# Patient Record
Sex: Male | Born: 1935 | Race: Black or African American | Hispanic: No | Smoking: Former smoker
Health system: Southern US, Community
[De-identification: ages and names within clinical notes are randomized; demographics above are authoritative.]

## PROBLEM LIST (undated history)

## (undated) DIAGNOSIS — I723 Aneurysm of iliac artery: Secondary | ICD-10-CM

## (undated) DIAGNOSIS — R55 Syncope and collapse: Secondary | ICD-10-CM

## (undated) DIAGNOSIS — J449 Chronic obstructive pulmonary disease, unspecified: Secondary | ICD-10-CM

## (undated) DIAGNOSIS — K219 Gastro-esophageal reflux disease without esophagitis: Secondary | ICD-10-CM

## (undated) DIAGNOSIS — N186 End stage renal disease: Secondary | ICD-10-CM

## (undated) DIAGNOSIS — C801 Malignant (primary) neoplasm, unspecified: Secondary | ICD-10-CM

## (undated) DIAGNOSIS — E785 Hyperlipidemia, unspecified: Secondary | ICD-10-CM

## (undated) DIAGNOSIS — I071 Rheumatic tricuspid insufficiency: Secondary | ICD-10-CM

## (undated) DIAGNOSIS — I10 Essential (primary) hypertension: Secondary | ICD-10-CM

## (undated) DIAGNOSIS — M6282 Rhabdomyolysis: Secondary | ICD-10-CM

## (undated) DIAGNOSIS — N2581 Secondary hyperparathyroidism of renal origin: Secondary | ICD-10-CM

## (undated) DIAGNOSIS — I8290 Acute embolism and thrombosis of unspecified vein: Secondary | ICD-10-CM

## (undated) DIAGNOSIS — E079 Disorder of thyroid, unspecified: Secondary | ICD-10-CM

## (undated) DIAGNOSIS — D649 Anemia, unspecified: Secondary | ICD-10-CM

## (undated) DIAGNOSIS — I469 Cardiac arrest, cause unspecified: Secondary | ICD-10-CM

## (undated) DIAGNOSIS — D693 Immune thrombocytopenic purpura: Secondary | ICD-10-CM

## (undated) DIAGNOSIS — I5022 Chronic systolic (congestive) heart failure: Secondary | ICD-10-CM

## (undated) DIAGNOSIS — Z992 Dependence on renal dialysis: Secondary | ICD-10-CM

## (undated) HISTORY — DX: Gastro-esophageal reflux disease without esophagitis: K21.9

## (undated) HISTORY — DX: Disorder of thyroid, unspecified: E07.9

## (undated) HISTORY — DX: Hyperlipidemia, unspecified: E78.5

## (undated) HISTORY — PX: OTHER SURGICAL HISTORY: SHX169

## (undated) HISTORY — DX: Chronic obstructive pulmonary disease, unspecified: J44.9

## (undated) HISTORY — DX: Rhabdomyolysis: M62.82

## (undated) HISTORY — DX: Anemia, unspecified: D64.9

## (undated) HISTORY — DX: Rheumatic tricuspid insufficiency: I07.1

## (undated) HISTORY — DX: Essential (primary) hypertension: I10

## (undated) HISTORY — DX: Malignant (primary) neoplasm, unspecified: C80.1

## (undated) HISTORY — DX: Chronic systolic (congestive) heart failure: I50.22

## (undated) HISTORY — DX: Acute embolism and thrombosis of unspecified vein: I82.90

## (undated) HISTORY — PX: HERNIA REPAIR: SHX51

## (undated) HISTORY — PX: AV FISTULA PLACEMENT: SHX1204

---

## 1975-01-20 HISTORY — PX: AMPUTATION: SHX166

## 1997-07-16 ENCOUNTER — Emergency Department (HOSPITAL_COMMUNITY): Admission: EM | Admit: 1997-07-16 | Discharge: 1997-07-16 | Payer: Self-pay | Admitting: Emergency Medicine

## 1997-07-19 ENCOUNTER — Encounter: Admission: RE | Admit: 1997-07-19 | Discharge: 1997-07-19 | Payer: Self-pay | Admitting: Internal Medicine

## 1997-07-31 ENCOUNTER — Encounter: Admission: RE | Admit: 1997-07-31 | Discharge: 1997-07-31 | Payer: Self-pay | Admitting: Hematology and Oncology

## 1997-08-08 ENCOUNTER — Ambulatory Visit (HOSPITAL_COMMUNITY): Admission: RE | Admit: 1997-08-08 | Discharge: 1997-08-08 | Payer: Self-pay | Admitting: *Deleted

## 1997-09-14 ENCOUNTER — Emergency Department (HOSPITAL_COMMUNITY): Admission: EM | Admit: 1997-09-14 | Discharge: 1997-09-14 | Payer: Self-pay | Admitting: Emergency Medicine

## 1997-09-18 ENCOUNTER — Encounter: Admission: RE | Admit: 1997-09-18 | Discharge: 1997-09-18 | Payer: Self-pay | Admitting: Internal Medicine

## 1997-11-23 ENCOUNTER — Ambulatory Visit (HOSPITAL_COMMUNITY): Admission: RE | Admit: 1997-11-23 | Discharge: 1997-11-23 | Payer: Self-pay | Admitting: Internal Medicine

## 1997-11-23 ENCOUNTER — Encounter: Admission: RE | Admit: 1997-11-23 | Discharge: 1997-11-23 | Payer: Self-pay | Admitting: Internal Medicine

## 1999-01-23 ENCOUNTER — Inpatient Hospital Stay (HOSPITAL_COMMUNITY): Admission: EM | Admit: 1999-01-23 | Discharge: 1999-01-24 | Payer: Self-pay | Admitting: *Deleted

## 1999-04-13 ENCOUNTER — Emergency Department (HOSPITAL_COMMUNITY): Admission: EM | Admit: 1999-04-13 | Discharge: 1999-04-13 | Payer: Self-pay

## 2001-05-30 ENCOUNTER — Emergency Department (HOSPITAL_COMMUNITY): Admission: EM | Admit: 2001-05-30 | Discharge: 2001-05-31 | Payer: Self-pay | Admitting: Emergency Medicine

## 2001-05-31 ENCOUNTER — Emergency Department (HOSPITAL_COMMUNITY): Admission: EM | Admit: 2001-05-31 | Discharge: 2001-05-31 | Payer: Self-pay | Admitting: Emergency Medicine

## 2001-11-14 ENCOUNTER — Emergency Department (HOSPITAL_COMMUNITY): Admission: EM | Admit: 2001-11-14 | Discharge: 2001-11-14 | Payer: Self-pay | Admitting: Emergency Medicine

## 2002-01-30 ENCOUNTER — Encounter: Payer: Self-pay | Admitting: Nephrology

## 2002-01-30 ENCOUNTER — Encounter: Admission: RE | Admit: 2002-01-30 | Discharge: 2002-01-30 | Payer: Self-pay | Admitting: Nephrology

## 2002-03-24 ENCOUNTER — Encounter (INDEPENDENT_AMBULATORY_CARE_PROVIDER_SITE_OTHER): Payer: Self-pay | Admitting: *Deleted

## 2002-03-24 ENCOUNTER — Ambulatory Visit (HOSPITAL_COMMUNITY): Admission: RE | Admit: 2002-03-24 | Discharge: 2002-03-25 | Payer: Self-pay

## 2002-04-02 ENCOUNTER — Emergency Department (HOSPITAL_COMMUNITY): Admission: EM | Admit: 2002-04-02 | Discharge: 2002-04-02 | Payer: Self-pay | Admitting: Emergency Medicine

## 2002-04-03 ENCOUNTER — Emergency Department (HOSPITAL_COMMUNITY): Admission: EM | Admit: 2002-04-03 | Discharge: 2002-04-03 | Payer: Self-pay | Admitting: Emergency Medicine

## 2002-06-07 ENCOUNTER — Emergency Department (HOSPITAL_COMMUNITY): Admission: EM | Admit: 2002-06-07 | Discharge: 2002-06-07 | Payer: Self-pay | Admitting: Emergency Medicine

## 2003-02-26 ENCOUNTER — Encounter: Admission: RE | Admit: 2003-02-26 | Discharge: 2003-05-27 | Payer: Self-pay | Admitting: Nephrology

## 2003-08-17 ENCOUNTER — Ambulatory Visit (HOSPITAL_COMMUNITY): Admission: RE | Admit: 2003-08-17 | Discharge: 2003-08-17 | Payer: Self-pay | Admitting: Vascular Surgery

## 2003-08-17 ENCOUNTER — Emergency Department (HOSPITAL_COMMUNITY): Admission: EM | Admit: 2003-08-17 | Discharge: 2003-08-17 | Payer: Self-pay | Admitting: Family Medicine

## 2003-08-22 ENCOUNTER — Ambulatory Visit (HOSPITAL_COMMUNITY): Admission: RE | Admit: 2003-08-22 | Discharge: 2003-08-22 | Payer: Self-pay | Admitting: Vascular Surgery

## 2003-10-19 ENCOUNTER — Emergency Department (HOSPITAL_COMMUNITY): Admission: EM | Admit: 2003-10-19 | Discharge: 2003-10-19 | Payer: Self-pay | Admitting: Emergency Medicine

## 2003-12-04 ENCOUNTER — Ambulatory Visit: Payer: Self-pay | Admitting: Gastroenterology

## 2003-12-19 ENCOUNTER — Ambulatory Visit: Payer: Self-pay | Admitting: Gastroenterology

## 2003-12-24 ENCOUNTER — Ambulatory Visit: Payer: Self-pay | Admitting: Gastroenterology

## 2004-02-24 ENCOUNTER — Inpatient Hospital Stay (HOSPITAL_COMMUNITY): Admission: EM | Admit: 2004-02-24 | Discharge: 2004-02-26 | Payer: Self-pay | Admitting: Emergency Medicine

## 2004-03-04 ENCOUNTER — Ambulatory Visit: Payer: Self-pay | Admitting: Gastroenterology

## 2004-04-08 ENCOUNTER — Emergency Department (HOSPITAL_COMMUNITY): Admission: EM | Admit: 2004-04-08 | Discharge: 2004-04-08 | Payer: Self-pay | Admitting: Emergency Medicine

## 2004-07-04 ENCOUNTER — Ambulatory Visit: Payer: Self-pay | Admitting: Gastroenterology

## 2005-03-24 ENCOUNTER — Emergency Department (HOSPITAL_COMMUNITY): Admission: EM | Admit: 2005-03-24 | Discharge: 2005-03-24 | Payer: Self-pay | Admitting: Family Medicine

## 2005-04-28 ENCOUNTER — Encounter: Admission: RE | Admit: 2005-04-28 | Discharge: 2005-04-28 | Payer: Self-pay | Admitting: Internal Medicine

## 2005-07-02 ENCOUNTER — Encounter (HOSPITAL_COMMUNITY): Admission: RE | Admit: 2005-07-02 | Discharge: 2005-09-30 | Payer: Self-pay | Admitting: Nephrology

## 2005-07-21 ENCOUNTER — Emergency Department (HOSPITAL_COMMUNITY): Admission: EM | Admit: 2005-07-21 | Discharge: 2005-07-21 | Payer: Self-pay | Admitting: Emergency Medicine

## 2005-08-04 ENCOUNTER — Encounter: Admission: RE | Admit: 2005-08-04 | Discharge: 2005-08-04 | Payer: Self-pay | Admitting: Internal Medicine

## 2005-09-10 ENCOUNTER — Emergency Department (HOSPITAL_COMMUNITY): Admission: EM | Admit: 2005-09-10 | Discharge: 2005-09-10 | Payer: Self-pay | Admitting: Family Medicine

## 2005-11-10 ENCOUNTER — Inpatient Hospital Stay (HOSPITAL_COMMUNITY): Admission: EM | Admit: 2005-11-10 | Discharge: 2005-11-12 | Payer: Self-pay | Admitting: Emergency Medicine

## 2005-11-11 ENCOUNTER — Encounter (INDEPENDENT_AMBULATORY_CARE_PROVIDER_SITE_OTHER): Payer: Self-pay | Admitting: Specialist

## 2005-11-18 ENCOUNTER — Emergency Department (HOSPITAL_COMMUNITY): Admission: EM | Admit: 2005-11-18 | Discharge: 2005-11-18 | Payer: Self-pay | Admitting: Emergency Medicine

## 2005-12-08 ENCOUNTER — Emergency Department (HOSPITAL_COMMUNITY): Admission: EM | Admit: 2005-12-08 | Discharge: 2005-12-08 | Payer: Self-pay | Admitting: Family Medicine

## 2005-12-09 ENCOUNTER — Emergency Department (HOSPITAL_COMMUNITY): Admission: EM | Admit: 2005-12-09 | Discharge: 2005-12-09 | Payer: Self-pay | Admitting: Family Medicine

## 2005-12-15 ENCOUNTER — Ambulatory Visit: Payer: Self-pay | Admitting: Gastroenterology

## 2005-12-22 ENCOUNTER — Ambulatory Visit: Payer: Self-pay | Admitting: Gastroenterology

## 2006-02-04 ENCOUNTER — Ambulatory Visit: Payer: Self-pay | Admitting: Gastroenterology

## 2006-05-06 ENCOUNTER — Emergency Department (HOSPITAL_COMMUNITY): Admission: EM | Admit: 2006-05-06 | Discharge: 2006-05-06 | Payer: Self-pay | Admitting: Family Medicine

## 2006-12-03 ENCOUNTER — Emergency Department (HOSPITAL_COMMUNITY): Admission: EM | Admit: 2006-12-03 | Discharge: 2006-12-03 | Payer: Self-pay | Admitting: Family Medicine

## 2006-12-07 ENCOUNTER — Ambulatory Visit (HOSPITAL_COMMUNITY): Admission: RE | Admit: 2006-12-07 | Discharge: 2006-12-07 | Payer: Self-pay | Admitting: Nephrology

## 2006-12-22 ENCOUNTER — Emergency Department (HOSPITAL_COMMUNITY): Admission: EM | Admit: 2006-12-22 | Discharge: 2006-12-22 | Payer: Self-pay | Admitting: Emergency Medicine

## 2006-12-24 ENCOUNTER — Emergency Department (HOSPITAL_COMMUNITY): Admission: EM | Admit: 2006-12-24 | Discharge: 2006-12-24 | Payer: Self-pay | Admitting: Emergency Medicine

## 2007-01-20 ENCOUNTER — Emergency Department (HOSPITAL_COMMUNITY): Admission: EM | Admit: 2007-01-20 | Discharge: 2007-01-20 | Payer: Self-pay | Admitting: Emergency Medicine

## 2007-03-01 ENCOUNTER — Ambulatory Visit: Payer: Self-pay | Admitting: Dentistry

## 2007-03-01 ENCOUNTER — Encounter: Admission: EM | Admit: 2007-03-01 | Discharge: 2007-03-01 | Payer: Self-pay | Admitting: Dentistry

## 2007-03-17 ENCOUNTER — Ambulatory Visit: Payer: Self-pay | Admitting: Cardiovascular Disease

## 2007-04-28 ENCOUNTER — Encounter: Payer: Self-pay | Admitting: Cardiovascular Disease

## 2007-04-28 ENCOUNTER — Ambulatory Visit: Payer: Self-pay

## 2007-05-05 ENCOUNTER — Ambulatory Visit: Payer: Self-pay | Admitting: Cardiology

## 2007-05-05 ENCOUNTER — Inpatient Hospital Stay (HOSPITAL_COMMUNITY): Admission: EM | Admit: 2007-05-05 | Discharge: 2007-05-08 | Payer: Self-pay | Admitting: Emergency Medicine

## 2007-05-06 ENCOUNTER — Encounter (INDEPENDENT_AMBULATORY_CARE_PROVIDER_SITE_OTHER): Payer: Self-pay | Admitting: Internal Medicine

## 2007-05-06 ENCOUNTER — Ambulatory Visit: Payer: Self-pay | Admitting: Vascular Surgery

## 2007-05-25 ENCOUNTER — Ambulatory Visit: Payer: Self-pay | Admitting: Internal Medicine

## 2007-07-01 ENCOUNTER — Ambulatory Visit: Payer: Self-pay | Admitting: Cardiovascular Disease

## 2007-08-15 ENCOUNTER — Emergency Department (HOSPITAL_COMMUNITY): Admission: EM | Admit: 2007-08-15 | Discharge: 2007-08-15 | Payer: Self-pay | Admitting: Emergency Medicine

## 2007-10-04 ENCOUNTER — Observation Stay (HOSPITAL_COMMUNITY): Admission: EM | Admit: 2007-10-04 | Discharge: 2007-10-05 | Payer: Self-pay | Admitting: Emergency Medicine

## 2007-11-27 ENCOUNTER — Emergency Department (HOSPITAL_COMMUNITY): Admission: EM | Admit: 2007-11-27 | Discharge: 2007-11-27 | Payer: Self-pay | Admitting: Emergency Medicine

## 2007-12-04 ENCOUNTER — Emergency Department (HOSPITAL_COMMUNITY): Admission: EM | Admit: 2007-12-04 | Discharge: 2007-12-04 | Payer: Self-pay | Admitting: Family Medicine

## 2007-12-09 ENCOUNTER — Ambulatory Visit: Payer: Self-pay | Admitting: Cardiology

## 2007-12-09 DIAGNOSIS — I469 Cardiac arrest, cause unspecified: Secondary | ICD-10-CM

## 2007-12-09 HISTORY — DX: Cardiac arrest, cause unspecified: I46.9

## 2007-12-10 ENCOUNTER — Inpatient Hospital Stay (HOSPITAL_COMMUNITY): Admission: EM | Admit: 2007-12-10 | Discharge: 2007-12-18 | Payer: Self-pay | Admitting: *Deleted

## 2007-12-10 ENCOUNTER — Ambulatory Visit: Payer: Self-pay | Admitting: Internal Medicine

## 2007-12-10 ENCOUNTER — Ambulatory Visit: Payer: Self-pay | Admitting: Cardiology

## 2007-12-11 ENCOUNTER — Encounter (INDEPENDENT_AMBULATORY_CARE_PROVIDER_SITE_OTHER): Payer: Self-pay | Admitting: Internal Medicine

## 2007-12-12 ENCOUNTER — Encounter (INDEPENDENT_AMBULATORY_CARE_PROVIDER_SITE_OTHER): Payer: Self-pay | Admitting: Internal Medicine

## 2007-12-13 ENCOUNTER — Ambulatory Visit: Payer: Self-pay | Admitting: Surgery

## 2007-12-19 ENCOUNTER — Ambulatory Visit (HOSPITAL_COMMUNITY): Admission: RE | Admit: 2007-12-19 | Discharge: 2007-12-19 | Payer: Self-pay | Admitting: Urology

## 2008-01-07 ENCOUNTER — Inpatient Hospital Stay (HOSPITAL_COMMUNITY): Admission: EM | Admit: 2008-01-07 | Discharge: 2008-01-10 | Payer: Self-pay | Admitting: Emergency Medicine

## 2008-01-26 ENCOUNTER — Emergency Department (HOSPITAL_COMMUNITY): Admission: EM | Admit: 2008-01-26 | Discharge: 2008-01-26 | Payer: Self-pay | Admitting: Emergency Medicine

## 2008-02-10 ENCOUNTER — Ambulatory Visit: Payer: Self-pay | Admitting: Cardiovascular Disease

## 2008-02-15 ENCOUNTER — Ambulatory Visit (HOSPITAL_COMMUNITY): Admission: RE | Admit: 2008-02-15 | Discharge: 2008-02-15 | Payer: Self-pay | Admitting: Nephrology

## 2008-03-21 ENCOUNTER — Ambulatory Visit: Payer: Self-pay | Admitting: Cardiovascular Disease

## 2008-05-28 ENCOUNTER — Telehealth: Payer: Self-pay | Admitting: Cardiovascular Disease

## 2008-10-09 ENCOUNTER — Ambulatory Visit: Payer: Self-pay | Admitting: Internal Medicine

## 2008-10-09 ENCOUNTER — Inpatient Hospital Stay (HOSPITAL_COMMUNITY): Admission: EM | Admit: 2008-10-09 | Discharge: 2008-10-13 | Payer: Self-pay | Admitting: Emergency Medicine

## 2008-10-12 ENCOUNTER — Encounter (INDEPENDENT_AMBULATORY_CARE_PROVIDER_SITE_OTHER): Payer: Self-pay | Admitting: Internal Medicine

## 2008-11-16 ENCOUNTER — Ambulatory Visit (HOSPITAL_COMMUNITY): Admission: RE | Admit: 2008-11-16 | Discharge: 2008-11-16 | Payer: Self-pay | Admitting: Nephrology

## 2008-11-25 ENCOUNTER — Emergency Department (HOSPITAL_COMMUNITY): Admission: EM | Admit: 2008-11-25 | Discharge: 2008-11-25 | Payer: Self-pay | Admitting: Family Medicine

## 2008-12-11 ENCOUNTER — Emergency Department (HOSPITAL_COMMUNITY): Admission: EM | Admit: 2008-12-11 | Discharge: 2008-12-11 | Payer: Self-pay | Admitting: Emergency Medicine

## 2008-12-11 ENCOUNTER — Ambulatory Visit: Payer: Self-pay | Admitting: Vascular Surgery

## 2009-02-22 ENCOUNTER — Ambulatory Visit: Payer: Self-pay | Admitting: Vascular Surgery

## 2009-02-27 ENCOUNTER — Ambulatory Visit (HOSPITAL_COMMUNITY): Admission: RE | Admit: 2009-02-27 | Discharge: 2009-02-27 | Payer: Self-pay | Admitting: Vascular Surgery

## 2009-03-21 ENCOUNTER — Ambulatory Visit: Payer: Self-pay | Admitting: Vascular Surgery

## 2009-03-31 ENCOUNTER — Emergency Department (HOSPITAL_COMMUNITY): Admission: EM | Admit: 2009-03-31 | Discharge: 2009-03-31 | Payer: Self-pay | Admitting: Emergency Medicine

## 2009-07-04 ENCOUNTER — Encounter: Payer: Self-pay | Admitting: Cardiovascular Disease

## 2009-07-08 ENCOUNTER — Ambulatory Visit (HOSPITAL_COMMUNITY): Admission: RE | Admit: 2009-07-08 | Discharge: 2009-07-08 | Payer: Self-pay | Admitting: Nephrology

## 2009-07-11 ENCOUNTER — Encounter: Payer: Self-pay | Admitting: Cardiovascular Disease

## 2009-07-15 ENCOUNTER — Encounter: Payer: Self-pay | Admitting: Cardiovascular Disease

## 2009-07-19 ENCOUNTER — Encounter: Payer: Self-pay | Admitting: Cardiovascular Disease

## 2009-07-26 ENCOUNTER — Emergency Department (HOSPITAL_COMMUNITY): Admission: EM | Admit: 2009-07-26 | Discharge: 2009-07-26 | Payer: Self-pay | Admitting: Emergency Medicine

## 2009-09-06 ENCOUNTER — Ambulatory Visit: Payer: Self-pay | Admitting: Cardiovascular Disease

## 2009-09-06 DIAGNOSIS — I5022 Chronic systolic (congestive) heart failure: Secondary | ICD-10-CM

## 2009-09-06 HISTORY — DX: Chronic systolic (congestive) heart failure: I50.22

## 2009-09-10 ENCOUNTER — Emergency Department (HOSPITAL_COMMUNITY): Admission: EM | Admit: 2009-09-10 | Discharge: 2009-09-10 | Payer: Self-pay | Admitting: Emergency Medicine

## 2009-09-13 ENCOUNTER — Encounter: Admission: RE | Admit: 2009-09-13 | Discharge: 2009-09-13 | Payer: Self-pay | Admitting: Sports Medicine

## 2009-10-08 ENCOUNTER — Ambulatory Visit: Payer: Self-pay | Admitting: Vascular Surgery

## 2009-10-19 HISTORY — PX: OTHER SURGICAL HISTORY: SHX169

## 2009-10-21 ENCOUNTER — Encounter: Admission: RE | Admit: 2009-10-21 | Discharge: 2009-10-21 | Payer: Self-pay | Admitting: Vascular Surgery

## 2009-10-24 ENCOUNTER — Emergency Department (HOSPITAL_COMMUNITY): Admission: EM | Admit: 2009-10-24 | Discharge: 2009-10-24 | Payer: Self-pay | Admitting: Emergency Medicine

## 2009-10-30 ENCOUNTER — Ambulatory Visit (HOSPITAL_COMMUNITY): Admission: RE | Admit: 2009-10-30 | Discharge: 2009-10-30 | Payer: Self-pay | Admitting: Surgery

## 2009-11-02 ENCOUNTER — Ambulatory Visit: Payer: Self-pay | Admitting: Cardiovascular Disease

## 2009-11-02 ENCOUNTER — Observation Stay (HOSPITAL_COMMUNITY): Admission: EM | Admit: 2009-11-02 | Discharge: 2009-11-04 | Payer: Self-pay | Admitting: Emergency Medicine

## 2009-11-03 ENCOUNTER — Encounter (INDEPENDENT_AMBULATORY_CARE_PROVIDER_SITE_OTHER): Payer: Self-pay | Admitting: Internal Medicine

## 2009-11-06 ENCOUNTER — Inpatient Hospital Stay (HOSPITAL_COMMUNITY): Admission: RE | Admit: 2009-11-06 | Discharge: 2009-11-07 | Payer: Self-pay | Admitting: Vascular Surgery

## 2009-11-06 ENCOUNTER — Ambulatory Visit: Payer: Self-pay | Admitting: Vascular Surgery

## 2009-11-22 ENCOUNTER — Ambulatory Visit (HOSPITAL_COMMUNITY): Admission: RE | Admit: 2009-11-22 | Discharge: 2009-11-22 | Payer: Self-pay | Admitting: Nephrology

## 2009-12-09 ENCOUNTER — Encounter: Admission: RE | Admit: 2009-12-09 | Discharge: 2009-12-09 | Payer: Self-pay | Admitting: Vascular Surgery

## 2009-12-10 ENCOUNTER — Ambulatory Visit: Payer: Self-pay | Admitting: Vascular Surgery

## 2009-12-12 ENCOUNTER — Emergency Department (HOSPITAL_COMMUNITY): Admission: EM | Admit: 2009-12-12 | Discharge: 2009-12-12 | Payer: Self-pay | Admitting: Emergency Medicine

## 2009-12-18 ENCOUNTER — Ambulatory Visit: Payer: Self-pay | Admitting: Vascular Surgery

## 2009-12-23 ENCOUNTER — Inpatient Hospital Stay (HOSPITAL_COMMUNITY)
Admission: EM | Admit: 2009-12-23 | Discharge: 2009-12-27 | Payer: Self-pay | Source: Home / Self Care | Attending: Internal Medicine | Admitting: Internal Medicine

## 2010-01-03 ENCOUNTER — Ambulatory Visit (HOSPITAL_COMMUNITY): Admission: RE | Admit: 2010-01-03 | Payer: Self-pay | Source: Home / Self Care | Admitting: Vascular Surgery

## 2010-01-24 ENCOUNTER — Encounter
Admission: RE | Admit: 2010-01-24 | Discharge: 2010-01-24 | Payer: Self-pay | Source: Home / Self Care | Attending: Nephrology | Admitting: Nephrology

## 2010-02-04 ENCOUNTER — Ambulatory Visit
Admission: RE | Admit: 2010-02-04 | Discharge: 2010-02-04 | Payer: Self-pay | Source: Home / Self Care | Attending: Vascular Surgery | Admitting: Vascular Surgery

## 2010-02-09 ENCOUNTER — Encounter: Payer: Self-pay | Admitting: Nephrology

## 2010-02-09 ENCOUNTER — Encounter: Payer: Self-pay | Admitting: Urology

## 2010-02-18 ENCOUNTER — Ambulatory Visit
Admission: RE | Admit: 2010-02-18 | Discharge: 2010-02-18 | Payer: Self-pay | Source: Home / Self Care | Attending: Vascular Surgery | Admitting: Vascular Surgery

## 2010-02-18 NOTE — Letter (Signed)
Summary: [facility] Hemodialysis Progress Note   [facility] Hemodialysis Progress Note   Imported By: Roderic Ovens 09/13/2009 12:00:18  _____________________________________________________________________  External Attachment:    Type:   Image     Comment:   External Document

## 2010-02-18 NOTE — Assessment & Plan Note (Signed)
Summary: f1y per pt call/lg   Visit Type:  Follow-up  CC:  none.  History of Present Illness: 77 year-old gentleman with ESRD, multiple comorbidities, presents for follow-up evaluation of cardiomyopathy with LVEF less than 30%. He does have a history of ventricular tachycardia occuring in the setting of marked hyperkalemia.   He is doing well at present. The patient denies chest pain, dyspnea, orthopnea, PND, edema, palpitations, lightheadedness, or syncope. He maintains a garden and a reasonably active lifestyle. He denies syncope in the past 12 months. He states that he is tolerating hemodialysis without problems.   Current Medications (verified): 1)  Coreg 12.5 Mg Tabs (Carvedilol) .... Take 1 Tablet By Mouth Two Times A Day 2)  Isosorbide Mononitrate Cr 30 Mg Xr24h-Tab (Isosorbide Mononitrate) .... Take One Tablet By Mouth Daily 3)  Omeprazole 40 Mg Cpdr (Omeprazole) .... Take 1 Tab By Mouth At Bedtime 4)  Proscar 5 Mg Tabs (Finasteride) .... Take 1 Tablet By Mouth Two Times A Day 5)  Thorazine 25mg  .... As Needed  Hiccups. 6)  Rena-Vite  Tabs (B Complex-C-Folic Acid) .... Take 1 Tablet By Mouth Once A Day 7)  Aspirin 81 Mg Tbec (Aspirin) .... Take One Tablet By Mouth Daily  Allergies (verified): No Known Drug Allergies  Past History:  Past medical history reviewed for relevance to current acute and chronic problems.  Past Medical History: Reviewed history from 09/04/2009 and no changes required.  1. Rhabdomyolysis secondary to blunt trauma to the buttocks area after       a fall.   2. End-stage renal disease, on hemodialysis Tuesday, Thursday,       Saturdays.   3. Ischemic cardiomyopathy, ejection fraction 25%-30%.   4. Essential hypertension.   5. Hyperlipidemia.   6. Secondary hyperparathyroidism.   7. Anemia of chronic disease.   8. Gout.   9. Mild tricuspid regurgitation.   10.History of rectal bleeding.   11.History of apical thrombus.   12.History of  hyperkalemic related cardiac arrest.   13.Left arteriovenous fistula.   Review of Systems       Negative except as per HPI   Vital Signs:  Patient profile:   77 year old male Height:      67 inches Weight:      155 pounds BMI:     24.36 Pulse rate:   70 / minute Resp:     16 per minute BP sitting:   118 / 78  (right arm)  Vitals Entered By: Marrion Coy, CNA (September 06, 2009 9:55 AM)  Physical Exam  General:  Pt is alert and oriented, elderly, African-American male in no acute distress. HEENT: normal Neck: normal carotid upstrokes without bruits, JVP normal Lungs: CTA CV: RRR without murmur or gallop Abd: soft, NT, positive BS, no bruit, no organomegaly Ext: no clubbing, cyanosis, or edema. peripheral pulses 2+ and equal Skin: warm and dry without rash    EKG  Procedure date:  09/06/2009  Findings:      NSR with first degree AVB, age-indeterminate inferior and anterior infarcts, PVC. HR 70 bpm.  Impression & Recommendations:  Problem # 1:  CHRONIC SYSTOLIC HEART FAILURE (ICD-428.22) Pt is stable from a CV perspective. He has severe LV dysfunction, with an absence of cardiac symptoms at present (Class I). I suspect he would be symptomatic if activity level was higher. With ESRD and at age 35, I would continue with medical therapy using carvedilol/isosorbide and volume control with hemodialysis. I'll see him back in one  year for follow-up evaluation.  His updated medication list for this problem includes:    Coreg 12.5 Mg Tabs (Carvedilol) .Marland Kitchen... Take 1 tablet by mouth two times a day    Isosorbide Mononitrate Cr 30 Mg Xr24h-tab (Isosorbide mononitrate) .Marland Kitchen... Take one tablet by mouth daily    Aspirin 81 Mg Tbec (Aspirin) .Marland Kitchen... Take one tablet by mouth daily  Orders: EKG w/ Interpretation (93000)  Patient Instructions: 1)  Your physician recommends that you schedule a follow-up appointment in: 12 months with Dr Excell Seltzer 2)  Your physician recommends that you continue  on your current medications as directed. Please refer to the Current Medication list given to you today.

## 2010-02-18 NOTE — Letter (Signed)
Summary: [facility] Problem List   [facility] Problem List   Imported By: Roderic Ovens 09/13/2009 11:56:22  _____________________________________________________________________  External Attachment:    Type:   Image     Comment:   External Document

## 2010-02-19 NOTE — Assessment & Plan Note (Signed)
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  I ligated the fistula in the forearm on December 6, and we created a left upper arm fistula which has matured nicely.  It has an excellent pulse and palpable thrill.  I did evacuate by compression a bunch of old laminated clot and debris from the distal most aneurysm in the forearm today.  The more proximal one has no ulceration and is intact.  I think that this should drain some old blood and then eventually hopefully this will heal without requiring a trip to the operating room to excise this area.  I discussed this the patient. He is in agreement with this plan.  We have reapplied a compression dressing today which will be changed in his dialysis sessions.  I cautioned him that it might ooze a little dark blood but not to be concerned about this but to keep a compression dressing in place.  He will return in 4 weeks for me to see this unless he develops any chills, fever or evidence of infection.    [name], M.D. Electronically Signed  JDL/MEDQ  D:  02/18/2010  T:  02/19/2010  Job:  [id number]

## 2010-03-18 ENCOUNTER — Ambulatory Visit (INDEPENDENT_AMBULATORY_CARE_PROVIDER_SITE_OTHER): Payer: Medicare Other | Admitting: Vascular Surgery

## 2010-03-18 DIAGNOSIS — N186 End stage renal disease: Secondary | ICD-10-CM

## 2010-03-19 NOTE — Assessment & Plan Note (Signed)
OFFICE VISIT  Jackson [name], Jackson [name] DOB:  March 23, 1928                                       03/18/2010 NWGNF#:[id number]  The patient had ligation of a left forearm AV fistula for pseudoaneurysm erosion and then creation of a left upper arm AV fistula performed on December 6 for end-stage renal disease.  The left upper arm fistula has been utilized for the last few weeks and is working well.  The wound in the forearm is continuing to stay dry and clean and he is treating this with just local wound care daily.  He has no symptoms of numbness or pain in the left hand.  On exam, the upper arm fistula has an excellent pulse palpable thrill, and the area in the mid forearm where there was a pseudoaneurysm has had an eschar overlying it measuring about 5 mm in diameter, which is clean and uninfected.  I have encouraged him to continue to keep this clean and the eschar should separate soon.  If he has any further concerns about the wound, he will be in touch with [place] for further follow-up.    [name], M.D. Electronically Signed  JDL/MEDQ  D:  03/18/2010  T:  03/19/2010  Job:  [id number]

## 2010-03-27 ENCOUNTER — Other Ambulatory Visit: Payer: Self-pay | Admitting: Nephrology

## 2010-03-27 ENCOUNTER — Ambulatory Visit
Admission: RE | Admit: 2010-03-27 | Discharge: 2010-03-27 | Disposition: A | Discharge status: Home / Self Care | Payer: Medicare Other | Source: Ambulatory Visit | Attending: Nephrology | Admitting: Nephrology

## 2010-03-27 DIAGNOSIS — I509 Heart failure, unspecified: Secondary | ICD-10-CM

## 2010-03-28 ENCOUNTER — Other Ambulatory Visit (HOSPITAL_COMMUNITY): Payer: Self-pay | Admitting: Nephrology

## 2010-03-28 DIAGNOSIS — N186 End stage renal disease: Secondary | ICD-10-CM

## 2010-03-31 LAB — CROSSMATCH
Antibody Screen: NEGATIVE
Unit division: 0
Unit division: 0
Unit division: 0

## 2010-03-31 LAB — CBC
HCT: 20.1 % — ABNORMAL LOW (ref 39.0–52.0)
HCT: 25.3 % — ABNORMAL LOW (ref 39.0–52.0)
MCH: 29.7 pg (ref 26.0–34.0)
MCH: 30.3 pg (ref 26.0–34.0)
MCH: 30.6 pg (ref 26.0–34.0)
MCH: 30.8 pg (ref 26.0–34.0)
MCHC: 31.3 g/dL (ref 30.0–36.0)
MCHC: 32.8 g/dL (ref 30.0–36.0)
MCHC: 33.9 g/dL (ref 30.0–36.0)
MCV: 92.2 fL (ref 78.0–100.0)
MCV: 92.2 fL (ref 78.0–100.0)
MCV: 92.3 fL (ref 78.0–100.0)
MCV: 96.2 fL (ref 78.0–100.0)
Platelets: 55 10*3/uL — ABNORMAL LOW (ref 150–400)
Platelets: 55 10*3/uL — ABNORMAL LOW (ref 150–400)
Platelets: 58 10*3/uL — ABNORMAL LOW (ref 150–400)
Platelets: 65 10*3/uL — ABNORMAL LOW (ref 150–400)
Platelets: 72 10*3/uL — ABNORMAL LOW (ref 150–400)
Platelets: 84 10*3/uL — ABNORMAL LOW (ref 150–400)
Platelets: 88 10*3/uL — ABNORMAL LOW (ref 150–400)
RBC: 2.63 MIL/uL — ABNORMAL LOW (ref 4.22–5.81)
RDW: 17.3 % — ABNORMAL HIGH (ref 11.5–15.5)
RDW: 17.8 % — ABNORMAL HIGH (ref 11.5–15.5)
RDW: 18 % — ABNORMAL HIGH (ref 11.5–15.5)
RDW: 18.1 % — ABNORMAL HIGH (ref 11.5–15.5)
RDW: 18.1 % — ABNORMAL HIGH (ref 11.5–15.5)
RDW: 20.9 % — ABNORMAL HIGH (ref 11.5–15.5)
WBC: 4.6 10*3/uL (ref 4.0–10.5)
WBC: 5 10*3/uL (ref 4.0–10.5)
WBC: 5.2 10*3/uL (ref 4.0–10.5)
WBC: 5.6 10*3/uL (ref 4.0–10.5)

## 2010-03-31 LAB — BASIC METABOLIC PANEL
BUN: 10 mg/dL (ref 6–23)
BUN: 13 mg/dL (ref 6–23)
BUN: 19 mg/dL (ref 6–23)
CO2: 26 mEq/L (ref 19–32)
Calcium: 7.9 mg/dL — ABNORMAL LOW (ref 8.4–10.5)
Chloride: 100 mEq/L (ref 96–112)
Chloride: 99 mEq/L (ref 96–112)
Creatinine, Ser: 4.77 mg/dL — ABNORMAL HIGH (ref 0.4–1.5)
Creatinine, Ser: 7.33 mg/dL — ABNORMAL HIGH (ref 0.4–1.5)
Creatinine, Ser: 7.35 mg/dL — ABNORMAL HIGH (ref 0.4–1.5)
GFR calc Af Amer: 9 mL/min — ABNORMAL LOW (ref >60)
GFR calc non Af Amer: 12 mL/min — ABNORMAL LOW (ref >60)
Potassium: 4 mEq/L (ref 3.5–5.1)

## 2010-03-31 LAB — DIFFERENTIAL
Basophils Absolute: 0.1 10*3/uL (ref 0.0–0.1)
Eosinophils Absolute: 0.2 10*3/uL (ref 0.0–0.7)
Eosinophils Absolute: 0.2 10*3/uL (ref 0.0–0.7)
Eosinophils Relative: 3 % (ref 0–5)
Lymphocytes Relative: 22 % (ref 12–46)
Lymphocytes Relative: 25 % (ref 12–46)
Lymphs Abs: 1.3 10*3/uL (ref 0.7–4.0)
Lymphs Abs: 1.6 10*3/uL (ref 0.7–4.0)
Monocytes Absolute: 0.7 10*3/uL (ref 0.1–1.0)
Neutro Abs: 2.9 10*3/uL (ref 1.7–7.7)

## 2010-03-31 LAB — PROTIME-INR: INR: 1.22 (ref 0.00–1.49)

## 2010-03-31 LAB — MRSA PCR SCREENING: MRSA by PCR: NEGATIVE

## 2010-03-31 LAB — RENAL FUNCTION PANEL
Albumin: 2.1 g/dL — ABNORMAL LOW (ref 3.5–5.2)
Albumin: 2.3 g/dL — ABNORMAL LOW (ref 3.5–5.2)
BUN: 14 mg/dL (ref 6–23)
CO2: 26 mEq/L (ref 19–32)
Calcium: 7.7 mg/dL — ABNORMAL LOW (ref 8.4–10.5)
Calcium: 8.2 mg/dL — ABNORMAL LOW (ref 8.4–10.5)
Creatinine, Ser: 9.33 mg/dL — ABNORMAL HIGH (ref 0.4–1.5)
GFR calc Af Amer: 7 mL/min — ABNORMAL LOW (ref >60)
GFR calc non Af Amer: 5 mL/min — ABNORMAL LOW (ref >60)
Glucose, Bld: 92 mg/dL (ref 70–99)
Phosphorus: 1.6 mg/dL — ABNORMAL LOW (ref 2.3–4.6)
Phosphorus: 2.5 mg/dL (ref 2.3–4.6)

## 2010-03-31 LAB — POCT I-STAT, CHEM 8
BUN: 14 mg/dL (ref 6–23)
Calcium, Ion: 1.13 mmol/L (ref 1.12–1.32)
Creatinine, Ser: 6.9 mg/dL — ABNORMAL HIGH (ref 0.4–1.5)
Glucose, Bld: 97 mg/dL (ref 70–99)
Hemoglobin: 9.5 g/dL — ABNORMAL LOW (ref 13.0–17.0)
Sodium: 137 mEq/L (ref 135–145)
TCO2: 30 mmol/L (ref 0–100)

## 2010-03-31 LAB — POTASSIUM: Potassium: 4 mEq/L (ref 3.5–5.1)

## 2010-04-01 LAB — CBC
HCT: 31.1 % — ABNORMAL LOW (ref 39.0–52.0)
Hemoglobin: 9.9 g/dL — ABNORMAL LOW (ref 13.0–17.0)
RDW: 20 % — ABNORMAL HIGH (ref 11.5–15.5)
WBC: 7.4 10*3/uL (ref 4.0–10.5)

## 2010-04-01 LAB — POCT CARDIAC MARKERS: Troponin i, poc: <0.05 ng/mL (ref 0.00–0.09)

## 2010-04-01 LAB — DIFFERENTIAL
Basophils Absolute: 0 10*3/uL (ref 0.0–0.1)
Eosinophils Relative: 1 % (ref 0–5)
Lymphocytes Relative: 20 % (ref 12–46)
Monocytes Absolute: 0.8 10*3/uL (ref 0.1–1.0)

## 2010-04-01 LAB — BASIC METABOLIC PANEL
GFR calc non Af Amer: 7 mL/min — ABNORMAL LOW (ref >60)
Potassium: 3.3 mEq/L — ABNORMAL LOW (ref 3.5–5.1)
Sodium: 134 mEq/L — ABNORMAL LOW (ref 135–145)

## 2010-04-02 LAB — CBC
HCT: 24 % — ABNORMAL LOW (ref 39.0–52.0)
HCT: 28.1 % — ABNORMAL LOW (ref 39.0–52.0)
HCT: 28.4 % — ABNORMAL LOW (ref 39.0–52.0)
Hemoglobin: 7.3 g/dL — ABNORMAL LOW (ref 13.0–17.0)
Hemoglobin: 8 g/dL — ABNORMAL LOW (ref 13.0–17.0)
Hemoglobin: 8.6 g/dL — ABNORMAL LOW (ref 13.0–17.0)
Hemoglobin: 9.3 g/dL — ABNORMAL LOW (ref 13.0–17.0)
MCH: 30.7 pg (ref 26.0–34.0)
MCH: 31 pg (ref 26.0–34.0)
MCHC: 33.1 g/dL (ref 30.0–36.0)
MCHC: 33.3 g/dL (ref 30.0–36.0)
MCV: 92.2 fL (ref 78.0–100.0)
MCV: 93 fL (ref 78.0–100.0)
MCV: 95.9 fL (ref 78.0–100.0)
Platelets: 114 10*3/uL — ABNORMAL LOW (ref 150–400)
Platelets: 129 10*3/uL — ABNORMAL LOW (ref 150–400)
RBC: 2.31 MIL/uL — ABNORMAL LOW (ref 4.22–5.81)
RBC: 2.58 MIL/uL — ABNORMAL LOW (ref 4.22–5.81)
RBC: 2.73 MIL/uL — ABNORMAL LOW (ref 4.22–5.81)
RBC: 2.96 MIL/uL — ABNORMAL LOW (ref 4.22–5.81)
RBC: 3.03 MIL/uL — ABNORMAL LOW (ref 4.22–5.81)
RDW: 16.3 % — ABNORMAL HIGH (ref 11.5–15.5)
WBC: 4.1 10*3/uL (ref 4.0–10.5)
WBC: 4.6 10*3/uL (ref 4.0–10.5)
WBC: 9.1 10*3/uL (ref 4.0–10.5)

## 2010-04-02 LAB — PROTIME-INR: Prothrombin Time: 13.3 seconds (ref 11.6–15.2)

## 2010-04-02 LAB — COMPREHENSIVE METABOLIC PANEL
ALT: 8 U/L (ref 0–53)
ALT: <8 U/L (ref 0–53)
AST: 13 U/L (ref 0–37)
AST: 13 U/L (ref 0–37)
Albumin: 2.8 g/dL — ABNORMAL LOW (ref 3.5–5.2)
CO2: 29 mEq/L (ref 19–32)
CO2: 33 mEq/L — ABNORMAL HIGH (ref 19–32)
Calcium: 8.3 mg/dL — ABNORMAL LOW (ref 8.4–10.5)
Calcium: 8.6 mg/dL (ref 8.4–10.5)
Chloride: 93 mEq/L — ABNORMAL LOW (ref 96–112)
Creatinine, Ser: 6.47 mg/dL — ABNORMAL HIGH (ref 0.4–1.5)
GFR calc Af Amer: 10 mL/min — ABNORMAL LOW (ref >60)
GFR calc Af Amer: 13 mL/min — ABNORMAL LOW (ref >60)
GFR calc non Af Amer: 11 mL/min — ABNORMAL LOW (ref >60)
GFR calc non Af Amer: 8 mL/min — ABNORMAL LOW (ref >60)
Glucose, Bld: 86 mg/dL (ref 70–99)
Sodium: 138 mEq/L (ref 135–145)
Total Bilirubin: 0.4 mg/dL (ref 0.3–1.2)

## 2010-04-02 LAB — POCT I-STAT, CHEM 8
BUN: 11 mg/dL (ref 6–23)
BUN: 30 mg/dL — ABNORMAL HIGH (ref 6–23)
Calcium, Ion: 0.9 mmol/L — ABNORMAL LOW (ref 1.12–1.32)
Chloride: 99 mEq/L (ref 96–112)
Chloride: 97 mEq/L (ref 96–112)
Creatinine, Ser: 4.2 mg/dL — ABNORMAL HIGH (ref 0.4–1.5)
Creatinine, Ser: 5.8 mg/dL — ABNORMAL HIGH (ref 0.4–1.5)
Glucose, Bld: 100 mg/dL — ABNORMAL HIGH (ref 70–99)
Sodium: 138 mEq/L (ref 135–145)

## 2010-04-02 LAB — RENAL FUNCTION PANEL
Albumin: 2.3 g/dL — ABNORMAL LOW (ref 3.5–5.2)
Albumin: 2.5 g/dL — ABNORMAL LOW (ref 3.5–5.2)
BUN: 30 mg/dL — ABNORMAL HIGH (ref 6–23)
CO2: 31 mEq/L (ref 19–32)
Chloride: 94 mEq/L — ABNORMAL LOW (ref 96–112)
Chloride: 97 mEq/L (ref 96–112)
Creatinine, Ser: 7.26 mg/dL — ABNORMAL HIGH (ref 0.4–1.5)
Glucose, Bld: 108 mg/dL — ABNORMAL HIGH (ref 70–99)
Glucose, Bld: 92 mg/dL (ref 70–99)
Phosphorus: 3.1 mg/dL (ref 2.3–4.6)
Potassium: 4.5 mEq/L (ref 3.5–5.1)
Potassium: 5.3 mEq/L — ABNORMAL HIGH (ref 3.5–5.1)
Sodium: 132 mEq/L — ABNORMAL LOW (ref 135–145)

## 2010-04-02 LAB — POCT CARDIAC MARKERS
CKMB, poc: <1 ng/mL — ABNORMAL LOW (ref 1.0–8.0)
Troponin i, poc: <0.05 ng/mL (ref 0.00–0.09)
Troponin i, poc: <0.05 ng/mL (ref 0.00–0.09)

## 2010-04-02 LAB — CROSSMATCH: Unit division: 0

## 2010-04-02 LAB — LIPID PANEL
Cholesterol: 146 mg/dL (ref 0–200)
HDL: 54 mg/dL (ref >39)
LDL Cholesterol: 77 mg/dL (ref 0–99)
Total CHOL/HDL Ratio: 2.7 RATIO
Triglycerides: 76 mg/dL (ref <150)

## 2010-04-02 LAB — URINE MICROSCOPIC-ADD ON

## 2010-04-02 LAB — URINALYSIS, ROUTINE W REFLEX MICROSCOPIC
Glucose, UA: NEGATIVE mg/dL
Nitrite: NEGATIVE
Specific Gravity, Urine: 1.012 (ref 1.005–1.030)
pH: 8.5 — ABNORMAL HIGH (ref 5.0–8.0)

## 2010-04-02 LAB — BASIC METABOLIC PANEL
BUN: 10 mg/dL (ref 6–23)
Chloride: 97 mEq/L (ref 96–112)
Potassium: 3.4 mEq/L — ABNORMAL LOW (ref 3.5–5.1)

## 2010-04-02 LAB — DIFFERENTIAL
Basophils Relative: 0 % (ref 0–1)
Eosinophils Relative: 1 % (ref 0–5)
Lymphs Abs: 1.2 10*3/uL (ref 0.7–4.0)
Monocytes Relative: 12 % (ref 3–12)
Neutro Abs: 6.7 10*3/uL (ref 1.7–7.7)

## 2010-04-02 LAB — CARDIAC PANEL(CRET KIN+CKTOT+MB+TROPI)
CK, MB: 0.8 ng/mL (ref 0.3–4.0)
Relative Index: INVALID (ref 0.0–2.5)
Relative Index: INVALID (ref 0.0–2.5)
Total CK: 30 U/L (ref 7–232)

## 2010-04-02 LAB — APTT: aPTT: 27 seconds (ref 24–37)

## 2010-04-02 LAB — TSH: TSH: 1.655 u[IU]/mL (ref 0.350–4.500)

## 2010-04-04 ENCOUNTER — Ambulatory Visit (HOSPITAL_COMMUNITY): Payer: Medicare Other

## 2010-04-09 ENCOUNTER — Ambulatory Visit (HOSPITAL_COMMUNITY): Payer: Medicare Other | Attending: Nephrology

## 2010-04-09 ENCOUNTER — Ambulatory Visit (HOSPITAL_COMMUNITY): Payer: Medicare Other

## 2010-04-11 ENCOUNTER — Ambulatory Visit (HOSPITAL_COMMUNITY)
Admission: RE | Admit: 2010-04-11 | Discharge: 2010-04-11 | Disposition: A | Discharge status: Home / Self Care | Payer: Medicare Other | Source: Ambulatory Visit | Attending: Nephrology | Admitting: Nephrology

## 2010-04-11 DIAGNOSIS — J449 Chronic obstructive pulmonary disease, unspecified: Secondary | ICD-10-CM | POA: Insufficient documentation

## 2010-04-11 DIAGNOSIS — J4489 Other specified chronic obstructive pulmonary disease: Secondary | ICD-10-CM | POA: Insufficient documentation

## 2010-04-11 DIAGNOSIS — K219 Gastro-esophageal reflux disease without esophagitis: Secondary | ICD-10-CM | POA: Insufficient documentation

## 2010-04-11 DIAGNOSIS — N186 End stage renal disease: Secondary | ICD-10-CM | POA: Insufficient documentation

## 2010-04-11 DIAGNOSIS — I120 Hypertensive chronic kidney disease with stage 5 chronic kidney disease or end stage renal disease: Secondary | ICD-10-CM | POA: Insufficient documentation

## 2010-04-11 DIAGNOSIS — N2581 Secondary hyperparathyroidism of renal origin: Secondary | ICD-10-CM | POA: Insufficient documentation

## 2010-04-11 DIAGNOSIS — D649 Anemia, unspecified: Secondary | ICD-10-CM | POA: Insufficient documentation

## 2010-04-11 MED ORDER — IOHEXOL 300 MG/ML  SOLN
100.0000 mL | Freq: Once | INTRAMUSCULAR | Status: AC | PRN
  Administered 2010-04-11: 34 mL via INTRAVENOUS

## 2010-04-23 LAB — POCT I-STAT, CHEM 8
BUN: 12 mg/dL (ref 6–23)
Creatinine, Ser: 4.6 mg/dL — ABNORMAL HIGH (ref 0.4–1.5)
Potassium: 4.3 mEq/L (ref 3.5–5.1)
Sodium: 135 mEq/L (ref 135–145)

## 2010-04-25 ENCOUNTER — Other Ambulatory Visit (HOSPITAL_COMMUNITY): Payer: Self-pay | Admitting: Nephrology

## 2010-04-25 DIAGNOSIS — N186 End stage renal disease: Secondary | ICD-10-CM

## 2010-04-25 LAB — COMPREHENSIVE METABOLIC PANEL
ALT: 39 U/L (ref 0–53)
BUN: 57 mg/dL — ABNORMAL HIGH (ref 6–23)
CO2: 28 mEq/L (ref 19–32)
Calcium: 8.8 mg/dL (ref 8.4–10.5)
Creatinine, Ser: 10.63 mg/dL — ABNORMAL HIGH (ref 0.4–1.5)
GFR calc non Af Amer: 5 mL/min — ABNORMAL LOW (ref >60)
Glucose, Bld: 102 mg/dL — ABNORMAL HIGH (ref 70–99)

## 2010-04-25 LAB — BASIC METABOLIC PANEL
CO2: 27 mEq/L (ref 19–32)
CO2: 28 mEq/L (ref 19–32)
CO2: 29 mEq/L (ref 19–32)
Calcium: 8.2 mg/dL — ABNORMAL LOW (ref 8.4–10.5)
Calcium: 8.4 mg/dL (ref 8.4–10.5)
Chloride: 88 mEq/L — ABNORMAL LOW (ref 96–112)
Chloride: 88 mEq/L — ABNORMAL LOW (ref 96–112)
Creatinine, Ser: 11.02 mg/dL — ABNORMAL HIGH (ref 0.4–1.5)
Creatinine, Ser: 6.12 mg/dL — ABNORMAL HIGH (ref 0.4–1.5)
Creatinine, Ser: 7.72 mg/dL — ABNORMAL HIGH (ref 0.4–1.5)
GFR calc Af Amer: 11 mL/min — ABNORMAL LOW (ref >60)
GFR calc Af Amer: 5 mL/min — ABNORMAL LOW (ref >60)
GFR calc Af Amer: 5 mL/min — ABNORMAL LOW (ref >60)
GFR calc non Af Amer: 9 mL/min — ABNORMAL LOW (ref >60)
Glucose, Bld: 79 mg/dL (ref 70–99)
Glucose, Bld: 99 mg/dL (ref 70–99)
Sodium: 132 mEq/L — ABNORMAL LOW (ref 135–145)
Sodium: 136 mEq/L (ref 135–145)

## 2010-04-25 LAB — CBC
HCT: 40.3 % (ref 39.0–52.0)
Hemoglobin: 10.9 g/dL — ABNORMAL LOW (ref 13.0–17.0)
Hemoglobin: 11.8 g/dL — ABNORMAL LOW (ref 13.0–17.0)
Hemoglobin: 13.3 g/dL (ref 13.0–17.0)
MCHC: 32.7 g/dL (ref 30.0–36.0)
MCHC: 32.9 g/dL (ref 30.0–36.0)
MCHC: 34.6 g/dL (ref 30.0–36.0)
MCV: 91.4 fL (ref 78.0–100.0)
MCV: 91.6 fL (ref 78.0–100.0)
RBC: 4.4 MIL/uL (ref 4.22–5.81)
RDW: 18.7 % — ABNORMAL HIGH (ref 11.5–15.5)
RDW: 19.3 % — ABNORMAL HIGH (ref 11.5–15.5)

## 2010-04-25 LAB — CARDIAC PANEL(CRET KIN+CKTOT+MB+TROPI)
CK, MB: 12.2 ng/mL — ABNORMAL HIGH (ref 0.3–4.0)
CK, MB: 7.5 ng/mL — ABNORMAL HIGH (ref 0.3–4.0)
Relative Index: 0.1 (ref 0.0–2.5)
Total CK: 4056 U/L — ABNORMAL HIGH (ref 7–232)
Total CK: 5467 U/L — ABNORMAL HIGH (ref 7–232)

## 2010-04-25 LAB — POCT CARDIAC MARKERS

## 2010-04-25 LAB — RENAL FUNCTION PANEL
Albumin: 2.1 g/dL — ABNORMAL LOW (ref 3.5–5.2)
BUN: 41 mg/dL — ABNORMAL HIGH (ref 6–23)
Chloride: 89 mEq/L — ABNORMAL LOW (ref 96–112)
Potassium: 3.7 mEq/L (ref 3.5–5.1)
Sodium: 127 mEq/L — ABNORMAL LOW (ref 135–145)

## 2010-04-25 LAB — CULTURE, BLOOD (ROUTINE X 2): Culture: NO GROWTH

## 2010-04-25 LAB — DIFFERENTIAL
Basophils Relative: 0 % (ref 0–1)
Eosinophils Absolute: 0 10*3/uL (ref 0.0–0.7)
Eosinophils Absolute: 0.1 10*3/uL (ref 0.0–0.7)
Lymphocytes Relative: 15 % (ref 12–46)
Lymphs Abs: 1 10*3/uL (ref 0.7–4.0)
Monocytes Absolute: 0.5 10*3/uL (ref 0.1–1.0)
Neutro Abs: 6.4 10*3/uL (ref 1.7–7.7)
Neutrophils Relative %: 75 % (ref 43–77)

## 2010-04-25 LAB — CK TOTAL AND CKMB (NOT AT ARMC)
CK, MB: 44.2 ng/mL — ABNORMAL HIGH (ref 0.3–4.0)
Total CK: 9207 U/L — ABNORMAL HIGH (ref 7–232)

## 2010-04-25 LAB — PHOSPHORUS: Phosphorus: 3.7 mg/dL (ref 2.3–4.6)

## 2010-04-25 LAB — BLOOD GAS, ARTERIAL
Acid-Base Excess: 2.2 mmol/L — ABNORMAL HIGH (ref 0.0–2.0)
Bicarbonate: 25.6 mEq/L — ABNORMAL HIGH (ref 20.0–24.0)
TCO2: 26.7 mmol/L (ref 0–100)
pCO2 arterial: 35.8 mmHg (ref 35.0–45.0)
pO2, Arterial: 69 mmHg — ABNORMAL LOW (ref 80.0–100.0)

## 2010-04-25 LAB — LACTIC ACID, PLASMA: Lactic Acid, Venous: 2.6 mmol/L — ABNORMAL HIGH (ref 0.5–2.2)

## 2010-04-25 LAB — MAGNESIUM: Magnesium: 2.1 mg/dL (ref 1.5–2.5)

## 2010-04-30 ENCOUNTER — Ambulatory Visit (HOSPITAL_COMMUNITY)
Admission: RE | Admit: 2010-04-30 | Discharge: 2010-04-30 | Disposition: A | Discharge status: Home / Self Care | Payer: Medicare Other | Source: Ambulatory Visit | Attending: Nephrology | Admitting: Nephrology

## 2010-04-30 DIAGNOSIS — Z452 Encounter for adjustment and management of vascular access device: Secondary | ICD-10-CM | POA: Insufficient documentation

## 2010-04-30 DIAGNOSIS — N186 End stage renal disease: Secondary | ICD-10-CM

## 2010-04-30 DIAGNOSIS — N19 Unspecified kidney failure: Secondary | ICD-10-CM | POA: Insufficient documentation

## 2010-05-01 ENCOUNTER — Other Ambulatory Visit: Payer: Self-pay | Admitting: Vascular Surgery

## 2010-05-01 DIAGNOSIS — I723 Aneurysm of iliac artery: Secondary | ICD-10-CM

## 2010-05-05 LAB — URINALYSIS, ROUTINE W REFLEX MICROSCOPIC
Nitrite: NEGATIVE
Specific Gravity, Urine: 1.013 (ref 1.005–1.030)
Urobilinogen, UA: 0.2 mg/dL (ref 0.0–1.0)
pH: 7.5 (ref 5.0–8.0)

## 2010-05-05 LAB — GLUCOSE, CAPILLARY: Glucose-Capillary: 69 mg/dL — ABNORMAL LOW (ref 70–99)

## 2010-05-05 LAB — POCT I-STAT, CHEM 8
Chloride: 94 mEq/L — ABNORMAL LOW (ref 96–112)
Creatinine, Ser: 5.3 mg/dL — ABNORMAL HIGH (ref 0.4–1.5)
Glucose, Bld: 118 mg/dL — ABNORMAL HIGH (ref 70–99)
HCT: 45 % (ref 39.0–52.0)
Potassium: 4.4 mEq/L (ref 3.5–5.1)
Sodium: 139 mEq/L (ref 135–145)

## 2010-05-05 LAB — DIFFERENTIAL
Basophils Absolute: 0 10*3/uL (ref 0.0–0.1)
Lymphocytes Relative: 22 % (ref 12–46)
Monocytes Absolute: 0.8 10*3/uL (ref 0.1–1.0)
Neutro Abs: 4 10*3/uL (ref 1.7–7.7)
Neutrophils Relative %: 62 % (ref 43–77)

## 2010-05-05 LAB — CBC
Hemoglobin: 13.4 g/dL (ref 13.0–17.0)
Platelets: 140 10*3/uL — ABNORMAL LOW (ref 150–400)
RDW: 19.8 % — ABNORMAL HIGH (ref 11.5–15.5)

## 2010-05-05 LAB — URINE MICROSCOPIC-ADD ON

## 2010-06-03 NOTE — H&P (Signed)
NAME:  DANNIEL, TONES NO.:  [id number]   MEDICAL RECORD NO.:  [id number]         PATIENT TYPE:  INP   LOCATION:  6703                         FACILITY:  [facility]   PHYSICIAN:  Hind I Elsaid, MD      DATE OF BIRTH:  03/12/1928   DATE OF ADMISSION:  05/05/2007  DATE OF DISCHARGE:                              HISTORY & PHYSICAL   PRIMARY CARE PHYSICIAN:  Ralene Ok, MD   CHIEF COMPLAINT:  Syncope.   HISTORY OF PRESENT ILLNESS:  This is a 77 year old male, a poor  historian, African American male with multiple medical problems  including end-stage renal disease, on hemodialysis and history of  dilated cardiomyopathy with ejection fraction of 25% and history of  hypertension.  Apparently, the patient was in a good state of health.  He had dialysis yesterday.  The patient drove self to clean clothes to  the laundry, he returned home.  According to him, he felt at home, he  cannot really tell if he had lost conscious or not, but according to  him, he found himself on the floor.  The patient is unsure what exactly  happened.  He felt better after that and then the patient drove back to  get his clothes.  He was sitting on the chair in the laundry and had a  syncopal episode witnessed by the workers at [employer].  The patient  denies any seizure activity.  He complained of mild chest tightness, but  denies any shortness of breath.  Also, the patient complained of left  shoulder pain, as he cannot lift his left shoulder above 45 degrees.  The patient admitted he had this left shoulder pain after the dialysis.  The patient denies any nausea or vomiting.  Denies any sweating.  Denies  any shortness of breath.  Denies any headache.  Denies any blurring of  vision.  In the emergency room, the patient was alert and oriented, and  I could not get any further history from him.  In any circumstances, the  patient found to have a systolic blood pressure around 86/70 sitting,  coming to 93/65, and finally improved to 101/66.   PAST MEDICAL HISTORY:  1. End-stage renal disease on hemodialysis mainly Wednesday, Monday,      and Friday.  The patient has an AV fistula in his left arm.  2. Hypertension.  3. COPD.  4. Secondary hyperparathyroidism.  5. History of congestive heart failure with ejection fraction      approximately around 25% on last echo done on April 9.  6. History of hypercholesterolemia.  7. Status post right hand repair surgery in March 2004.  8. History of peptic stricture.  9. History of gastroesophageal reflux disease.  10.History of anemia of chronic disease.  11.History of left apical thrombus.   MEDICATIONS:  The patient cannot remember his medications at this time.   ALLERGIES:  No known drug allergies.   FAMILY HISTORY:  Noncontributory.   SOCIAL HISTORY:  The patient denies smoking, denies alcohol abuse, and  denies any IV drug abuse.  Lives at home by himself,  has 5 children,  lives near him.   REVIEW OF SYSTEMS:  The patient denies fatigue.  Denies fever, sweating,  or chills.  Eyes, denies any blurring of vision.  Denies sore throat.  Denies cough.  Denies epistaxis.  Denies abdominal pain.  Denies any  nausea or vomiting.  During conversation, complained of some chest  tightness.   PHYSICAL EXAMINATION:  GENERAL:  Alert and pleasant gentleman, not in  obvious respiratory distress.  VITAL SIGNS:  Blood pressure first time was 86/70, pulse rate 59,  respiratory rate 18, and saturation 92%.  HEENT:  Normocephalic and atraumatic.  Pupils are senile miosis.  Ears,  nose, and throat normal.  Mucosa is clear.  Some missing teeth.  NECK:  No lymphadenopathy.  HEART:  S1 and S2 with no added murmur or gallop.  LUNGS:  Normal vesicular breathing with equal air entry.  ABDOMEN:  Soft and nontender.  Bowel sounds positive.  EXTREMITIES:  No lower edema.  Peripheral pulses intact.  CNS:  The patient is alert and oriented x3 with no  focal neurological  finding.  Joints have some limitation of the left shoulder joint, has a  left AV graft on his left arm, which seems working.   BLOOD WORKUP:  He had white blood cells of 6.6, hemoglobin 15.3,  hematocrit 44.9, and platelet count 154.  Potassium 4.9.   Chest x-ray increased opacity in the right lower lung, this likely  represents chronic atelectasis; however, cannot exclude severe embolus  and infiltrate.   CT of the head without contrast, atrophy, microvascular white matter  disease and old infarct.  No definitive  acute or focal abnormality.   ASSESSMENT AND PLAN:  1. A 77 year old [place] American male admitted with 2 episodes of      syncope, found to have systolic blood pressure around 86.  Syncope      possibility secondary to hypotension, cannot exclude any arrhythmia      on aberration to his history of ejection fraction of 25%.  The      patient will be admitted to telemetry.  Recycle cardiac enzymes and      repeat 2D echo.  We will get orthostatic blood pressure, carotid      duplex, and MRI of the brain will be ordered.  We will hold any      blood pressure medication at this time.  During conversation, the      patient complained of some chest tightness.  We will cycle cardiac      enzyme.  We will consider consulting cardiology.  2. End-stage renal disease, to resume hemodialysis.  3. Hypertension.  We will hold all his blood pressure medications      today.  4. History of congestive heart failure.  Same compensated at this      time.  5. Deep vein thrombosis and gastrointestinal prophylaxis.  Medication      to be reconciled.      Hind Bosie Helper, MD  Electronically Signed    HIE/MEDQ  D:  05/05/2007  T:  05/06/2007  Job:  [id number]

## 2010-06-03 NOTE — Discharge Summary (Signed)
NAME:  Jackson [name], Jackson [name] NO.:  [id number]   MEDICAL RECORD NO.:  [id number]          PATIENT TYPE:  INP   LOCATION:  6713                         FACILITY:  [facility]   PHYSICIAN:  Maree Krabbe, M.D.DATE OF BIRTH:  February 21, 1928   DATE OF ADMISSION:  12/09/2007  DATE OF DISCHARGE:  12/18/2007                               DISCHARGE SUMMARY   ADMITTING DIAGNOSES:  1. Severe hyperkalemia greater than 7.5.  2. Status post cardiac arrest secondary to severe hyperkalemia.  3. Ventilator-dependent respiratory failure secondary to status post      cardiac arrest.  4. End-stage renal disease with missed dialysis day prior to      admission.  5. Hypertension.  6. Chronic obstructive pulmonary disease.  7. Ischemic cardiomyopathy with an ejection fraction estimated at 25-      30%.  8. Anemia of chronic disease.  9. Secondary hyperparathyroidism.   DISCHARGE DIAGNOSES:  1. Severe hyperkalemia, corrected with dialysis.  2. Status post cardiac arrest secondary to severe hyperkalemia,      recovered.  3. Ventilator-dependent respiratory failure secondary to status post      cardiac arrest, resolved.  4. End-stage renal disease, on chronic hemodialysis.  5. Hypertension.  6. Chronic obstructive pulmonary disease and chronic bronchitis.  7. Ischemic cardiomyopathy with an ejection fraction of 25-35%.  8. Status post percutaneous angioplasty left arm arteriovenous fistula      and left subclavian vein on December 13, 2007, Dr. Durene Cal.  9. Possible aspiration pneumonia with bronchospasms improved with      steroid, bronchodilators, and antibiotics.  10.Left kidney mass, for followup MRI in 6 months.  11.Elevated prostate surface antigen with followup with Neurology as      outpatient.  12.Anemia of chronic disease.  13.Secondary hyperparathyroidism.   BRIEF HISTORY:  A 77 year old black male with end-stage renal disease on  chronic hemodialysis, Tuesday, Thursday,  Saturday at [facility], missed dialysis from the day prior to admission and was  dialyzed last 3 days ago.  EMS was called to his house for left-sided  weakness and they initially treated him as Code Stroke.  He presented to  the emergency room and according to the ER staff, the patient was  talking appropriate but had a left-sided weakness.  After the CT of the  head was completed, his QRS complex widened and he suffered an acute  cardiopulmonary arrest.  Initial rhythm PEA with a widened QRS.  The  patient was resuscitated after about 15 minutes of CPR with return of  pulse and blood pressure.  During that time, he was treated with usual  ACLS medications and was also treated empirically for presumed  hyperkalemia including insulin, glucose, calcium, and sodium  bicarbonate.  He had a good response in terms of his telemetry QRS  tracing with narrowing of the QRS complex.  Post arrest, EKG showed  junctional rhythm.  The QRS complex showed an intraventricular  conduction delay which was similar to the previous QRS from 2 months  ago.  The patient was unresponsive and unable to provide history and no  family was present at time of admission.   ADMISSION LABORATORY DATA:  Sodium 130, potassium greater than 7.5  having been drawn approximately 15-20 minutes pre arrest, BUN 85, and  creatinine 9.9.  Hemoglobin 13, white count 5000, and platelets 134,000.  Albumin 3.4.  LFTs within normal limits.  Troponin 0.09.  CK-MB 4.1.  Chest x-ray shows no infiltrate or CHF but marked cardiomegaly.  There  was a questionable nodular opacity in the right upper lung.  EKG post  arrest shows a junctional rhythm and rate of 62.   HOSPITAL COURSE:  1. Cardiac arrest.  The patient underwent emergency dialysis after      being intubated and placed on the ventilator.  His potassium was      brought down to within normal limits with dialysis.  Nasogastric      Coreg and Imdur were used  along with as needed IV labetalol.      Cardiac enzymes were essentially negative.  Cardiology was      consulted due to his severe cardiomyopathy.  [facility] saw      the patient whose recommendations were to keep the patient on beta      blocker due to his risk of ventricular tachycardia.  He was having      nonsustained V-tach at the hospital, longest run being 7 beats.  He      was asymptomatic.  He is well-known to [facility]      who felt that he has had short runs of V-tach in the hospital      before.  Ultimately, it was decided that the patient was not a      candidate for an ICD device.  Coreg was started and titrated up to      12.5 mg b.i.d. at time of discharge which he was tolerating well.      He remained in sinus rhythm with first-degree AV block on the      monitor.  Blood pressures were approximately 120/70 at a weight of      61 kg with heart rates in the 70s and 80s.  Followup with [facility] was recommended at time of discharge in approximately 4-      6 weeks with Dr. Excell Seltzer.  2. End-stage renal disease.  The patient was dialyzed 3 times a week,      as usual Tuesday, Thursday, Saturday schedule after his initial      emergency dialysis.  Aranesp was dosed and maintained a hemoglobin      of 11-12.  Hectorol was also dosed with calcium levels      approximately 8 and phosphorus 3.7.  Fistulogram was done on his      left upper fistula, December 13, 2007 by Dr. Myra Gianotti with      angioplasty of the fistula in the left subclavian vein.  [facility].  Dialysis will be      Tuesday, Thursday, Saturday at the [facility].  3. Left kidney mass and elevated PSA.  Incidental finding was that of      a left kidney upper pole mass measuring 1.3 cm of the exophytic.      It measured 1.3 cm upper pole left kidney.  PSA returned at 59.48.      Urology was consulted.  Dr. Patsi Sears  reviewed all data and felt  that the MRI can be repeated in 6 months and no intervention at      this time.  A prostate biopsy will be arranged as an outpatient      through Dr. Imelda Pillow office.  Nuclear bone scan was also      scheduled for the day after discharge and it will be done on      December 19, 2007 as an outpatient.  4. Aspiration pneumonia with bronchospasm.  Upon admission, the      patient's blood and urine culture were cultured and he was      empirically placed on vancomycin and Zosyn.  He remained afebrile.      His white blood cell count within normal limits and blood cultures      remained all negative.  Vancomycin and Zosyn were stopped after a 4-      day course.  A chest x-ray on admission showed a suspicion from      right lung nodule.  However, CAT scan of the chest showed no      nodules whatsoever.  He did have mild COPD, chronic bronchitis, and      cardiomegaly.  Due to his swallowing study, dysphagia 3 diet was      implemented.  He responded favorably with one dose of Solu-Medrol      and Sterapred back course and Augmentin 875 mg daily for 10 days.      Chest x-ray on discharge showed no infiltrate and no edema.   DISCHARGE MEDICATIONS:  1. Coreg 12.5 mg b.i.d., hold before dialysis.  2. Nephro-Vite vitamin 1 daily.  3. Imdur 30 mg at bedtime.  4. EPO 3000 units IV each dialysis.  5. Hectorol 4.0 mcg IV each dialysis.  New dry weight of 61 kg.  6. Augmentin 875 mg 1 every evening for 7 more days.  7. Prednisone Dosepak.  8. Sterapred 10 mg pills, take as directed over the course with 6-8      taper.      Zenovia Jordan, P.A.      Maree Krabbe, M.D.  Electronically Signed    RRK/MEDQ  D:  03/04/2008  T:  03/05/2008  Job:  [id number]

## 2010-06-03 NOTE — Assessment & Plan Note (Signed)
[facility]                            CARDIOLOGY OFFICE NOTE   NAME:Dauphinais, JAFET WISSING                     MRN:          [id number]  DATE:05/25/2007                            DOB:          [birth year]    Mr. Granzow is a 77 year old African American male patient who Dr. Excell Seltzer  was seeing for preop clearance for dental surgery and he had undergone 2-  D echo and stress Cardiolite.  2-D echo on April 28, 2007, showed  ejection fraction to be 25% with diffuse left ventricular hypokinesis  and regional wall motion abnormalities.  He had trivial AI, mild MR,  moderate tricuspid valvular regurgitation and his right atrium was  mildly dilated.  The Cardiolite scan showed moderate inferior wall  infarction from apex to base with no ischemia, ejection fraction 30%.  In the interim the patient ended up having two episodes of syncope and  was admitted to the hospital.  It was felt probably due to hypotension  as he stood up after falling asleep and passed out.  He is a very vague  historian so it is difficult to get a full history.  While on telemetry  he did have seven beats of nonsustained V-tach but no fatal arrhythmias.  His blood pressure medicines were started at low dose and he was  discharged home.  Since the patient has been home he has had no  recurrent syncope, dizziness, shortness of breath.  He is confused about  his medications and I do not believe he is taking them properly.  I  spoke with Dr. Excell Seltzer today, who felt that the patient could have his  teeth pulled but he is at high risk for a general anesthesia so he could  have this done as long this is not performed under general anesthesia.   CURRENT MEDICATIONS:  The patient does not have his Imdur.  He thinks he  is taking carvedilol 25 mg at bedtime, chlorpromazine 25 mg t.i.d. for  hiccups and Renavite daily.   PHYSICAL EXAM:  This is an elderly 78 year old African American male in  no acute  distress.  Blood pressure 100/60, pulse 72, weight 142.  NECK:  Without JVD, HJR, bruit or thyroid enlargement.  LUNGS:  Decreased breath sounds but clear anterior, posterior and  lateral.  HEART:  Regular rate and rhythm at 60 beats per minute, normal S1 and  S2.  A 1/6 systolic ejection murmur at the left sternal border.  ABDOMEN:  Soft without organomegaly, masses, lesions or abnormal  tenderness.  EXTREMITIES:  Without cyanosis, clubbing or edema.  He has good distal  pulses.   IMPRESSION:  1. Ischemic cardiomyopathy, ejection fraction 25-30%.  2. Dobutamine Cardiolite on April 28, 2007, showed moderate inferior      wall infarct, no ischemia, EF 30%, EF 25% on echo.  3. End-stage renal disease on hemodialysis.  4. Recent syncope felt probably related to orthostatic hypotension but      cannot rule out arrhythmia.  5. Hypertension.  6. Hyperparathyroidism secondary to end-stage renal disease.  7. Chronic obstructive pulmonary disease.  8. Anemia.  9. Hypercholesterolemia.  10.Status post right hernia repair.  11.Gastroesophageal reflux disease.   PLAN AT THIS TIME:  I have given the patient a prescription for Imdur 30  mg daily.  He is also given a prescription for Coreg 6.25 mg t.i.d.  except on dialysis days he should not take this and wrote this out for  him.  He can go ahead and have his teeth pulled as long as he does not  undergo general anesthesia.  He can see Dr. Excell Seltzer back in one month.      Jacolyn Reedy, PA-C  Electronically Signed      Veverly Fells. Excell Seltzer, MD  Electronically Signed   ML/MedQ  DD: 05/25/2007  DT: 05/25/2007  Job #: [id number]   cc:   Charlynne Pander, D.D.S.  Ralene Ok, M.D.  Veverly Fells. Excell Seltzer, MD

## 2010-06-03 NOTE — Procedures (Signed)
CEPHALIC VEIN MAPPING   INDICATION:  AV fistula placement.   HISTORY:  End-stage renal disease.   EXAM:  The right cephalic vein is not compressible.   Diameter measurements range from 0.15 to 0.08 cm.   The right basilic vein is compressible.   Diameter measurements range from 0.39 to 0.27 cm.   See attached worksheet for all measurements.   IMPRESSION:  1. The right cephalic vein is noncompressible on the upper arm and has      a thick lumen in the lower arm.  2. Patent right basilic vein with diameter measurements as described      above.   ___________________________________________  Di Kindle. Edilia Bo, M.D.   NT/MEDQ  D:  12/18/2009  T:  12/18/2009  Job:  [id number]

## 2010-06-03 NOTE — Procedures (Signed)
REFERRING PHYSICIAN:  Isidor Holts, MD   CLINICAL HISTORY:  A 77 year old patient being evaluated for syncope.   MEDICATIONS:  Aspirin, heparin, Protonix, calcium, Nephro-Vite,  Hectorol, Zofran, Ventolin, Atrovent, Tylenol, oxycodone, and Dilaudid.   This is a 17-channel routine EEG recorded with the patient awake and  asleep using standard 10/20 electrode placement.   No clear background awake rhythm was noted during this tracing.  The  record begins with the patient being drowsy and the patient asleep  during most of the tracing in stage III and IV sleep.  No paroxysmal  epileptiform activity, spikes or sharp waves are noted.  Length of the  recording is 21.1 minutes.  Technical component is average.  EKG tracing  reveals regular sinus rhythm.  Hyperventilation is not performed.  Photic stimulation is unremarkable.   IMPRESSION:  This EEG performed during mostly sleep state is within  normal limits.  No definite epileptiform features were noted.           ______________________________  Sunny Schlein. Pearlean Brownie, MD     EAV:WUJW  D:  01/10/2008 18:34:50  T:  01/11/2008 03:44:31  Job #:  [id number]   cc:   Isidor Holts, M.D.

## 2010-06-03 NOTE — Assessment & Plan Note (Signed)
OFFICE VISIT   ZYGMUNT, MCGLINN  DOB:  May 23, 1928                                       12/18/2009  ZOXWR#:[id number]   I saw Jackson [name] in the office today to evaluate him for further access.  He was referred by Dr. Arrie Aran.  He has had a left forearm AV fistula  since August 2005.  In November 2009, he had a central venous stenosis  successfully dilated by Dr. Myra Gianotti.  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  Given the progressive aneurysmal disease in his left forearm AV fistula,  we were asked to evaluate him for a right upper extremity graft so that  the left forearm graft could be ligated once his new access was  functional.  Of note, the patient dialyzes on Tuesdays, Thursdays and  Saturdays.  He has had no recent uremic symptoms.  Specifically he  denies any nausea, vomiting, generalized fatigue, palpitations or  significant shortness of breath.   REVIEW OF SYSTEMS:  He has had no chest pain, chest pressure,  palpitations or arrhythmias.   PHYSICAL EXAMINATION:  This is a pleasant 77 year old gentleman who  appears his stated age.  His blood pressure is 130/68, heart rate is 77, saturation 93%.  LUNGS:  Clear bilaterally to auscultation.  He has an excellent thrill in his fistula on the left forearm but he has  2 large aneurysms and the more proximal aneurysm has a small scab.  There is no drainage or evidence of infection or cellulitis.  His upper  arm cephalic vein has actually started to mature and appears to be a  good quality vein.  He has a palpable right brachial and radial pulse.  The cephalic vein in the right arm does not appear adequate.   I have independently interpreted his vein mapping today on the right  side which shows that the forearm and upper arm cephalic vein on the  right are not compressible and very small and therefore not usable for  fistula.  The  basilic vein on the right is fairly small with diameters  ranging from 0.2-0.28 cm.  Thus the basilic vein on the right is of  marginal quality.   I think the best option would be to convert the left forearm fistula to  an upper arm fistula.  This may be ready to cannulate fairly soon given  that it has already started to mature since he had the forearm fistula  for many years.  I think there is only a small chance that he is a  candidate for basilic transposition on the right.  I have recommended  that we place a catheter briefly until the upper arm fistula can be  cannulated.  He is agreeable with this plan and we will have to schedule  this on a non dialysis day.  It has therefore been scheduled for  December 16.     Di Kindle. Edilia Bo, M.D.  Electronically Signed   CSD/MEDQ  D:  12/18/2009  T:  12/19/2009  Job:  [id number]

## 2010-06-03 NOTE — Assessment & Plan Note (Signed)
OFFICE VISIT   Jackson [name], Jackson [name]  DOB:  1928/05/20                                       02/22/2009  ZOXWR#:[id number]   This is a patient with end-stage renal disease who originally had a left  Cimino shunt in 2005 by Dr. Hart Rochester.  Most recently, he underwent  dilation of a central venous stenosis by Dr. Myra Gianotti IV in 2009 and  again in 2010 on October 29 by Dr. Miles Costain.  He comes in today with the  following referral from Dr. 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  Of note, Dr. Arbie Cookey is in the operating room.  Dr.  Arbie Cookey was contacted and requested that I seek guidance from Dr. Myra Gianotti  IV.   HISTORY OF PRESENT ILLNESS:  The patient is a very pleasant 77 year old  gentleman with end-stage renal disease who in 2005 underwent a left  Cimino shunt by Dr. Hart Rochester.  Hemodialysis was initiated in 2007.  In  2009, November 24, Dr. Myra Gianotti IV studied him and dilated central venous  stenosis.  This was again performed by Dr. Miles Costain in 2010.  The fistula  continues to work well.  However, he has developed two aneurysmal areas  in his left forearm where his cannula are placed for hemodialysis.  These aneurysms are beginning to thin out and have scant areas.  He was  referred from Dr. Lowell Guitar for evaluation of his left forearm AV fistula.   PAST MEDICAL HISTORY:  1. Significant for an ischemic cardiomyopathy with an ejection      fraction of 25%-30%.  He has end-stage renal disease and dialyzes      Tuesdays, Thursdays and Saturdays.  2. Hypertension.  3. Dyslipidemia.  4. Secondary hyperparathyroidism.  5. Anemia of chronic disease.  6. Gout.   PAST SURGICAL HISTORY:  Consists of a left AV fistula with a right  inguinal hernia repair.   ALLERGIES:  He has no current allergies.   CURRENT MEDICATIONS:  1. Consist of omeprazole 40 mg p.o. q.h.s.  2. Coreg 12.5 mg p.o. b.i.d.  3. Proscar 25 mg p.o. b.i.d.  4. Thorazine 25 mg p.r.n.  hiccups.  5. Imdur 30 mg p.o. daily.  6. Rena-Vite one p.o. daily.  7. Aspirin 81 mg p.o. daily.   FAMILY HISTORY:  Noncontributory.  However, he denies having family  members with vascular disease or cardiac disease less than 31 years old.   SOCIAL HISTORY:  He is widowed.  He has seven children.  He is retired  from the city.  He has a remote history of tobacco use, quitting 35-40  years ago.  He does not partake of alcohol.   REVIEW OF SYSTEMS:  Was significant for end-stage renal disease.  All  other interrogatories were answered negatively.   PHYSICAL FINDINGS:  Revealed a very well-nourished gentleman in no  apparent distress.  Heart rate was 82, blood pressure 139/91, O2  saturations were 100%.  HEENT:  NCAT, PERRLA, EOMI, normal conjunctivae.  Mucous membranes were pink and moist.  He had poor dentition.  Neck:  Revealed trachea to be midline.  Auscultation of the lungs demonstrated  some initial wheezing which cleared with coughing.  Cardiac exam  revealed regular rate and rhythm.  Peripheral pulses were present in all  extremities.  The abdomen was soft, nontender, nondistended.  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  Neurological exam was nonfocal.  No rashes or ulcers were appreciated on the skin.  Attention is now  turned to the left Cimino shunt.  There is a palpable thrill in the  shunt, it is functioning.  There is, however two aneurysmal areas.  On  each aneurysmal area there is some heavy scabbing.  The lower aneurysm  greater than the upper aneurysm.  At this time, I elected to obtain  venous mapping of his upper extremities.  His cephalic vein on the left  side measured 0.73-0.64 cm from the antecubital fossa to the axillary  vein.  The basilic vein measured 0.27 cm.  The right cephalic vein was  not visualized.  The right basilic vein measured 0.2 cm to 0.4 cm.  At  this time, I sought  guidance from Dr. Myra Gianotti IV upon request of Dr.  Arbie Cookey.  This was performed over the telephone.  Dr. Myra Gianotti IV consulted  his operative report performed on December 13, 2007 and made the  following recommendations:  1. Fistulogram to evaluate central stenosis which has been treated on      two separate occasions by Dr. Myra Gianotti IV and again by Dr. Miles Costain.      According to Dr. Chester Holstein note, a 7-mm fistula angioplasty was      performed and the venogram confirmed no significant residual      stenosis and patency of the fistula at that time.  This was 44      October 2010.  Depending upon the degree of central stenosis plans      will be made to proceed with a left upper arm fistula.   Wilmon Arms, PA   Larina Earthly, M.D.  Electronically Signed   KEL/MEDQ  D:  02/22/2009  T:  02/23/2009  Job:  [id number]

## 2010-06-03 NOTE — Discharge Summary (Signed)
NAME:  Jackson [name], Jackson [name] NO.:  [id number]   MEDICAL RECORD NO.:  [id number]          PATIENT TYPE:  INP   LOCATION:  5504                         FACILITY:  [facility]   PHYSICIAN:  Ladell Pier, M.D.   DATE OF BIRTH:  Jun 27, 1928   DATE OF ADMISSION:  01/07/2008  DATE OF DISCHARGE:  01/11/2008                               DISCHARGE SUMMARY   DISCHARGE DIAGNOSES:  1. Syncopal episode thought to be related to blood pressure issues.  2. End-stage renal disease, on hemodialysis Tuesday, Thursday, and      Saturday at [facility].  3. Secondary hyperparathyroidism.  4. Hypertension.  5. Cardiomyopathy of uncertain etiology, ejection fraction of 25% on      echo November 2009.  6. Moderate tricuspid regurgitation.  7. Dyslipidemia.  8. Gastroesophageal reflux disease.  9. Anemia of chronic disease.  10.History of apical thrombus.  11.History of elevated PSA.  The patient to follow up for further      outpatient workup.  12.Gout.  13.History of rectal bleeding, October 2007.  14.Chronic obstructive pulmonary disease.  15.Cardiac arrest with pulseless electrical activity secondary to      severe hyperkalemia on December 09, 2007, responded to the emergent      hemodialysis and resuscitation.  16.History of syncope, April 2009.  17.Status post right inguinal hernia repair.  18.Left arm arteriovenous fistula.  19.Left exophytic renal mass on CT scan December 12, 2007.  20.Mild thrombocytopenia.   DISCHARGE MEDICATIONS:  1. Aspirin 81 mg daily.  2. Calcium acetate 667 mg daily.  3. Isosorbide mononitrate 30 mg daily.  4. Metoprolol 12.5 mg twice daily.  5. Rena-Vite once daily.  6. Hydrocodone as needed.   FOLLOWUP APPOINTMENT:  The patient to follow up with kidney center and  with PCP, Dr. Ludwig Clarks.   PROCEDURES:  Hemodialysis on Tuesday.   CONSULTANTS:  Nephrology.   HISTORY OF PRESENT ILLNESS:  The patient is a 77 year old male with past  medical history  significant for CHF.  According to the patient, he had a  history of end-stage renal disease and is status post hemodialysis on  December 28, 2007.  He felt well all day, then he was sitting in the  chair at home, developed some mild headache, asked his son some Tylenol.  His son went to get the Tylenol, came back, and found him passed out,  eyes rolled back, and his head unresponsive.  EMS was called.  He had no  chest pain or shortness of breath.   PAST MEDICAL HISTORY/FAMILY HISTORY/SOCIAL  HISTORY/MEDICATIONS/ALLERGIES/REVIEW OF SYSTEMS:  Per admission H&P.   PHYSICAL EXAMINATION:  VITAL SIGNS:  Temperature 98.3, pulse of 75,  respirations 20, blood pressure 130/74, and pulse ox 99% on room air.  GENERAL:  The patient lying in bed, does not seem to be in any acute  distress.  HEENT:  Normocephalic and atraumatic.  Pupils are reactive to light, but  without erythema.  CARDIOVASCULAR:  Regular rate and rhythm.  LUNGS:  Clear bilaterally.  ABDOMEN:  Positive bowel sounds.  EXTREMITIES:  Without edema.   HOSPITAL COURSE:  1. Syncopal  episode, etiology is unclear.  The patient did have an EEG      done to rule out seizure disorder, but the results are still      pending.  His head CT was normal.  He had a 2-D echo back in      November.  The patient's blood pressure dropped probably secondary      to taking his medications.  Norvasc was discontinued.  He will be      continued on all his other medications.  The patient to follow up      with the Dialysis Center.  The patient will remain in hospital      today to get hemodialysis and can be discharged in the morning  2. End-stage renal disease.  As mentioned above, Nephrology was      consulted.  The patient received hemodialysis while inpatient.  3. History of cardiomyopathy of uncertain etiology.  The patient was      not clinically in CHF during this admission.  4. Chronic obstructive pulmonary disease that was also stable.  5.  Anemia.  Hemoglobin is stable.  6. Gastroesophageal reflux disease.  The patient was placed on a PPI.   DISCHARGE LABORATORIES:  Sodium 133, potassium 4.1, chloride 90, CO2 of  27, glucose 99, BUN 36, and creatinine 7.72.  WBC 4.5, hemoglobin 11.8,  and platelet 100.  Cardiac markers are negative.  We will monitor  patient's platelet as an outpatient.  CT of the head, no acute  intracranial findings.  Chest x-ray, stable cardiomegaly.      Ladell Pier, M.D.  Electronically Signed     NJ/MEDQ  D:  01/10/2008  T:  01/11/2008  Job:  [id number]

## 2010-06-03 NOTE — Assessment & Plan Note (Signed)
[facility]                            CARDIOLOGY OFFICE NOTE   NAME:[name], Jackson ALSIP                     MRN:          [id number]  DATE:03/21/2008                            DOB:          1928-03-22    REASON FOR VISIT:  Discussion of driving issues.   HISTORY OF PRESENT ILLNESS:  [name] is a 77 year old gentleman who  was just seen here in January of this year.  He has severe  cardiomyopathy with an LVEF less than 30% as well as multiple other  medical comorbidities.  He had a cardiac arrest in November in the  setting of severe hyperkalemia.  The patient has an end-stage renal  disease.   He presents today with his daughter for the sole reason of discussing  driving issues.  At the time of his last visit, he came in with his son  and we had a discussion about whether he should be allowed to drive.  My  recommendation was that he should not drive.  I have reviewed my note,  then at that time thought it was unsafe for him to drive because of his  cardiac comorbidities and his risk for sudden cardiac events in the  setting of his end-stage renal disease and severe underlying  cardiomyopathy.  There is a great deal of family tension related to his  driving.  He and his daughter argued throughout their visit today about  issues related to the patient's car and transportation.  I reviewed in  detail with both the patient and his daughter, my recommendation that he  not drive based on his underlying cardiac problems and risk of injuring  himself and others.  The patient is actually okay with this.  Then, the  main issue between him and his family is that he has recently asked his  neighbor to drive him around down using his own car.  The patient's  daughter is very upset at this.  She wants to manage his transportation  along with her 4 other siblings.  She became upset and left the room  during the office visit.  She actually left altogether and  the patient  took the bus home.  He understood my recommendation and had no other  further questions.  He will continue his regular followup as scheduled.  The patient was not charge for this office visit as I did not address  any true medical issues today.     Veverly Fells. Excell Seltzer, MD     MDC/MedQ  DD: 03/21/2008  DT: 03/22/2008  Job #: [id number]

## 2010-06-03 NOTE — Consult Note (Signed)
NAME:  TREYVEON, MOCHIZUKI NO.:  [id number]   MEDICAL RECORD NO.:  [id number]          PATIENT TYPE:  INP   LOCATION:  6703                         FACILITY:  [facility]   PHYSICIAN:  Wilber Bihari. Caryn Section, M.D.   DATE OF BIRTH:  [birth year]   DATE OF CONSULTATION:  05/06/2007  DATE OF DISCHARGE:                                 CONSULTATION   RENAL EVALUATION AND MANAGEMENT NOTE   SUBJECTIVE:  This is a 77 year old black man well known to our service  with end-stage renal disease secondary to hypertension on chronic  hemodialysis Monday, Wednesday, and Friday at [facility]  who also has severe dilated cardiomyopathy with an ejection fraction as  low as 10% in 2006 and most recently increased to 25% on April 28, 2007.  Also with gastroesophageal reflux disease, COPD, GI bleed, and iron  deficiency anemia secondary hyperparathyroidism, history of old lacunar  infarcts on CT scans.  He was admitted for yesterday for syncopal  episode while at [facility], while sitting in chair according to the  patient.  He denies never hitting the floor.  He said that he did feel  little dizzy prior to passing out, but then awoke to ambulance workers  there.  The EMS records show systolic blood pressures in the 80s upon  transport to [facility].  The patient denies any pain, shortness of  breath, nausea, vomiting, or diaphoresis prior to loss of consciousness.  He denies any illness earlier in the week.  His last dialysis was on  May 04, 2007.  The patient is a poor historian.   OBJECTIVE:  VITAL SIGNS:  Temperature 97.4, blood pressure 132/84  supine, pulse is 80, respirations are 20, and O2 saturations 98% on 2 L.  GENERAL:  Well-developed, well-nourished black male in no acute  distress.  He is awake, alert, appropriate, and oriented x2.  HEENT:  Normocephalic and atraumatic.  Pupils are equal, round, and  reactive to light.  Sclerae anicteric.  There is no facial edema or  droop.  LUNGS:  Clear with decreased breath sounds at the bases, right greater  than left.  HEART:  Regular rate and rhythm.  No rub or murmur audible.  ABDOMEN:  Positive bowel sounds.  Soft, nontender, and nondistended.  No  hepatosplenomegaly.  EXTREMITIES:  Left AV fistula with bruit.  There is no lower extremity  edema.   LABORATORY DATA:  Sodium 139, potassium 4.8, chloride 93, CO2 of 34, BUN  43, creatinine 8.63, glucose 89, calcium is 9.3, and phosphorus 6.1.  White count 5400, hemoglobin 13.8, and platelets 144,000.  CK-MB is  negative x2.  Chest x-ray, right base atelectasis.  EKG shows sinus  rhythm with first degree A-V block with no acute event.  MRI shows no  acute intracranial abnormalities, age-advanced atrophy, multiple  punctate foci, remote hemorrhage bilaterally, multiple bilateral remote  lacunar infarcts.   ASSESSMENT:  1. End-stage renal disease.  We will continue his dialysis every      Monday, Wednesday, and Friday.  He runs for 4 hours.  He is using  standard heparin on a 2 potassium bath at the [facility].  2. Iron deficiency anemia.  EPO dose at 4400 units with INFeD at 50 mg      intravenous weekly.  We will continue as inpatient.  3. Secondary hyperparathyroidism.  Hectorol is given at 3 mcg      intravenous at each dialysis.  We will continue this as inpatient.  4. Hypertension.  Severe dilated cardiomyopathy on Coreg with improved      ejection fraction from 2006-2009 to 25%.  Cardiology has been      consulted.  5. Syncope.  Suspect hypotension that may have been a cardiac event.      Workup is in progress.      Zenovia Jordan, P.A.      Richard F. Caryn Section, M.D.  Electronically Signed    RRK/MEDQ  D:  05/06/2007  T:  05/06/2007  Job:  [id number]

## 2010-06-03 NOTE — Letter (Signed)
March 18, 2007    Charlynne Pander, D.D.S.  [facility]  [address]  [place], [place] [address]   RE:  CASSEY, HURRELL  MRN:  [id number]  /  DOB:  02-06-1928   Dear Ron:   It was my pleasure to see Naseem Varden for preoperative cardiac  evaluation on March 17, 2007.   As you know, Mr. Cavins is a 77 year old gentleman with end-stage renal  disease and chronic congestive heart failure.  It appears you evaluated  him earlier this month and he has multiple oral and dental problems  including chronic periodontitis, multiple missing teeth and bone loss.  He is tentatively planned for extraction of all of his remaining teeth  under general anesthesia in the operating room.  We were asked to see  him for preoperative evaluation in the setting of a history of  cardiomyopathy.   Mr. Waggle is not able to provide much history today.  In reviewing  records he has a history of dilated cardiomyopathy with an ejection  fraction of 10% by various consult notes.  I have reviewed the [facility] records and there is no echocardiogram report that I can find.  The  notes describe cardiac issues including moderate mitral regurgitation,  mild aortic insufficiency and severe tricuspid regurgitation in addition  to his cardiomyopathy.  He thinks he may have had a heart  catheterization many years ago but he does not recall the details.   From a symptomatic standpoint Mr. Mcfann really has minimal complaints.  He has mild exertional dyspnea that is stable over many years.  He has  no chest pain, orthopnea, PND or edema.  He really states that he feels  well overall.  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  He denies symptoms at that level of activity.  He  continues to drive and drives himself to dialysis on Mondays, Wednesdays  and Fridays.  He denies lightheadedness,  syncope, orthopnea, PND or  other cardiac complaints.   PAST MEDICAL HISTORY:  1. End-stage renal disease.  Dialysis access is AV fistula in left      arm.  He reports hemodialysis for 3-4 years.  2. Dilated cardiomyopathy with severely depressed ejection fraction by      report.  3. Hypertension.  4. Hyperparathyroidism secondary to end-stage renal disease.  5. COPD.  6. Anemia.  7. Hypercholesterolemia.  8. Status post right hernia repair.  9. Gastroesophageal reflux disease.   MEDICATIONS:  1. The patient did not bring his medicines today.  As listed in the      paperwork from the [facility] he is on Levitra as      needed.  2. Coreg 12.5 mg twice daily except on mornings of dialysis.  3. Viagra 50 mg at bedtime.  4. Chlorpromazine 10 mg every 8 hours as needed for hiccoughs.  5. Rena-Vite daily.  6. Isosorbide 30 mg daily.   ALLERGIES:  NKDA.   SOCIAL HISTORY:  The patient is a widower.  He has 7 children.  He is a  former smoker but quit many years ago.  He does not drink alcohol.   FAMILY HISTORY:  Mother died at age 11 from old age.  Father died at 77  from old age.  He had 1 brother who died at 73 of complications of  high  blood pressure.   REVIEW OF SYSTEMS:  A 12 point review of systems was performed and  pertinent positives included only gastroesophageal reflux disease and  generalized fatigue.  Other systems were reviewed and were negative  except as detailed above.   PHYSICAL EXAMINATION:  GENERAL:  The patient is an elderly-appearing  male in no acute distress.  VITAL SIGNS:  Weight is not recorded.  Blood pressure is 132/80, heart  rate is 90, respiratory rate is 16.  HEENT:  Normal with the exception of poor dentition.  NECK:  Normal carotid upstrokes without bruits.  Jugular venous pressure  is normal.  No thyromegaly or thyroid nodules.  LUNGS:  Clear bilaterally.  CARDIOVASCULAR:  The apex is diffuse and laterally displaced.  The  heart  is regular rate and rhythm.  I do not appreciate any murmurs or gallops.  There is no right ventricular heave or lift.  ABDOMEN:  Abdomen is soft, nontender.  No organomegaly.  No abdominal  bruits.  EXTREMITIES:  There is no clubbing, cyanosis or edema.  There is a  functioning AV fistula in the left forearm.  Peripheral pulses are 2+  and equal.  BACK:  There is no CVA tenderness.  SKIN:  Skin is warm and dry without rash.  LYMPHATICS:  No adenopathy.  NEUROLOGIC:  Cranial nerves II-XII are intact.  Strength is intact and  equal bilaterally.   EKG shows sinus rhythm with ST and T-wave abnormality suggestive of  lateral ischemia.  There is marked first degree AV block and left atrial  enlargement.   ASSESSMENT:  Mr. Seith is a 77 year old gentleman with a history of  severe cardiomyopathy and end-stage renal disease.  By clinical history  he appears very well compensated with almost complete lack of symptoms  even with moderate level activity.  In spite of that I still believe Mr.  Schoneman has significant risk for general anesthesia.  I am unable to find  any recent cardiac studies and the patient does not recall having these  done or anything checked in several years since he has really been  stable from a cardiac standpoint.  I think prior to undergoing general  anesthesia he needs a 2-D echocardiogram to evaluate LV function as well  as a Myoview scan to rule out a large area of ischemia.  Pending the  results of these findings we will make final recommendation regarding  his operative risk.  If he is not ischemic then he would be a reasonable  candidate for general anesthesia even in the setting of a low ejection  fraction since he is clinically is well compensated.   Ron, I will be in touch as soon as the results of his nuclear study and  echocardiogram are complete so that you can move forward with planning  his dental extraction.  Thanks again for  asking me to see  Mr. Bartoszek and please feel free to call at any time  with questions regarding his care.    Sincerely,      Veverly Fells. Excell Seltzer, MD  Electronically Signed    MDC/MedQ  DD: 03/18/2007  DT: 03/18/2007  Job #: [id number]   CC:   Fayrene Fearing L. Deterding, M.D.  [facility]. Eliott Nine, M.D.

## 2010-06-03 NOTE — Procedures (Signed)
CEPHALIC VEIN MAPPING   INDICATION:  Evaluate for AV fistula placement.   HISTORY:  End-stage renal disease, left arm AV fistula.   EXAM:  The right cephalic vein is not visualized.  The right basilic vein is  compressible.  Diameter measurements range from 0.20 cm to 0.40 cm.   The left cephalic vein is evaluated proximally to the antecubital fossa  level.  Diameter measurements range from 0.68 cm to 0.73 cm.  The left  basilic vein is compressible.  Diameter measurements range from 0.16 cm  to 0.27 cm.   See attached worksheet for all measurements.   IMPRESSION:  1. Patient's left cephalic vein is of acceptable diameter for use as a      dialysis access.  2. The patient has a left arm arteriovenous fistula placed in the      lower forearm.  3. Patient's bilateral basilic veins are of marginal acceptable      diameter for use as a dialysis access.     ___________________________________________  Larina Earthly, M.D.   CB/MEDQ  D:  02/22/2009  T:  02/23/2009  Job:  [id number]

## 2010-06-03 NOTE — Discharge Summary (Signed)
NAME:  Jackson [name], Jackson [name] NO.:  [id number]   MEDICAL RECORD NO.:  [id number]          PATIENT TYPE:  INP   LOCATION:  6703                         FACILITY:  [facility]   PHYSICIAN:  Hind I Elsaid, MD      DATE OF BIRTH:  [birth year]   DATE OF ADMISSION:  05/05/2007  DATE OF DISCHARGE:  05/08/2007                               DISCHARGE SUMMARY   DISCHARGE DIAGNOSES:  1. Syncope felt to be secondary to hypOtension.  2. End-stage renal disease, on hemodialysis.  3. History of hypertension.  4. History of dilated cardiomyopathy with ejection fraction of 25%.  5. History of chronic obstructive pulmonary disease, not on home      oxygen.  6. History of hypercholesterolemia.  7. History of gastroesophageal reflux disease.  8. History of anemia of chronic disease.  9. History of moderate tricuspid regurgitation.   MEDICATIONS:  1. Coreg 6.25 mg p.o. THREE daily.  2. Aspirin 325 mg p.o. daily.  3. Protonix 40 mg p.o. daily.  4. Rena-Vite 1 tab p.o. daily.  5. Senokot 2 tab p.o. nightly p.r.n.  6. Imdur 30 mg daily.  7. PhosLo 667 one tab p.o. t.i.d.   MEDICATIONS STOPPED:  The patient was advised not to take Viagra, and  Coreg decreased the dose from 12.25 to 6.25 mg twice daily.   PROCEDURES:  1. Chest x-ray.  Chronic atelectasis on chest x-ray.  2. CT head.  Atrophy and microvascular white matter disease and old      infarct.  3. X-ray of the left shoulder.  No evidence of acute bony trauma to      the left shoulder, osteoarthritis of acromioclavicular joint.  4. MRI.  No acute intracranial abnormality.  Chronic microvascular      ischemia, multiple punctate foci, remote hemorrhage bilaterally can      be seen in the setting of a myeloid angiopathy, multiple bilateral      remote lacunar infarcts.   HOSPITAL COURSE:  Please review the history done on May 05, 2007.  The  patient admitted is a 77 year old African American male with multiple  medical problems,  dilated cardiomyopathy, ejection fraction of 25%, and  history of hypertension.  History was very limited from the patient.  According to him, he fell twice when at home; the second has a history  of syncope while he was at the laundry cleaning his clothes.  The EMS  and ER noted to have blood pressure of systolic around 80.  The patient  admitted for evaluation of the syncope.  The patient admitted to  telemetry with cycling cardiac enzymes which were negative.  Mainly  cardiology consulted for the possibility of arrhythmia as the cause of  the patient's symptoms.  During monitor, the patient did not experience  any fatal arrhythmia, had only 7 beats of nonsustained V-tach.  On the  day of admission, we have to hold all his blood pressure medications.  As the blood pressure started to increase, the patient was started on  Coreg 6.25 mg p.o. 3 times a day.  The patient used to take  Viagra.  The  patient was advised to stop taking any further Viagra.  The patient also  was advised not to take Coreg in the morning of the dialysis.  At this  time, cardiology did evaluate the patient and they think the syncope is  most probably secondary to the hypotension episode.  Further workup  regarding the dilated cardiomyopathy and arrhythmia to be done as an  outpatient.  They will arrange outpatient monitor.  During  hospitalization, blood pressure significantly improved.  The patient  resumed his Coreg.  We decreased the dose to 6.25 mg p.o. 3 times a day  and the patient to resume his Imdur 30 mg p.o. daily.  Also during  hospitalization, the patient received hemodialysis as scheduled in  addition to Aranesp during dialysis.  We felt that the patient seems  medically stable to be discharged home.  Follow with [facility]  for outpatient monitor and possibly a BSS studies.      Hind Bosie Helper, MD  Electronically Signed     HIE/MEDQ  D:  05/08/2007  T:  05/09/2007  Job:  [id number]   cc:    Ralene Ok, M.D.  Jonelle Sidle, MD  Veverly Fells. Excell Seltzer, MD

## 2010-06-03 NOTE — H&P (Signed)
NAME:  Jackson [name], Jackson [name] NO.:  [id number]   MEDICAL RECORD NO.:  [id number]          PATIENT TYPE:  INP   LOCATION:  5504                         FACILITY:  [facility]   PHYSICIAN:  Isidor Holts, M.D.  DATE OF BIRTH:  1928/06/06   DATE OF ADMISSION:  01/07/2008  DATE OF DISCHARGE:                              HISTORY & PHYSICAL   PMD:  Dr. Ralene Ok.  Primary nephrologist:  Dr. Caryn Section and Catalina Pizza.   CHIEF COMPLAINT:  Syncope.   HISTORY OF PRESENT ILLNESS:  This is a 77 year old male. For past  medical history, see below.  According to the patient, he has a history  of end-stage renal disease on hemodialysis and is status post dialysis  on the a.m. of 01/07/2008.  He felt well all day, although he had not  eaten, and at about 7:00 p.m. was sitting in a chair watching TV, when  he developed a mild headache and asked his son to get him some Tylenol.  Son proceded to do so, and the patient became hot, sweaty, and very  dizzy.  According to the son, when he got back, he found that the  patient had passed out, eyes rolled back in his head, momentarily  unresponsive and then confused.  EMS was called.  The patient denies  associated chest pain, palpitations or shortness of breath.   PAST MEDICAL HISTORY:  1. End-stage renal disease, on hemodialysis Tuesdays, Thursdays, and      Saturdays at [facility].  2. Secondary hyperparathyroidism.  3. Hypertension.  4. Cardiomyopathy of uncertain etiology, with systolic and diastolic      dysfunction/ejection fraction 25% on 2-D echocardiogram      12/11/2007.  5. Moderate tricuspid regurgitation.  6. Dyslipidemia.  7. GERD.  8. Anemia of chronic disease.  9. History of apical thrombus.  10.History of elevated PSA.  11.Gout.  12.History of rectal bleed 10/20/2005.  13.COPD.  14.Cardiac arrest with pulseless electrical activity secondary to      severe hyperkalemia on 12/09/2007, responded to cardiopulmonary  resuscitation and emergent hemodialysis.  15.History of syncope 04/21/2007.  16.Status post right inguinal hernia repair.  17.Left arm AV fistula.  18.Left exophytic renal mass on CT scan of 12/12/2007 (status post      workup by Jetta Lout, NP).   MEDICATIONS:  1. Amlodipine (dosage unknown) one p.o. daily.  2. Aspirin 81 mg p.o. daily.  3. Calcium acetate (Phos-Lo) 667 mg p.o. daily.  4. Isosorbide mononitrate 30 mg p.o. daily.  5. Metoprolol tartrate dosage unknown one p.o. daily.  6. Renovite one p.o. daily.  7. Hydrocodone/acetaminophen (5/500) pills one p.o. p.r.n.   ALLERGIES:  No known drug allergies.   REVIEW OF SYSTEMS:  As mentioned in the HPI, otherwise negative.  The  patient denies fever, chills, coughing, abdominal pain, vomiting or  diarrhea.   SOCIAL HISTORY:  The patient is widowed.  He has five offspring.  He  currently resides with his son.  Has a remote history of tobacco use,  quit approximately 40 years ago and denies alcohol use or drug abuse.  FAMILY HISTORY:  Noncontributory.   PHYSICAL EXAMINATION:  VITALS:  Temperature 97.4, pulse 72 per minute  and regular, respiratory rate 18, BP initially 98/50 mmHg, rechecked  101/71 mmHg, pulse oximeter 97% on room air.  The patient did not appear to be in obvious acute distress at the time  of this evaluation, alert, communicative, and not short of breath at  rest, denied chest pain.  HEENT:  No clinical pallor. No jaundice.  No conjunctival injection.  Throat is clear.  NECK:  Supple.  JVD not seen.  No palpable lymphadenopathy.  No palpable  goiter.  CHEST:  Clear to auscultation.  No wheezes or crackles.  CARDIOVASCULAR:  Heart sounds 1 and 2 heard.  Regular rate and rhythm.  No murmurs.  ABDOMEN:  Full, soft, and nontender.  No palpable organomegaly.  No  palpable masses.  Normal bowel sounds.  LOWER EXTREMITIES:  No pitting edema.  Palpable peripheral pulses.  The  patient has a functioning AV  fistula in the left arm.  MUSCULOSKELETAL:  Osteoarthritic changes are noted.  CENTRAL NERVOUS SYSTEM:  No focal neurologic deficit on gross  examination.   INVESTIGATIONS:  CBC:  WBC 5.6, hemoglobin 12.6, hematocrit 39.6,  platelets 85.  Electrolytes:  Sodium 138, potassium 4.2, chloride 98,  CO2 31, BUN 13, creatinine 4.40, glucose 96.  AST 18, ALT 8, alkaline  phosphatase 136.  Chest x-ray dated 01/07/2008 shows stable  cardiomegaly, no acute lung disease.  Head CT scan dated 01/07/2008  shows no acute intracranial abnormality.  There was stable atrophy and  chronic microvascular white matter disease, old lacunar infarcts.   ASSESSMENT/PLAN:  1. Syncopal episode.  Etiology is unclear at present.  The patient      certainly has no focal neurologic deficit on physical examination.      Blood pressure was borderline at the time of presentation, but is      now normal.  We shall admit for telemetric monitoring, cycle      cardiac enzymes, do 12 lead EKG, and for completeness do EEG to      rule out seizure.   1. End-stage renal disease.  The patient is on dialysis Tuesdays,      Thursday, and Saturdays and as a matter of fact had hemodialysis in      the a.m. of 01/07/2008.  Volume depletion may have been      contributory to # 1 above.  We will notify renal team of admission,      in order to reinstate his dialysis schedule, should he be      hospitalized for long enough.   1. History of cardiomyopathy.  Etiology is uncertain, however, the      patient's 2-D echocardiogram of 11/2007 showed combined systolic      and diastolic dysfunction.  Clinically he does not appear to be in      congestive heart failure at present.   1. Chronic obstructive pulmonary disease (COPD).  This is stable.  We      shall observe, and utilize p.r.n. bronchodilators and nebulizers if      indicated.   1. History of anemia.  Hemoglobin is essentially normal.   1. Gastroesophageal reflux disease  (GERD).  We shall place the patient      on proton pump inhibitor treatment.   Further management will depend on clinical course.      Isidor Holts, M.D.  Electronically Signed     CO/MEDQ  D:  01/07/2008  T:  01/08/2008  Job:  [id number]   cc:   Ralene Ok, M.D.  Richard F. Caryn Section, M.D.  [facility] Eliott Nine, M.D.

## 2010-06-03 NOTE — Consult Note (Signed)
NAME:  MIZRAIM, Jackson [name] NO.:  [id number]   MEDICAL RECORD NO.:  [id number]          PATIENT TYPE:  INP   LOCATION:  6713                         FACILITY:  [facility]   PHYSICIAN:  Luis Abed, MD, FACCDATE OF BIRTH:  24-Oct-1928   DATE OF CONSULTATION:  12/13/2007  DATE OF DISCHARGE:                                 CONSULTATION   Mr. Excell Seltzer has a known cardiomyopathy.  He also has end-stage renal  disease.  He has historically had problems with hyperkalemia.  Once  again he was hyperkalemic and came into the hospital on December 10, 2007 with a cardiac arrest.  He was resuscitated, his potassium was  treated and he is stabilized.  He did have to miss dialysis on Thursday.   Since being in the hospital he is improving.  On his monitor it was  noted that he had seven beats of wide complex tachycardia.  The rates  ranged within those seven beats from 100-150 beats per minute.  He did  not have any significant symptoms during this.  His potassium is in the  normal range.  His magnesium is low.  In this renal patient I am not  completely sure of the significance of his magnesium in this setting and  I will have to review this issue.  I am hesitant to give a renal failure  patient magnesium.   PAST MEDICAL HISTORY:   ALLERGIES:  No known drug allergies.   MEDICATIONS:  1. Coreg 6.25 b.i.d.  2. Imdur 30.  3. Chlorpromazine for hiccups.  4. Flexeril 5 mg.  5. Vicodin for pain.  6. Albuterol inhaler p.r.n.  7. Atrovent inhaler p.r.n.  8. Protonix daily.  9. In the hospital he is on Zosyn.   OTHER MEDICAL PROBLEMS:  See the complete list below.   SOCIAL HISTORY:  The patient lives in [place].  He is a retired  Insurance claims handler.  He does live alone and until recently has been  active.  He was active until the day he was admitted.  I am not sure if  he is having any confusion at home or not.   FAMILY HISTORY:  The family history is noncontributory at this  point.   REVIEW OF SYSTEMS:  As of today he is not having any fevers, chills or  sweats.  He is not having any headaches.  He does have some pain and  swelling in his right arm.  He has chronic shortness of breath.  He has  a chronic productive cough.  He is not having any GU symptoms.  Other  than the pain and swelling in his right arm, he is not having any other  muscular aches and pains.  Otherwise his review of systems is negative.   PHYSICAL EXAM:  The patient is oriented.  He is oriented x2.  His affect  is normal when I asked him how he was feeling.  HEENT:  Reveals no xanthelasma.  He has normal extraocular motion.  There are no carotid bruits.  There is no jugular venous distention.  He  has poor dentition.  LUNG EXAM:  Reveals diffuse rhonchi.  CARDIAC:  Exam reveals an S1with an S2.  There is a 2/6 systolic murmur.  ABDOMEN:  Is soft.  There are no masses or bruits.  He has no  significant peripheral edema at this time.   Monitor strip does show seven beats of wide complex tachycardia as  noted.  EKG reveals diffuse T-wave changes.  The rhythm is difficult to  assess on the 12-lead but on his monitor he is in sinus rhythm with a  prolonged PR interval that is old.  Today hemoglobin is 11.1.  Platelet  count is 87,000.  BUN is 23 and creatinine 6.17, potassium is 4.1.  Magnesium is 1.5.  Calcium is 8.0.  Troponin-I is 0.2.  There is only  one available and others are being checked.  Certainly he could have  troponin elevation from his cardiac arrest.   Problems include:  #1.  Recurrent hyperkalemia leading to cardiac arrest.  He has been  resuscitated and he is stable now and doing better.  #2.  End-stage renal disease.  He is on dialysis.  Unfortunately he  missed a dialysis.  #3.  Hypertension, treated.  #4.  COPD, treated.  #5.  Hyperparathyroidism, treated.  #6.  History of ejection fraction of 25-30%.  We really do not know the  basis of his cardiomyopathy.  He may  have been catheterized in the  remote past but we do not have that data.  He has not been catheterized  during the time that we have known him.  #7.  Hyperlipidemia.  #8.  Elevated PR interval.  #9.  Gout.  #10.  History of a rectal bleed in 2007.  #11.  History of syncope in April 2009.  We are not sure if this was a  true syncopal episode.  At that time there was no evidence that it was  related to  ventricular tachycardia, but this always has to be kept in  mind.  #12.  Seven beats of ventricular tachycardia.  For today his potassium  needs to be treated as it is.  We will have to ask the renal team if it  is appropriate in his setting to treat his magnesium into a more  therapeutic range.   The bigger questions relate to how to approach his arrhythmia over time.  Question number one is whether or not he is a candidate for an ICD.  We  will have to get information from Dr. Excell Seltzer who knows the patient  better.  It is possible that he has not been felt to be a good candidate  for an ICD considering all of his comorbidities.  Along with this issue  will be the question of whether or not we need to know more about his  coronary status.  We know that he had a Cardiolite scan that revealed no  definite ischemia in April 2009.  Consideration would have to be given  to whether or not it would be appropriate to proceed with cardiac  catheterization.   As of today I feel that it is most prudent to monitor him, treat his  potassium, consider any change to his magnesium and follow him.  We will  get further input from Dr. Excell Seltzer as to whether or not it would be  appropriate to consider any of the other more aggressive measures that I  have mentioned above.      Luis Abed, MD, [facility]  Electronically Signed  JDK/MEDQ  D:  12/13/2007  T:  12/13/2007  Job:  [id number]   cc:   Veverly Fells. Excell Seltzer, MD

## 2010-06-03 NOTE — Assessment & Plan Note (Signed)
OFFICE VISIT   RAZI, HICKLE  DOB:  [birth year]                                       03/21/2009  JXBJY#:[id number]   The patient comes in for a followup visit.  He was seen by Iva Boop on  02/22/2009.  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  There was some concern that these aneurysms were  thinning out and there may be some risk of bleeding associated with  these aneurysms.  In addition, he had been having some problems with  bleeding after dialysis and the fistula was somewhat pulsatile.  For  this reason a fistulogram was recommended and this was performed on  02/27/2009.  I have reviewed this fistulogram and it knows two  significant aneurysms and diffuse irregularity of his left forearm AV  fistula.  He had a stenosis of the cephalic vein just before it entered  the subclavian vein on the left and this was successfully ballooned with  a 7 mm balloon.  He is now sent back for me to evaluate.  He states that  he is no longer having bleeding problems associated with his fistula and  has had no pain associated with the fistula.   REVIEW OF SYSTEMS:  He has had no fever or chills.   PHYSICAL EXAMINATION:  This is a pleasant 77 year old gentleman who  appears his stated age.  His blood pressure is 115/72, heart rate is  120, temperature 97.4.  His fistula has a good thrill.  There is diffuse  irregularity of the forearm component of his fistula which has two large  aneurysms.  One aneurysm has some thinning out of the skin with a scab  over one area.  The upper arm cephalic vein is dilating up and has a  reasonable thrill.   I again reviewed his fistulogram that showed successful PTA of the  cephalic vein stenosis just distal to the subclavian vein origin.   Given the aneurysms of his left forearm AV fistula with thinning of the  skin and a scab I have recommended that we ligate the forearm fistula  and place a  new upper arm fistula on the left.  I have explained that I  think there is significant risk of bleeding associated with these  aneurysms that he currently has in the forearm fistula and I do not  think this will get better with time and will more likely become a  bigger problem.  I have explained that they would not be able to use the  upper arm fistula right away and he would have to have a temporary  catheter at least for 3 or 4 weeks until the upper arm fistula could be  used.  I think it is already partially matured.  I think this would be a  reasonable approach given that the PTA of the cephalic vein stenosis was  successful.  The patient currently is not agreeable to this and feels  strongly that the fistula is working well and he is not having any  bleeding problems.  I reinforced multiple times the risk of bleeding  from the thinned out area over the aneurysm.  However, he is still not  agreeable to proceed with placement of a new upper arm brachiocephalic  fistula and a temporary catheter with ligation of  his forearm fistula.  If he changes his mind I certainly would be happy to schedule this on a  nondialysis day.     Di Kindle. Edilia Bo, M.D.  Electronically Signed   CSD/MEDQ  D:  03/21/2009  T:  03/22/2009  Job:  [id number]

## 2010-06-03 NOTE — Consult Note (Signed)
NAME:  Jackson [name], Jackson [name] NO.:  [id number]   MEDICAL RECORD NO.:  [id number]          PATIENT TYPE:  INP   LOCATION:  6703                         FACILITY:  [facility]   PHYSICIAN:  Jonelle Sidle, MD DATE OF BIRTH:  1928/02/24   DATE OF CONSULTATION:  05/06/2007  DATE OF DISCHARGE:                                 CONSULTATION   PRIMARY CARDIOLOGIST:  Veverly Fells. Excell Seltzer, MD.   REASON FOR CONSULTATION:  Syncope and possible chest pain.   HISTORY OF PRESENT ILLNESS:  Jackson [name] is a 77 year old gentleman with  a history of severe cardiomyopathy with recent left ventricular ejection  fraction of approximately 25% associated with mild mitral regurgitation  and moderate tricuspid regurgitation with mildly increased pulmonary  artery systolic pressure.  Additional comorbid illness include end-stage  renal disease requiring hemodialysis, hypertension, chronic obstructive  pulmonary disease, and gastroesophageal reflux disease.  The status of  his coronary arteries is not entirely clear based on available  information.  He may have undergone a cardiac catheterization many years  ago, but I see no record of this.  He was just recently seen in the  office by Dr. Excell Seltzer for a preoperative assessment prior to planned  dental surgery under general anesthesia.  The patient was referred for  an echocardiogram, which is detailed above, and also a Myoview (results  of this study not available to me at this time).  It was felt that he  would be fairly high risk for general anesthesia, simply based on his  present comorbid illness, but further discussion was planned.  He is now  admitted to the hospital following an episode of apparent syncope.  He  is a very difficult historian and most of the history is actually  provided by his daughter present today.  My understanding is that what  happened is that he took some of his laundry to a laundromat and then  went back home and sat down  in a chair.  When he got up from this chair  to go, he apparently fell down on the floor and was on the floor for 10  minutes.  He was then able to get back up and go to the [place]  where he again sat down in a chair and was reportedly observed to fall  out of the chair on to the floor.  He was then taken to the emergency  department.  In reviewing records, I see that he was relatively  hypotensive with systolic blood pressures in the 80s and heart rate in  the 50s, presumably sinus rhythm.  He admits to noncompliance with his  medications stating that he may take this perhaps 3 or 4 days out of a 7-  day period.  There is some question as to whether he had chest pain  yesterday as well, although the patient denies it at present.  His  electrocardiogram shows sinus rhythm with a prolonged PR interval of 274  milliseconds, anteroseptal Q-waves, and nonspecific ST-T wave changes.  He also has a leftward axis but no frank inferior T-waves.  Telemetry  monitoring  so far showed a sinus rhythm with no significant pauses or  ventricular arrhythmias.  He has not yet been placed back on his beta  blocker.   ALLERGIES:  No known drug allergies.   PRESENT MEDICATIONS:  Include:  1. Aspirin 81 mg p.o. daily.  2. Enoxaparin 30 mg subcu nightly.  3. Protonix 40 mg p.o. daily.  4. Rena-Vite daily.  5. Senokot-S daily.   At home, he was also to be taking Coreg 12.5 mg p.o. b.i.d. except on  the mornings of dialysis, Viagra 50 mg in the evening p.r.n., and Imdur  30 mg p.o. daily.   PAST MEDICAL HISTORY:  As outlined above.  The patient has had end-stage  renal disease, requiring hemodialysis for the last 3 or 4 years, and has  an AV fistula in the left arm.  He has secondary hyperparathyroidism,  history of chronic anemia, and is status post right inguinal hernia  repair.   SOCIAL HISTORY:  The patient is a widower.  He has 7 children.  He is a  former smoker but quit many years ago.   Denies any alcohol use.   FAMILY HISTORY:  Family history was reviewed.  The patient's mother died  in her late 32s of old age.  The patient father died in his late 57s of  old age.   REVIEW OF SYSTEMS:  Somewhat difficult to assess in this patient, given  his limited history.  He relates to me no clear history of exertional  chest pain.  He has been more short of breath with activity.  He has not  had any prior episodes of syncope by report.  No obvious bleeding  problems.  No significant lower extremity edema or orthopnea.  All other  systems were negative.   PHYSICAL EXAMINATION:  VITAL SIGNS:  Heart rate has been in the 70s to  80s and sinus rhythm, temperature 97.4 degrees, blood pressure 132/84,  respirations 20, and oxygen saturation 98% on 2 L nasal cannula.  GENERAL:  This is an elderly male, seated in bed in no acute distress.  HEENT:  Conjunctivae are normal.  Oropharynx clear with poor dentition.  NECK:  Supple.  No frankly elevated jugular venous pressure, without  bruits.  No thyromegaly.  LUNGS:  Essentially clear, somewhat diminished at bases.  No active  wheezing.  CARDIAC:  Reveals a regular rate and rhythm.  Distant heart sounds.  No  S3 gallop.  ABDOMEN:  Soft and nontender.  Bowel sounds present.  EXTREMITIES:  Show no pitting edema.  Distal pulses are 1+.  AV fistula  noted in the left arm.  Mild kyphosis noted.  NEUROPSYCHIATRIC:  The patient is alert and oriented x2.   LABORATORY DATA:  WBCs 5.4, hemoglobin 13.8, hematocrit 42.3, platelets  144, INR 1.0.  Sodium 139, potassium 4.8, chloride 93, CO2 34, glucose  89, BUN 43, creatinine 8.6, troponin I levels equivocal of 0.06 with  normal CK-MB levels, BMP level 889, and LDL 90.   IMAGING STUDIES:  Chest x-ray from May 05, 2007, demonstrates opacity  in the right lower lung, possibly atelectatic, although cannot entirely  exclude infiltrate.  Head CT scan was also done demonstrating atrophy  and  microvascular white matter disease as well as evidence of old  infarcts without acute abnormality.  Left shoulder film was done as the  patient apparently fell with one of his episodes of syncope.  There was  no evidence of acute bony trauma to the left  shoulder.  He did have  acromioclavicular joint osteoarthritis.  The brain MRI was also done  demonstrating no acute abnormalities.  He had age-advanced atrophy with  extensive periventricular white matter changes.  Multiple punctate foci  of remote hemorrhage bilaterally.  The possibility of amyloid angiopathy  was mentioned.  Multiple bilateral remote lacunar infarcts are  described.   IMPRESSION:  1. Recent episodes of syncope, etiology not yet clear.  The patient      was relatively hypotensive and bradycardic at presentation.  At      least one of these episodes occurred after standing up from a      chair.  It is possible that he experienced orthostatic dizziness      and syncope, although clearly with his history of cardiomyopathy,      the possibility of a malignant ventricular arrhythmia or perhaps      even conduction system disease with his prolonged PR interval must      be considered.  His cardiac markers at this point are fairly      equivocal and I now have the copy of the report of his Myoview done      on April 28, 2007, which demonstrated a moderate sized inferior wall      scar from apex to base without frank ischemia.  At that time,      ejection fraction was 30%, which is in line with his recent      echocardiogram.  So far his telemetry is unrevealing.  He is      actually off of his beta-blocker altogether at this point, and it      sounds as if he has been noncompliant with it at home.  2. History of previous cerebrovascular disease is confirmed by recent      CT scan and MRI although nothing acute is noted.  3. End-stage renal disease, on hemodialysis.  4. History of hypertension  5. Chronic obstructive pulmonary  disease.   RECOMMENDATIONS:  At the present time, I would continue aspirin and  resume a low dose of Coreg.  We will follow his hemodynamics and  telemetry.  It is not entirely clear to me how aggressive to push his  cardiac evaluation.  Certainly an electrophysiology consultation could  be obtained given his propensity for ventricular arrhythmias and perhaps  even underlying conduction system disease that may have contributed to  his episode of syncope.  Whether he would be a good device candidate or  not is uncertain.  Based on his recent Myoview, he has evidence of  inferior scar without frank ischemia.  If device therapy were  considered, a cardiac catheterization would more than likely need to be  pursued in advance.  Our service will follow with you with further  recommendations.      Jonelle Sidle, MD  Electronically Signed     SGM/MEDQ  D:  05/06/2007  T:  05/07/2007  Job:  [id number]

## 2010-06-03 NOTE — H&P (Signed)
NAME:  Jackson [name], Jackson [name] NO.:  [id number]   MEDICAL RECORD NO.:  [id number]          PATIENT TYPE:  EMS   LOCATION:  MAJO                         FACILITY:  [facility]   PHYSICIAN:  Ladell Pier, M.D.   DATE OF BIRTH:  Mar 01, 1928   DATE OF ADMISSION:  10/04/2007  DATE OF DISCHARGE:                              HISTORY & PHYSICAL   CHIEF COMPLAINT:  Weakness.   HISTORY OF PRESENT ILLNESS:  The patient is a 77 year old African  American male who presented to the hospital secondary to weakness.  He  stated that he went to get his hemodialysis this morning, and when he  got to the hemodialysis center he just felt weak all over.  He stated  that they did not bother to do a hemodialysis.  They called 911 and sent  him to the ER.  He states that when he got to the ER he felt fine.  Blood work in the ER showed that he was hyperkalemic.  He had no chest  pain, no shortness of breath.  He has no complaints at this time.   PAST MEDICAL HISTORY:  1. Significant for end-stage renal disease, hemodialysis Tuesday,      Thursday, Friday at [facility].  2. Hypertension.  3. COPD.  4. Secondary hyperparathyroidism.  5. CHF with EF of 25%,  6. Hypercholesterolemia.  7. GERD.  8. History of anemia of chronic disease.  9. History of apical thrombus.  10.Moderate tricuspid regurg.  11.History of elevated PSA.  12.Gout.  13.History of rectal bleeding October 2007.   FAMILY HISTORY:  Noncontributory.   SOCIAL HISTORY:  The patient's wife is deceased.  He has 5 children.  He  lives alone.  He has a remote tobacco history.  No alcohol use.  No IV  drug use.   MEDICATIONS:  1. Isosorbide mononitrate ER 30 mg at bedtime.  2. Rena-Vite daily.  3. Coreg 6.25 mg twice daily.  4. Protonix 40 mg daily.  5. Senokot S 2 at bedtime as needed.  6. Thorazine 25 mg twice daily as needed.   ALLERGIES:  No known drug allergies.   REVIEW OF SYSTEMS:  Negative, otherwise stated in the  HPI.   PHYSICAL EXAMINATION:  VITAL SIGNS:  Temperature 97.5, blood pressure  134/94, pulse of 79, respirations 18, pulse ox 100% on room air.  HEENT:  Head is normocephalic, atraumatic.  Pupils reactive to light.  Throat without erythema.  CARDIOVASCULAR:  Regular rate and rhythm.  LUNGS:  Clear bilaterally.  ABDOMEN:  Soft, nontender, nondistended.  Positive bowel sounds.  Obese.  EXTREMITIES:  Without edema.   LABORATORIES:  His EKG does show prolonged PR interval.  Sodium 131,  potassium 6.9, chloride 90, CO2 28, glucose 87, BUN 60, creatinine 9.12,  calcium 8.6, magnesium 2.2.  WBC 4.4, hemoglobin 13, MCV 95, platelets  148.  UA negative except for 100 protein, urine WBC 3 to 6, RBC 0 to 2.  Chest x-ray showed cardiomegaly and COPD, trace bilateral pleural  effusion.   ASSESSMENT/PLAN:  1. Weakness.  2. Hyperkalemia.  3. Prolonged PR interval.  4. End-stage renal disease.  5. Hypertension.  6. Chronic obstructive pulmonary disease.  7. Congestive heart failure.  8. Dyslipidemia.  9. Gastroesophageal reflux disease.  10.Anemia of chronic disease.  11.Hyponatremia.  12.Gout.  13.Mild thrombocytopenia.   We will admit the patient to the hospital.  Will get nephrology consult  for hemodialysis.  He was given Kayexalate in the ER. We will recheck  his potassium level.  The patient feels back to baseline so will not do  much further workup.  We will, however, check his TSH and cardiac  enzymes,  a CMP and repeat CBC in the morning.      Ladell Pier, M.D.  Electronically Signed     NJ/MEDQ  D:  10/04/2007  T:  10/04/2007  Job:  [id number]   cc:   Wilber Bihari. Caryn Section, M.D.

## 2010-06-03 NOTE — Discharge Summary (Signed)
NAME:  Jackson [name], Jackson [name] NO.:  [id number]   MEDICAL RECORD NO.:  [id number]          PATIENT TYPE:  OBV   LOCATION:  6523                         FACILITY:  [facility]   PHYSICIAN:  Altha Harm, MDDATE OF BIRTH:  1928/03/25   DATE OF ADMISSION:  10/04/2007  DATE OF DISCHARGE:  10/05/2007                               DISCHARGE SUMMARY   DISCHARGE DISPOSITION:  Home.   FINAL DISCHARGE DIAGNOSES:  1. Hyperkalemia, resolved.  2. End-stage renal disease.  3. Hypertension.  4. History of chronic obstructive pulmonary disease.  5. History of secondary hyperparathyroidism.  6. History of congestive heart failure with an ejection fraction of      25%.  7. Hyperlipidemia.  8. Prolonged PR interval.   DISCHARGE MEDICATIONS:  1. Isosorbide mononitrate 30 mg p.o. q.h.s.  2. RenaVit one tablet p.o. daily.  3. Coreg 6.25 mg p.o. b.i.d.  4. Protonix 40 mg daily.  5. Senokot 1-2 tablets q.h.s. p.r.n.  6. Thorazine 25 mg p.o. b.i.d. p.r.n.   Please note, the patient is not on an ACE inhibitor secondary to renal  disease.   CONSULTANTS:  Wilber Bihari. Caryn Section, M.D., nephrology.   PROCEDURES:  None.   DIAGNOSTIC STUDIES:  Chest x-ray, 2 view, that shows cardiomegaly and  COPD changes.  There is trace bilateral pleural effusion.   CODE STATUS:  Full code.   ALLERGIES:  No known drug allergies.   PRIMARY CARE PHYSICIAN:  Ralene Ok, M.D.   NEPHROLOGIST:  Wilber Bihari. Caryn Section, M.D.   CHIEF COMPLAINT:  Weakness.   HISTORY OF PRESENT ILLNESS:  Please see H&P dictated by Dr.  Olena Leatherwood on  10/04/2007.   HOSPITAL COURSE:  Hyperkalemia.  The patient was found to have  hyperkalemia.  The potassium was 6.9 on admission. The patient received  Kayexalate and then dialysis emergently.  Today, the patient's potassium  is 5.14.  It is felt that the patient had hyperkalemia secondary to a  lapse in dietary restriction.  The patient had nutrition consultation  with instruction in food  that should be avoided secondary to his end-  stage renal disease.  The patient's condition at the time of discharge  is stable.  Vital signs are as follows:  Temperature 98, blood pressure  111/__________, heart rate 82, respiratory rate 18, saturation 97% on  room air.  The patient had first degree AV block on the telemetry  moniter and 12 lead EKG.  He has  normal S1 and S2, no murmurs, rubs or  gallops noted.  Abdomen had normal bowel sounds.  Abdomen was soft,  nontender, nondistended.  No masses or hepatosplenomegaly.  The patient  had no clubbing, cyanosis, or edema of the extremities.   DIETARY RESTRICTIONS:  The patient should be on a renal diet.   PHYSICAL RESTRICTIONS:  None.   FOLLOWUP:  The patient is to follow up with his primary care physician  as needed and dialysis is usually scheduled .      Altha Harm, MD  Electronically Signed     MAM/MEDQ  D:  10/05/2007  T:  10/05/2007  Job:  [id number] 

## 2010-06-03 NOTE — H&P (Signed)
NAME:  Jackson [name], Jackson [name] NO.:  [id number]   MEDICAL RECORD NO.:  [id number]          PATIENT TYPE:  INP   LOCATION:  2908                         FACILITY:  [facility]   PHYSICIAN:  Maree Krabbe, M.D.DATE OF BIRTH:  06-Oct-1928   DATE OF ADMISSION:  12/09/2007  DATE OF DISCHARGE:                              HISTORY & PHYSICAL   REASON FOR ADMISSION:  Hyperkalemia and cardiac arrest.   HISTORY:  This is a 77 year old black male with end-stage renal disease,  on dialysis Tuesday, Thursday, and Saturday.  He missed dialysis  yesterday. He was last dialyzed 3 days ago.  EMS was called to his house  for left-sided weakness, and he was initially treated as a code stroke.  When he presented to the emergency room, according to the ER staff, the  patient was talking and appropriate but had a weak left side.  After a  head CT was completed, the patient's QRS widened and he suffered an  acute cardiopulmonary arrest.  Initial rhythm was PEA with a widened  QRS.  The patient was resuscitated, after about 15 minutes of CPR, with  return of pulse and blood pressure.  During this time, he was treated  with usual ACLS medications and also was treated with empiric therapy  for presumed hyperkalemia with insulin, glucose, calcium, and sodium  bicarbonate.  He had a good response in terms of his telemetry QRS  tracing with narrowing of the QRS complex.  Post-arrest, the EKG shows a  junctional rhythm;  the QRS complex shows an intraventricular conduction  delay which is similar to the previous QRS from 2 months ago.  The  patient is currently unresponsive and unable to provide any history.  There is no family available.   PAST MEDICAL HISTORY:  1. Ischemic cardiomyopathy, EF 25-30% in May of this year with      hypokinetic inferior wall.  2. Hypertension.  3. Secondary hyperparathyroidism.  4. COPD with remote smoker.  5. Anemia.  6. Hyperlipidemia.  7. GERD.  8. Gout.  9.  History of elevated PSA.  10.History of apical thrombus in the past.  11.History of syncope in April 2009.  12.Possible ventricular arrhythmia in the past.   PAST SURGICAL HISTORY:  1. Right hernia repair.  2. Left arm AV fistula.   SOCIAL HISTORY:  Widower and lives alone, has 5 children.  Former  smoker.  No alcohol or drug use.   MEDICATIONS:  1. Coreg 6.25 b.i.d.  2. Imdur 30 mg daily p.r.n.  3. Chlorpromazine 25 b.i.d. for hiccups.  4. Flexeril 5 mg t.i.d. p.r.n. as a muscle relaxant.  5. Vicodin p.r.n.   ALLERGIES:  Negative.   REVIEW OF SYSTEMS:  Unavailable.   PHYSICAL EXAMINATION:  VITAL SIGNS:  Blood pressure 202/110 after arrest  and resuscitation, heart rate 84, respirations 16, temp 97.6.  SKIN:  Warm and dry.  GENERAL:  This is a thin elderly black male, completely unresponsive  with no purposeful movement.  HEENT:  Pupils are 3 mm and symmetrically nonreactive.  NECK:  Supple.  No JVD.  CHEST:  Some  coarse rhonchi bilaterally.  No rales or significant  wheezing.  Good air movement.  CARDIAC:  Regular rate and rhythm.  Soft systolic ejection murmur.  Quiet precordium.  Slightly displaced PMI laterally.  No S3 or S4.  ABDOMEN:  Soft, mildly distended.  Active bowel sounds.  No ascites.  EXTREMITIES:  No edema.  Good peripheral pulses in the feet and hands  and a strong bruit and thrill in the left arm AV fistula.  NEUROLOGIC:  He is not moving at all spontaneously.  Post-arrest, pupils  are fixed as above.   LABORATORY DATA:  Sodium 130, potassium greater than 7.5, this was drawn  pre-arrest about 15-20 minutes prior to the arrest.  BUN 85, creatinine  9.9, hemoglobin 13, white blood count 5000, platelets 134, albumin 3.4.  LFTs normal.  Alk phos 141, CK-MB 4.1, troponin 0.09.  Portable chest x-  ray, endotracheal tube in good position, cardiomegaly.  No infiltrate or  CHF.  There is a new nodular opacity in the right upper lung.  Recommend  chest CT after  stabilization.  EKG post-arrest junctional rhythm, rate  62.  The tracing on October 05, 2007, was a sinus rhythm with similar  QRS pattern.   IMPRESSION:  1. Severe hyperkalemia.  2. Cardiac arrest due to severe hyperkalemia.  3. Ventilator-dependent respiratory failure.  4. Altered mental status post-arrest.  Pre-arrest, the patient was      awake with a weak left side; this may have been due to the      hyperkalemia or possibly some unrecognized ischemic cerebrovascular      event.  Head CT results are pending.  5. End-stage renal disease with missed dialysis yesterday. Usual      schedule is Tuesday, Thursday, and Saturday.  6. Hypertension.  7. New lung nodule, right upper lung on chest x-ray.  8. History of chronic obstructive pulmonary disease, former smoker,      not on home oxygen.  9. History of ischemic cardiomyopathy, ejection fraction 25-30%      earlier this year.   PLAN:  Admit.  Acute urgent dialysis for severe hyperkalemia with  followup potassium levels, PPI, and DVT prophylaxis.  CCM consult for  management of ventilator and critical care issues.      Maree Krabbe, M.D.  Electronically Signed    RDS/MEDQ  D:  12/10/2007  T:  12/10/2007  Job:  [id number]

## 2010-06-03 NOTE — Assessment & Plan Note (Signed)
OFFICE VISIT   [name], [name]  DOB:  March 30, 1928                                       12/10/2009  ZOXWR#:[id number]   Patient returns today 4 weeks post endovascular repair of a right common  iliac artery aneurysm using Excluder Gore graft.  He also had  embolization of his right internal iliac artery preoperatively by Dr.  Myra Gianotti.  The procedure was done percutaneously using a Pro-Glide  closure device.  The patient has done well since discharge from the  hospital with no discomfort in the insertion site.  No abdominal pain.   On exam today, blood pressure 131/86, heart rate 80, respirations 26.  Abdomen:  Soft, nontender with no pulsatile mass.  Right inguinal area  is well healed with an excellent femoral, popliteal, and posterior  tibial pulse palpable.   Today I have ordered a CT angiogram of the abdomen and pelvis which I  reviewed by computer.  The stent graft is in excellent position with no  evidence of endoleak or migration and no flow in the internal iliac  artery where the embolization was performed.   I think he is doing well.  He will return in 6 months with a similar CT  angiogram to be performed on the day of his return.     [name], M.D.  Electronically Signed   JDL/MEDQ  D:  12/10/2009  T:  12/11/2009  Job:  [id number]

## 2010-06-03 NOTE — Op Note (Signed)
NAME:  [name], LARCH NO.:  [id number]   MEDICAL RECORD NO.:  [id number]          PATIENT TYPE:  INP   LOCATION:  6713                         FACILITY:  [facility]   PHYSICIAN:  Juleen China IV, MDDATE OF BIRTH:  [birth year]   DATE OF PROCEDURE:  12/13/2007  DATE OF DISCHARGE:                               OPERATIVE REPORT   PREOPERATIVE DIAGNOSIS:  End-stage renal disease.   POSTOPERATIVE DIAGNOSIS:  End-stage renal disease.   PROCEDURES PERFORMED:  1. Ultrasound-guided access, left cephalic vein fistula x2.  2. Fistulogram.  3. Percutaneous transluminal venoplasty of the subclavian vein.  4. Percutaneous transluminal venoplasty of the cephalic vein.  5. Follow up venogram x3.   INDICATIONS:  This is a 77 year old gentleman who dialyzes through a  left cephalic vein fistula.  Over the weekend, he had significant  bleeding and therefore, he comes in today for a study with possibility  of an intervention.   PROCEDURE:  The patient was identified in the holding area and taken to  room A.  He was placed supine on the table.  The left arm was prepped  and draped in standard sterile fashion.  A time-out was called.  Ultrasound was used to evaluate cephalic vein fistula which was found to  be patent.  Vein was easily compressible.  Lidocaine 1% was used for  local anesthesia.  The cephalic vein at the level of antecubital crease  was accessed in a retrograde fashion with a micropuncture needle under  ultrasound guidance.  Contrast injections were performed to a  micropuncture sheath.   FINDINGS:  Cephalic vein is patent without disease from the antecubital  crease to where it joins into the axillary vein.  There is a stenosis  within the subclavian vein with collaterals indicating that it is  hemodynamically significant.  Otherwise, the central venous system is  patent.  The cephalic vein in the forearm has multiple areas of high-  grade stenosis as well as a  pseudoaneurysm formation.  The most  prominent stenosis is at the level of the arteriovenous anastomosis.  At  the antecubital crease via a median cubital vein, the patient does  divert flow away from the cephalic vein.   With these findings, I elected to treat the central stenosis first.  Over a Wholey wire, an 8-French sheath was placed.  A 14 x 4 Atlas  balloon was advanced across the level of the subclavian stenosis.  It  was taken to 20 atmospheres.  Followup venogram was performed which  revealed suboptimal result but improvement in the stenosis.  Therefore,  I elected to use a larger balloon.  A 16 x 4 Atlas balloon was then  taken across the lesion and inflated to 14 atmospheres.  Followup  venogram revealed improvement in the blood flow across the stenosis with  minimal visualization of the collaterals.  I was satisfied with these  results.  Therefore, I elected to stop this portion of the procedure and  proceed with intervention on the cephalic vein in the forearm.  Balloons  and wires were removed.  A pursestring suture was  used to close the  access site as the patient needed to access as a second access working  towards the hand.  A second access was obtained in the cephalic vein  pointing antegrade towards the hand.  This was done under ultrasound  guidance and micropuncture setup.  An 0.014 wire access was obtained  into the radial artery.  I first dilated the length of the cephalic vein  in the forearm with a 4 x 2 balloon taking it to 10 atmospheres each  time.  Followup study revealed suboptimal, but improved results.  I  elected to use a 6 balloon next.  The cephalic vein was dilated  throughout its course.  The balloon was taken to 10 atmospheres on each  dilation.  Final injection was then performed.  This reveals persistent  disease within the cephalic vein; however, it is improved.  I did not  feel that further intervention at this time was warranted as we had   intervened on the central venous stenosis and I think he should have  adequate access without bleeding complications.  Should he continue to  have persistent bleeding complications, we could be more aggressive in  dilating the cephalic vein.  However, I do not feel it is warranted at  this time.  The 6-French sheath which had been working through was  removed and closed with a pursestring suture.  There were no  complications.   IMPRESSION:  1. Central venous stenosis successfully dilated with a 16-mm balloon.  2. Diffuse stenosis throughout the cephalic vein in the forearm with 2      pseudoaneurysms.  The stenoses were dilated with a 6-mm balloon.      Should the patient continue to have persistent problems with his      fistula, we could be more aggressive with the cephalic vein forearm      venous dilation.           ______________________________  V. Charlena Cross, MD  Electronically Signed     VWB/MEDQ  D:  12/13/2007  T:  12/14/2007  Job:  [id number]   cc:   [facility]

## 2010-06-03 NOTE — Assessment & Plan Note (Signed)
[facility]                            CARDIOLOGY OFFICE NOTE   NAME:Gass, LLEYTON BYERS                     MRN:          [id number]  DATE:02/10/2008                            DOB:          11-19-1928    REASON FOR VISIT:  Cardiomyopathy and congestive heart failure.   HISTORY OF PRESENT ILLNESS:  Mr. Salek is a 77 year old gentleman with  multiple medical problems.  He has end-stage renal disease.  He has  severe cardiomyopathy with LVEF less than 30%.  He has had multiple  recent hospitalizations with altered mental status.  He had a cardiac  arrest in the setting of severe hyperkalemia.  He comes in today with  his son.  The patient denies any symptoms whatsoever.  He specifically  denies chest pain, dyspnea, edema, orthopnea, PND, lightheadedness, or  further syncopal episodes.  He has no complaints.  He inquires about  whether he can begin to drive a car again.  He is currently living with  his oldest son.   MEDICATIONS:  1. Chlorpromazine 25 mg t.i.d.  2. Renovite 1 daily.  3. Cipro 500 mg b.i.d.  4. Coreg 12.5 mg b.i.d.  5. Isosorbide 30 mg daily.  6. PhosLo daily.   ALLERGIES:  NKDA.   PHYSICAL EXAMINATION:  GENERAL:  On exam, the patient is alert and  oriented.  He is a chronically ill-appearing gentleman in no acute  distress.  VITAL SIGNS:  Weight is 140 pounds, blood pressure 110/80, heart rate  63, respiratory rate 16.  HEENT:  Very poor dentition, otherwise normal.  NECK:  Normal carotid upstrokes.  No bruits.  JVP normal.  LUNGS:  Clear bilaterally.  HEART:  Regular rate and rhythm without murmurs or gallops.  ABDOMEN:  Soft, nontender.  No organomegaly.  EXTREMITIES:  No clubbing, cyanosis, or edema.   EKG shows sinus rhythm with first-degree AV block and LVH with  repolarization changes.   ASSESSMENT:  Mr. Fils is a 77 year old gentleman with severe  cardiomyopathy.  He underwent a nuclear study in 2009 that showed  no  evidence of ischemia.  There was moderate-to-large inferior wall infarct  pattern.   I think he should continue with his current medical program.  I really  do not think he is a candidate for an aggressive evaluation or treatment  plan.  I would avoid cardiac cath or other invasive therapies as I  believe his overall prognosis is poor.  He also continues with waxing  and waning mental status.  He inquired about driving in the presence of  his son and I strongly advised against driving.  I think it would be  unsafe for him as well as other drivers for him to be on the road.  The  patient clearly understood this and his son was in full agreement.   For followup, I will see Mr. Venhuizen back in 1 year.  If he has problems  in the interim, I would be happy to see him sooner.  He is followed  closely by Nephrology in the setting of his end-stage renal disease.  Veverly Fells. Excell Seltzer, MD  Electronically Signed    MDC/MedQ  DD: 02/10/2008  DT: 02/11/2008  Job #: [id number]

## 2010-06-03 NOTE — Consult Note (Signed)
NAME:  [name], [name] NO.:  [id number]   MEDICAL RECORD NO.:  [id number]          PATIENT TYPE:  EMS   LOCATION:  MAJO                         [name]   PHYSICIAN:  Charlynne Pander, D.D.S.DATE OF BIRTH:  1928-10-14   DATE OF CONSULTATION:  03/01/2007  DATE OF DISCHARGE:  01/20/2007                                 CONSULTATION   [name] is a 77 year old male referred by Dr. Camille [name]  for a dental consultation.  The patient with end-stage renal disease on  hemodialysis on Monday, Wednesday, and Friday.  Dental consultation  requested to rule out dental infection which may affect the patient's  systemic health.   PAST MEDICAL HISTORY:  1. End-stage renal disease on hemodialysis on Monday, Wednesday, and      Friday.  The patient has an AV fistula in the left arm.  The      patient is followed by Dr. Camille [name].  2. Hyperparathyroidism secondary to kidney failure or kidney disease.  3. Hypertension.  4. COPD.  5. History of congestive heart failure with ejection fraction      approximately 10% by record review.  6. Anemia.  7. History of hypercholesterolemia.  8. Coronary artery disease.  9. Status post right hernia repair surgery in March 2004 by Dr. Marcy [name].  10.History of peptic structures in 2005.  11.History of gastroesophageal reflux disorder.  12.Status post colonoscopy and polypectomy on November 11, 2005 by Dr.      Evette [name].   ALLERGIES:  None known.   MEDICATIONS:  1. Imdur 30 mg at bedtime.  2. Carvedilol 25 mg at bedtime.  3. Chlorpromazine 25 mg three times daily for hiccups.  4. Rena-Vite one daily.  5. Tums as needed occasionally (the patient gets his prescriptions at      [name] [phone]).   SOCIAL HISTORY:  The patient is a widow and has six children.  Patient  with occasional use of alcohol.  Patient with a history of smoking 1/2  to one pack per day for 25 years.  The patient quit approximately  25  years ago by his report.   FAMILY HISTORY:  Mother died at age 44 from old age.  Father died at  age of 75 from old age.   FUNCTIONAL ASSESSMENT:  The patient remains independent for ADL's at  this time.   REVIEW OF SYSTEMS:  This is reviewed with the patient and dental  consultation record.   CHIEF COMPLAINT:  The patient needs evaluation of poor dentition.   HISTORY OF PRESENT ILLNESS:  The patient referred by Dr. Eliott [name] for  evaluation of poor dentition and to rule out dental infection which may  affect the patient's systemic health.   The patient currently denies acute toothache, swellings or abscesses.  The patient notes that he has bad teeth.  The patient was last seen by  a dentist a while ago.  This was approximately 5-10 years ago by his  report.  The patient indicates that he had an acrylic partial denture  fabricated by [name] at that time.  The patient indicates  that the upper acrylic partial denture fits okay.  The patient states  that the lower partial denture fabricated at the same time is broken.   DENTAL EXAMINATION:  GENERAL:  The patient is a well-developed, well-  nourished male in no acute distress.  VITAL SIGNS:  Blood pressure 130/87, pulse 82, temperature 97.  HEAD AND NECK:  There is no palpable lymphadenopathy noted.  The patient  denies acute TMJ symptoms.  DENTAL:  Patient with normal saliva.  The patient has presence of buccal  exostosis in the area of the upper right and upper left quadrants  posteriorly.  Patient with a small mandibular left lingual torus.  DENTITION:  Patient with multiple missing teeth #1, #2, #6, #7, #9, #10,  #16, #18, #19, #20, #23-#31.  Tooth #3 is present as a root segment.  PERIODONTAL:  Patient with chronic advanced periodontal disease with  plaque and calculus accumulations, generalized gingival recession,  generalized tooth mobility, and moderate to severe bone loss.  DENTAL CARIES: There are  multiple dental caries as per dental charting  form.  ENDODONTIC:  Patient currently denies acute pulpitis symptoms.  The  patient appears to have periapical pathology involving teeth #17 and #21  at this time.  CROWN OR BRIDGE:  There are no crown or bridge restorations.  PROSTODONTIC:  The patient has an existing upper acrylic partial denture  which is ill-fitting.  The patient indicates that he broke the lower  acrylic partial denture several years ago..  OCCLUSION:  Patient with a poor occlusal scheme secondary to multiple  missing teeth, supereruption, and drifting of the unopposed teeth into  the edentulous areas, and lack of replacement of the missing teeth with  clinically acceptable dental prosthesis.   RADIOGRAPHIC INTERPRETATION:  Panoramic x-ray was taken and supplemented  with a full series of dental radiographs.   There are multiple missing teeth.  There is moderate to severe bone  loss.  There is supereruption and drifting of the unopposed teeth into  the edentulous areas.  There are dental caries noted.  There is a poor  occlusal scheme noted.  There are periapical radiolucencies associated  with apices of teeth #17 and #21.   ASSESSMENT:  1. History of oral neglect.  2. Chronic periodontitis with bone loss.  3. Generalized gingival recession.  4. Generalized tooth mobility.  5. Chronic apical periodontitis affecting teeth #17 and #21.  6. Presence of upper right and upper left quadrant buccal exostoses.  7. Small mandibular left lingual torus.  8. Multiple missing teeth.  9. Retained root segment in the area #3.  10.Supereruption and drifting of the unopposed teeth into the      edentulous areas.  11.Ill-fitting maxillary acrylic partial denture.  12.Poor occlusal scheme.  13.History of cardiomyopathy and questionable ejection fraction      approximately 10%.  Most likely will need preoperative cardiology      evaluation prior to proceeding with the operating  room dental      extractions with alveoloplasty and use of general anesthesia.   PLAN/RECOMMENDATIONS:  1. I have discussed risks, benefits, complications, and various      treatment options with the patient in relationship to his medical      and dental conditions.  We discussed various treatment options to      include total and subtotal extractions with alveoloplasty and      preprosthetic surgery as indicated, periodontal therapy, dental  restorations, crown or bridge therapy, root canal therapy, implant      therapy, and replacement of missing teeth as indicated after      adequate healing.  The patient currently wishes to proceed with      extraction of all remaining teeth with alveoloplasty and      preprosthetic surgery as indicated in the operating room with      general anesthesia.  The patient is aware that he will need      preoperative evaluation most likely for his cardiology concerns.      Will discuss need for antibiotic premedication prior to invasive      dental procedures with the nephrologist as indicated.  2. Referral to Cardiology as indicated.  3. Discussion of findings with Dr. Camille [name] or other nephrology      attending as indicated.      Charlynne Pander, D.D.S.  Electronically Signed     RFK/MEDQ  D:  03/01/2007  T:  03/02/2007  Job:  [id number]   cc:   [facility]. [name], M.D.

## 2010-06-03 NOTE — Assessment & Plan Note (Signed)
[facility]                            CARDIOLOGY OFFICE NOTE   NAME:Ephraim, DELNO BLAISDELL                     MRN:          [id number]  DATE:07/01/2007                            DOB:          21-Aug-1928    Lawrance Wiedemann was seen in follow-up at the [facility]iology office on  July 01, 2007.  He is a 77 year old gentleman, who I initially saw in  February 2009, for preoperative evaluation.  Dr. Kristin Bruins has planned on  performing a dental extraction under general anesthesia.   Mr. Galik has some advanced cardiac disease.  He has a history of  dilated cardiomyopathy with low ejection fraction.  He does not have a  heart catheterization in the records from [facility], but thinks he may  have had one in the remote past.  He is a very poor historian.  As part  of his preoperative evaluation, he underwent an echocardiogram, as well  as a Myoview stress test.  His Myoview scan showed an EF of 30%, and no  evidence of ischemia and there appears to be moderately large area of  inferior infarction.  His echocardiogram showed a dilated left ventricle  with reduced LV function in the same range with an LVEF of 25%.  There  was also mild valvular disease present.  Since I initially saw him, Mr.  Paulos has had an episode of syncope.  He was admitted to the hospital  for overnight observation.  He was discharged the following day.   Currently, the patient denies chest pain, dyspnea, orthopnea, PND or  recurrent syncope.  He has no complaints at present.   MEDICATIONS:  1. Carvedilol 25 mg at bedtime.  2. Chlorpromazine for hiccups.  3. Rena-Vite.  4. Imdur 30 mg daily, which he is not taking currently.  5. He has a second carvedilol bottle for 6.25 mg t.i.d.  6. Calcium acetate 667 mg 2 t.i.d.   ALLERGIES:  NKDA.   PHYSICAL EXAMINATION:  GENERAL:  The patient is alert and oriented.  He  is in no acute distress.  VITAL SIGNS:  Weight 141 pounds.  Blood  pressure 110/70, heart rate 74,  respiratory rate 16.  HEENT:  Normal.  NECK:  Normal carotid upstrokes without bruits.  Jugulovenous pressure  normal.  LUNGS:  There are diffuse rhonchi and rales in the right lung worse than  the left.  CARDIAC:  The apex is diffuse and laterally displaced.  Regular rate and  rhythm with a 2/6 holosystolic murmur at the apex.  ABDOMEN:  Soft, nontender.  No organomegaly.  EXTREMITIES:  No clubbing, cyanosis or edema.  Peripheral pulses are  intact.   EKG shows normal sinus rhythm with a heart rate of 74 beats per minute,  first-degree AV block and LVH with repolarization abnormality.   ASSESSMENT:  Mr. Laker has cardiomyopathy with a low ejection fraction.  It is not clear whether his myopathy is ischemic or nonischemic.  He  does not have an ischemic Myoview scan, and I do not think we should  proceed with invasive evaluation of his coronaries since he  has no  angina and a nonischemic Myoview.  He is clearly confused about his  medications.  There are some medicines that he has not filled and he has  two different Coreg prescriptions for different dosing.  We have taken  his 25 mg bedtime Coreg bottle and discarded it.  We have asked him to  take Coreg 6.25 mg twice daily since this should not be a three time  daily medicine.  He is to continue to hold his morning Coreg dose on  dialysis days.   Regarding his surgical risk, I think it would be best to avoid general  anesthesia if possible for dental extraction.  He does not have unstable  symptoms, but he clearly has advanced cardiac disease and he has had  syncopal episodes.  He also has end-stage renal disease on dialysis.  The combination of this cardiac and renal disease places him at  significant risk of general anesthesia.  I will be in touch with Dr.  Kristin Bruins to see what options he may have for his dental extraction.  I  would like to see Mr. Ruotolo back in 3 months.     Veverly Fells.  Excell Seltzer, MD  Electronically Signed    MDC/MedQ  DD: 07/01/2007  DT: 07/02/2007  Job #: [id number]   cc:   Charlynne Pander, D.D.S.

## 2010-06-03 NOTE — Discharge Summary (Signed)
NAME:  [name], [name] NO.:  [id number]   MEDICAL RECORD NO.:  [id number]          PATIENT TYPE:  INP   LOCATION:  5504                         FACILITY:  [facility]   PHYSICIAN:  Ladell Pier, M.D.   DATE OF BIRTH:  07-01-1928   DATE OF ADMISSION:  01/07/2008  DATE OF DISCHARGE:                               DISCHARGE SUMMARY   ADDENDUM:  [name] will be discharged today December 22.  Did discuss  with Dr. Eliott Nine, the patient to follow up for hemodialysis on Thursday  and then again on Sunday.  Discussed this as well with the patient.   LABORATORY STUDIES:  Potassium is fine at 4.1.   He will follow up with his primary care doctor, Dr. Ludwig Clarks for followup  for his results of the EEG.      Ladell Pier, M.D.  Electronically Signed     NJ/MEDQ  D:  01/10/2008  T:  01/10/2008  Job:  [id number]   cc:   [facility]  Henri Medal, MD

## 2010-06-03 NOTE — Consult Note (Signed)
NAME:  Jackson [name], Jackson [name] NO.:  [id number]   MEDICAL RECORD NO.:  [id number]          PATIENT TYPE:  INP   LOCATION:  6713                         FACILITY:  [facility]   PHYSICIAN:  Jackson [name], M.D.DATE OF BIRTH:  [birth year]   DATE OF CONSULTATION:  DATE OF DISCHARGE:                                 CONSULTATION   CHIEF COMPLAINT:  Exophytic upper pole mass on the left kidney.  Consult  was called by Dr. Mertha [name].   HISTORY OF PRESENT ILLNESS:  This is a 77 year old African American male  that has been seen in our office in the past by Dr. Wanda [name], last seen  by Dr. Wanda [name] in August 2007.  At that time, he was seen for  borderline criteria for initiating dialysis.  At that time, he had a  borderline elevated PSA of 9.5 with a question of low-grade prostate  infection.  Dr. Wanda [name] recommended a course of 1 month of Cipro 500  daily and would recheck a PSA at that time.  We have not seen him back  in our office at that time, and unfortunately, this patient has  progressed on to hemodialysis.  Jackson [name] was admitted on December 10, 2007, for hypokalemia cardiac risk.  He receives dialysis 3 days a week  and he admits his dialysis prior to hospitalization.  EMS was called to  his house for left-sided weakness and initially was treated as a code  stroke.  He presented to the emergency room for the ER staff and the  patient was talking inappropriate, but had weak left side.  After a head  CT was completed, the patient QRS widening and suffered an acute  cardiopulmonary arrest.  Initial rhythm was PEA with a wide QRS.  The  patient was resuscitated after about 15 minutes in CPR with return of  pulse and blood pressure.  During this time, he was treated with the  usual ACLS medications and also treated with empiric therapy for his  presumed hypokalemia with insulin, glucose, calcium, sodium,  bicarbonate.  He had a good response and was then transferred to  the  unit for intensive care.   PAST MEDICAL HISTORY:  1. Ischemic cardiomyopathy with an ejection fracture of 25-30%.  2. Hypertension.  3. Hyperparathyroidism.  4. COPD, remote smoking.  5. Anemia.  6. Hyperlipidemia.  7. GERD.  8. Gout.  9. History of elevated PSA.  10.History of apical thrombus in the past.  11.History of syncope in 2009.  12.Positive ventricular arrhythmia in the past.   PAST SURGICAL HISTORY:  Significant for right hernia repair and left arm  AV fistula.   SOCIAL HISTORY:  He is a widower, lives alone, has 5 children, former  smoker, denies alcohol or drug use.   MEDICATIONS AT ADMISSION:  1. Coreg 6.25 b.i.d.  2. Imdur 30 mg p.o. daily p.r.n.  3. Promethazine 25 mg p.o. b.i.d. for hiccups.  4. Flexeril 5 mg t.i.d. p.r.n.  5. Vicodin p.r.n.   ALLERGIES:  Denies any known allergies.   REVIEW OF SYSTEMS:  Noncontributory at this point.  All 14  systems were  reviewed with the patient and were noncontributory.   PHYSICAL EXAMINATION:  GENERAL:  A well-developed, well-nourished,  African American male lying in bed with completion of hemodialysis in no  acute distress.  SKIN:  Warm and dry.  HEENT:  Atraumatic, normocephalic.  Pupils equal and reactive to light.  NECK:  Supple.  No JVD.  CHEST:  Some coarse rhonchi bilaterally.  CARDIAC:  Regular rhythm and rate.  ABDOMEN:  Soft, positive bowel sounds.  No CVA punch tenderness.  No  tenderness with palpation.  EXTREMITIES:  No edema.  He has peripheral pulses and he has a left arm  AV fistula with a positive bruit and thrill.  NEUROLOGIC:  He is alert and oriented and conversing at this time.   LABORATORY DATA:  At this time shows a renal function of sodium 138,  potassium 4.2, chloride is 101, CO2 is 26, glucose is 128, BUN is 41,  creatinine is 9.13, GFR is estimated African American is 7, albumin is  2.2, calcium is 8.2, and phosphorus is 4.4.  CBC; WBC is 4.8, RBC is  3.66, hemoglobin is 11.1,  hematocrit is 33.8, and platelet is 117.   X-RAYS:  CT of chest done on December 12, 2007, showed a 1.3 cm  exophytic mass arising from the upper pole of the left kidney,  differential considerations include compact cyst or solid mass.  Ultrasound may provide useful additional information or MRI with and  without contrast and shows bilateral renal atrophy.   ASSESSMENT:  Left exophytic renal mass.   PLAN:  MRI renal protocol to evaluate this mass.  There will be further  determinations after consideration with the MRI.   If you have any questions or concerns, please do not hesitate to call  our office.      Jackson Lout, NP      Jackson [name], M.D.  Electronically Signed    DW/MEDQ  D:  12/14/2007  T:  12/15/2007  Job:  [id number]

## 2010-06-03 NOTE — Assessment & Plan Note (Signed)
OFFICE VISIT   Jackson [name], Jackson [name]  DOB:  May 29, 1928                                       02/04/2010  ZOXWR#:[id number]   Patient presents today, having been contacted from the dialysis center  regarding drainage from his left arm, and they requested that he be seen  today.  He is status post ligation of a forearm AV fistula, repair of  left radial artery, and creation of left upper arm AV fistula by Dr.  Hart Rochester on 12/24/09.   He has excellent early maturation of his left upper arm AV fistula with  excellent size and thrill.  He did have a very aneurysmal, degenerative  fistula in his forearm.  The lower area of pseudoaneurysm has a slight  disruption of the skin and some old blood draining from this.  There is  no evidence of infection.  The upper aneurysmal area has an eschar  present over the skin.  I do not see any evidence of infection.   I discussed this with patient, explaining there is no flow in these  areas and that it would be simply a matter of local wound care as these  heal.   I did explain that if this does develop any inflammation, that he may  require excision of these areas.  He will follow up with Dr. Hart Rochester in 2  weeks for further evaluation.     Larina Earthly, M.D.  Electronically Signed   TFE/MEDQ  D:  02/04/2010  T:  02/05/2010  Job:  [id number]   cc:   [facility]

## 2010-06-03 NOTE — Assessment & Plan Note (Signed)
OFFICE VISIT   Jackson [name], Jackson [name]  DOB:  April 03, 1928                                       10/08/2009  [id number]#:[id number]   The patient is a 77 year old male patient with end-stage renal disease,  dialyzes Tuesday, Thursday and Saturday, well-known to our practice with  previous vascular access in the past.  He was referred today because of  a large right common iliac artery aneurysm discovered recently when he  was being evaluated for left hip pain by Dr. Penni Bombard.  He will be  injected with steroids in the left hip in the near future.  The CT scan  discovered that he does have a 3.9 x 3.3 cm right common iliac artery  aneurysm involving the proximal right internal iliac artery as well.  I  reviewed the CT scan today.  He has small left hip joint effusion.  The  previous scan in the past has revealed the aneurysm measuring only 2.7  cm so it has enlarged significantly.   CHRONIC MEDICAL PROBLEMS:  1. End-stage renal disease hemodialysis for 6 years on Tuesday,      Thursday, Saturday.  2. Hypertension.  3. Left hip pain.  4. COPD.  5. History of congestive heart failure.  6. Negative for coronary artery disease, diabetes or stroke.   SOCIAL HISTORY:  Retired, has 9 children, is widowed.  Has not smoked in  40 years.  Does not use alcohol.   FAMILY HISTORY:  Negative for diabetes, coronary artery disease or  stroke.   REVIEW OF SYSTEMS:  Positive for reflux esophagitis, chronic kidney  failure, arthritis, some lower extremity discomfort with walking.  All  other systems negative.   PHYSICAL EXAMINATION:  Blood pressure 113/70, heart rate 90,  respirations 14, temperature 97.  General:  Well-developed, thin male in  no apparent distress, alert and x3.  HEENT:  Exam normal for age.  EOMs  intact.  Lungs:  Clear to auscultation.  No rhonchi or wheezing.  Cardiovascular:  Regular rhythm no murmurs.  Carotid pulses, 3+ no  bruits.  Abdomen:  Soft,  nontender with no masses.  Musculoskeletal:  Free of major deformities.  Lower extremity exam reveals 3+ femoral and  popliteal pulses palpable.  Both feet are well-perfused.   Today I reviewed the clinical information supplied by Dr. Penni Bombard and Dr  Arrie Aran and also reviewed the CT scan which was a noncontrast CT of  the pelvis.  He does have a large iliac aneurysm which needs treatment  to prevent rupture.  The question is whether it is too redundant to be  treated with a stent graft since the proximal external iliac is quite  redundant.  We will schedule him for a formal CT angiogram of the  abdominal and iliac arteries with contrast in the near future and an  angiogram to be done by Dr. Myra Gianotti on October 5 to see if embolization  of the right internal iliac artery could be accomplished.  If so we will  attempt treatment with a stent graft at his right common iliac artery  depending on the above findings.  I discussed this today at length with  him and his daughter.     [name], M.D.  Electronically Signed   JDL/MEDQ  D:  10/08/2009  T:  10/09/2009  Job:  [id number]

## 2010-06-06 NOTE — Consult Note (Signed)
NAME:  Jackson [name], Jackson [name] NO.:  [id number]   MEDICAL RECORD NO.:  [id number]          PATIENT TYPE:  INP   LOCATION:  5511                         FACILITY:  [facility]   PHYSICIAN:  Graylin Shiver, M.D.   DATE OF BIRTH:  1928-11-03   DATE OF CONSULTATION:  11/10/2005  DATE OF DISCHARGE:                                   CONSULTATION   REASON FOR CONSULTATION:  The patient is a 77 year old black male with a  history of renal failure on dialysis.  He began to experience bright red  blood per rectum this morning.  He described it to be a bloody diarrhea type  of stool.  He became alarmed and came to the hospital, where he was  subsequently admitted.  He has no complaint of abdominal pain.  He denies  vomiting.  He had dialysis today and did well.   He states that he had a colonoscopy a year or two ago by Dr. Corinda Gubler and  nothing was found.   Hemoglobin is 14, hematocrit is 42.3, platelets 176,000.  INR 1.2.   PAST HISTORY:  Allergies:  None known.   Medications:  Norvasc, metoprolol, Tums, Spiriva, aspirin, EPO, Hectorol.   Past history significant COPD, renal failure, dilated cardiomyopathy,  anemia, elevated PSA, history of peptic ulcer disease.   REVIEW OF SYSTEMS:  He is not complaining of lightheadedness or dizziness,  not complaining of chest pain or shortness of breath.   PHYSICAL EXAMINATION:  VITAL SIGNS:  Stable.  He does not appear in any  acute distress.  HEENT:  He is nonicteric.  NECK:  Supple.  HEART:  Regular rhythm.  No murmurs.  LUNGS:  Clear.  ABDOMEN:  Bowel sounds are normal.  It is soft, nontender.  There is no  hepatosplenomegaly   IMPRESSION:  Lower gastrointestinal bleed, etiology unclear.  He does not  appear to be hemodynamically unstable.  His hemoglobin is 14.   COMMENTS:  I do not know what the cause of the patient's GI bleeding is at  this time.  There could be an underlying malignancy or adenomatous polyp,  diverticulosis.  I do  not have his prior colonoscopy report, and we always  have to be concerned of new abnormalities even if his colonoscopy was  negative a year or two ago.   PLAN:  We will proceed with colonoscopy to further evaluate.           ______________________________  Graylin Shiver, M.D.     SFG/MEDQ  D:  11/10/2005  T:  11/11/2005  Job:  [id number]   cc:   Garnetta Buddy, M.D.  Cassell Clement, M.D.

## 2010-06-06 NOTE — Op Note (Signed)
NAME:  Jackson [name], BOOKWALTER NO.:  [id number]   MEDICAL RECORD NO.:  [id number]                   PATIENT TYPE:  OIB   LOCATION:  4727                                 FACILITY:  [facility]   PHYSICIAN:  Lorre Munroe., M.D.            DATE OF BIRTH:  1928/07/10   DATE OF PROCEDURE:  03/25/2002  DATE OF DISCHARGE:                                 OPERATIVE REPORT   PREOPERATIVE DIAGNOSES:  1. Umbilical hernia.  2. Indirect right inguinal hernia.   POSTOPERATIVE DIAGNOSES:  1. Umbilical hernia.  2. Indirect right inguinal hernia.   OPERATION:  1. Repair of umbilical hernia.  2. Repair of right inguinal hernia.   SURGEON:  Lebron Conners, M.D.   ANESTHESIA:  General.   DESCRIPTION OF PROCEDURE:  After the patient was monitored and anesthetized  and had routine preparation and draping of the abdomen and groins, I made a  transverse incision over the large umbilical hernia and dissected down to  the hernia sac and dissected the sac away from the skin and surrounding fat  down to its neck. It contained a large amount of omentum which was not  easily reducible. I excised the sac at the neck of the fascia and then got  hemostasis where I had excised it. It was obvious that the omentum would not  reduce through the defect as it was so I enlarged it in the midline until I  could reduce the incarcerated omentum. All of it appeared to be viable and  healthy. There was a bit of fluid in the sac but did not appear infected or  otherwise abnormal. I then mobilized the skin and subcutaneous tissues off  the fascia in all directions from the defect which I had created and I  closed the defect with running #0 Prolene suture. I then cut a piece of  Marlex mesh about 3 x 5 cm in size and oval and I sewed it in with a running  basting 2-0 Prolene stitch put in 2-3 cm from the edges of the repair. I  felt that it was a secure repair. I then got good hemostasis. I put in  a  suction drain and then secured that to the skin with 2-0 silk suture. I  sutured the umbilical skin down to the mesh. I trimmed away redundant skin  and closed the skin with staples. I used local anesthetic in addition to the  general anesthetic. I already had anesthetized the right groin and I made an  oblique incision just above and to the right of the pubic tubercle and  dissected down to the fat until I came to the external oblique. I opened the  external oblique in the direction of its fibers into the external ring  taking care to avoid injury to the ilioinguinal nerve. I mobilized the  spermatic cord and encircled it with a Penrose drain and dissected away  cremaster until the spermatic cord and internal ring were clear. There was  no direct hernia. I opened the cord longitudinally and dissected out a quite  large indirect sac and dissected it free of the other cord structures taking  care to avoid injury to the artery, the veins and the vas. I then reduced  the sac and held it in place with a plug of polypropylene mesh held in place  with a 2-0 silk stitch. I fashioned a patch of polypropylene mesh with a  slit in it to allow the spermatic cord to exit the body wall and sewed that  in from the pubic tubercle medially and superiorly with a running basting 2-  0 Prolene stitch and laterally and inferiorly with a running 2-0 Prolene  stitch in the inguinal  ligament. I used one stitch to join the tails of the mesh together lateral  to the cord and concluded the procedure. I put in a little extra local  anesthetic and closed with separate layers of 3-0 Vicryl in the external  oblique and the subcutaneous tissues and staples for the skin. The patient  tolerated the operation well.                                               Lorre Munroe., M.D.    Jodi Marble  D:  03/24/2002  T:  03/25/2002  Job:  [id number]

## 2010-06-06 NOTE — Consult Note (Signed)
NAME:  Jackson [name], Jackson [name] NO.:  [id number]   MEDICAL RECORD NO.:  [id number]          PATIENT TYPE:  INP   LOCATION:  3735                         FACILITY:  [facility]   PHYSICIAN:  Garnetta Buddy, M.D.   DATE OF BIRTH:  Aug 05, 1928   DATE OF CONSULTATION:  02/25/2004  DATE OF DISCHARGE:                                   CONSULTATION   REFERRING PHYSICIAN:  Geoffry Paradise, M.D.   REASON FOR CONSULTATION:  Elevated serum creatinine.   HISTORY OF PRESENT ILLNESS:  A 77 year old African-American male admitted  with a right lobe pneumonia, chronic renal failure, and dilated  cardiomyopathy with ejection fraction of 10%, to be followed by Dr. Caryn Section,  whose GFR is less than 50 cc/min.   PAST MEDICAL HISTORY:  1.  Hypertension.  2.  Dilated cardiomyopathy.  3.  Mitral regurgitation.  4.  Chronic obstructive pulmonary disease.  5.  Chronic renal failure, stage 5.  6.  History of inguinal hernia repair in 2004.  7.  History of AV fistula in July 2005.   MEDICATIONS:  1.  Albuterol inhaler q.i.d.  2.  Norvasc 10 mg daily.  3.  Aspirin 81 mg daily.  4.  Lasix 120 mg b.i.d.  5.  Atrovent inhaler q.i.d.  6.  Toprol 25 mg daily.  7.  Avelox 400 mg daily.  8.  Spironolactone 25 mg b.i.d.   ALLERGIES:  No known drug allergies.   SOCIAL HISTORY:  Single, widowed, retired, 10 children.  Quit tobacco, no  alcohol.   FAMILY HISTORY:  No ESRD, otherwise noncontributory.   REVIEW OF SYSTEMS:  GENERAL:  Fever and chills x2 to 3 days.  EYES:  No  visual complaints, denies eye pain, denies double vision, denies blurry  vision.  EARS, NOSE, MOUTH, THROAT:  Denies hearing loss, sore throat, runny  nose, sinusitis.  CARDIOVASCULAR:  Denies angina.  Admits to dyspnea and  orthopnea.  Denies ankle or leg swelling, admits to no syncope.  No  claudication.  RESPIRATORY:  He admits to a dry minimally productive cough.  No chest pain.  ABDOMEN:  No nausea, appetite good, bowels movements  are  normal with no blood mixed in stools.  GENITOURINARY:  No history of renal  calculi, no urgency or dysuria.  MUSCULOSKELETAL:  No history of gout.  _____________anti-inflammatory drugs.  HEMATOLOGIC/ONCOLOGIC:  No anemia, no  cancer, no easy bruisability.  ENDOCRINE:  No diabetes, no thyroid, no  history of DVT or pulmonary embolus.  DERMATOLOGIC:  No rashes.  NEUROLOGIC:  No stroke or seizures.  No numbness sensation of his upper and lower  extremities.   PHYSICAL EXAMINATION:  GENERAL:  Alert, non-distressed, resting comfortably.  VITAL SIGNS:  Blood pressure 180/110, pulse 80, temperature afebrile.  HEENT:  Bilateral arcus senilis.  Extraocular movements were intact.  Ears,  nose, mouth, and throat appearance was normal.  Oropharynx was clear.  NECK:  Supple.  JVD elevated.  Prominent V-waves 11 cm above the angle of  ___________.  Supple, no lymph nodes.  CARDIOVASCULAR:  Regular rate and rhythm.  Systolic murmur 3/6.  RESPIRATORY:  Decreased air entry.  Rales, rhonchi at bases.  ABDOMEN:  Soft, bowel sounds present, there is no organomegaly.  EXTREMITIES:  2+ peripheral pulses.  AV fistula on the left.  NEUROLOGIC:  Alert and oriented, symmetrical reflexes.   LABORATORY DATA:  Sodium 139, potassium 4.3, chloride 109, CO2 23, BUN 72,  creatinine 4.9, glucose 88, calcium 8.  WBC 3.8, hemoglobin 13.1, platelets  195.  LFT's within normal range.   ASSESSMENT AND PLAN:  1.  Near end-stage renal disease.  Appears very well compensated.      Functioning AV fistula, no overt uremic symptoms.  Check renal      ultrasound and renal urine microscopy.  Volume diuresing well.  Continue      Lasix at 120 mg b.i.d.  2.  Hypertension.  We will continue to follow, avoid ACE and ARB therapy due      to decreased GFR.  I recommend nitrites and hydralazine therapy in this      gentleman.  3.  Bones.  Check PTH.  4.  Nutrition.  Continue Nephro-Vite one daily.  5.  Congestive heart failure.   Check echocardiogram.  6.  Lipids in the a.m.  7.  Anemia.  No Epogen.      MWW/MEDQ  D:  02/25/2004  T:  02/25/2004  Job:  [id number]

## 2010-06-06 NOTE — Consult Note (Signed)
NAME:  Jackson [name], Jackson [name] NO.:  [id number]   MEDICAL RECORD NO.:  [id number]                   PATIENT TYPE:  OIB   LOCATION:  4727                                 FACILITY:  [facility]   PHYSICIAN:  Barry Dienes. Eloise Harman, M.D.            DATE OF BIRTH:  Feb 06, 1928   DATE OF CONSULTATION:  03/24/2002  DATE OF DISCHARGE:  03/25/2002                                   CONSULTATION   REQUESTING PHYSICIAN:  Lebron Conners, M.D.   REASON FOR CONSULTATION:  Extreme lethargy and prolonged ventilator support  after hernia surgery today.   HISTORY OF PRESENT ILLNESS:  The patient is a 77 year old black male, who is  well known to me.  He has multiple medical problems including severe dilated  cardiomyopathy with a left ventricular ejection fraction of 10%,  hypertension, chronic obstructive pulmonary disease, valvular heart disease  with moderate mitral regurgitation, moderate to severe tricuspid  regurgitation, and mild aortic insufficiency.  He also has chronic renal  insufficiency with a February 17, 2002, BUN and creatinine level of 47 and  2.5, respectively.   Today, after repair of a large umbilical hernia and right inguinal hernia,  he required over two hours of ventilator support and was very lethargic, so  he was admitted for overnight observation.  At approximately 1730 hours  today, he had two minutes of markedly decreased blood pressure with the  systolic reading 80 and diastolic palpable associated with his eyes rolled  and gurgling breathing.  This resolved with bringing his head down briefly.   Currently, he is at his usual self, chatting with family members and  attendants.  He denies shortness of breath or chest pain.  He had some  supper this evening without difficulty.  He has minimal tenderness at his  surgical site.   PAST MEDICAL HISTORY:  Left epistaxis that required posterior nasal packing  in May 2003 and difficulties with medical  noncompliance and following  complicated regimens for his medications.   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  He has no history of tobacco or alcohol abuse.   MEDICATIONS PRIOR TO ADMISSION:  1. Lasix 80 mg p.o. daily.  2. Norvasc 5 mg p.o. b.i.d.  3. Toprol XL 25 mg p.o. daily.  4. Inspra 25 mg p.o. daily.  5. Coumadin 2 mg p.o. daily.  6. Darvocet-N 100 1 tab p.o. q.i.d. p.r.n. pain.  7. Ambien 10 mg p.o. q.h.s. p.r.n. pain.   ALLERGIES:  Past use of COREG has been associated with significant wheezing.  He has no other drug allergies.   PHYSICAL EXAMINATION:  VITAL SIGNS:  Blood pressure 110/78, pulse 80,  respiratory rate 20, temperature 99, oxygen saturation 99% on 4 liters per  minute nasal cannula.  GENERAL:  He is an elderly black male in no apparent distress while sitting  at  45 degree elevation of the head of bed.  HEENT:  Significant for mild, right-sided ptosis which is his baseline.  NECK:  Jugular venous distension halfway to the angle of the jaw sitting up  and no carotid bruit.  CHEST:  Clear to auscultation.  HEART:  Regular rate and rhythm.  S1 and S2 are present.  There is a  systolic ejection murmur grade 1/6 at the left sternal border.  ABDOMEN:  Normal bowel sounds with no unusual tenderness or guarding.  EXTREMITIES:  Bilateral 2+ pitting edema of the legs.  NEUROLOGIC:  He is alert and oriented x 3 and able to move four extremities  well.   LABORATORY STUDIES:  White blood cell count on March 2, was 5.3, hemoglobin  14, hematocrit 42, platelet count 165.  Serum sodium 135, potassium 5.1,  chloride 103, CO2 22, BUN 60, creatinine 3.7, glucose 104, serum albumin  2.6.  EKG telemetry during his period of hypotension was normal sinus rhythm  with nonspecific ST segment and T-wave abnormalities.   IMPRESSION AND PLAN:  1. Hypotension.  This is normalizing and likely secondary to poor  clearance     of his anesthetic drugs due to his renal insufficiency.  I agree with     previously written orders to hold his usual blood pressure medications     and diuretics until the morning.  2. Congestive heart failure.  This is stable on his current regimen.  3. Chronic renal insufficiency.  He has had an interval increase in his BUN     and creatinine, and creatinine from January 2004; however, previous labs     in August 2003 included a BUN and creatinine of 70 and 3.4, respectively.     He will need to schedule a follow-up evaluation with his nephrologist in     the next several weeks for reevaluation of this tenuous renal function.                                               Barry Dienes Eloise Harman, M.D.    DGP/MEDQ  D:  03/24/2002  T:  03/26/2002  Job:  [id number]   cc:   Wilber Bihari. Caryn Section, M.D.  [address]  [place]  [place] [address]  Fax: [phone]   Cassell Clement, M.D.  [address], [address]  [place]  [place] [address]  Fax: [phone]

## 2010-06-06 NOTE — Op Note (Signed)
NAME:  Jackson [name], Jackson [name] NO.:  [id number]   MEDICAL RECORD NO.:  [id number]          PATIENT TYPE:  INP   LOCATION:  5511                         FACILITY:  [facility]   PHYSICIAN:  Graylin Shiver, M.D.   DATE OF BIRTH:  05-Aug-1928   DATE OF PROCEDURE:  11/11/2005  DATE OF DISCHARGE:                                 OPERATIVE REPORT   PROCEDURE:  Colonoscopy with polypectomy.   INDICATIONS FOR PROCEDURE:  Rectal bleeding.   Informed consent was obtained after explanation of the risks of bleeding,  infection and perforation.   PREMEDICATION:  62 mcg fentanyl IV, 5 mg Versed IV.   PROCEDURE:  With the patient in the left lateral decubitus position, a  rectal exam was performed.  No masses were felt.  The Olympus colonoscope  was inserted into the rectum and advanced around the colon to the cecum.  Cecal landmarks were identified.  The cecum and ascending colon were normal.  The transverse colon was normal.  The descending colon and sigmoid showed  scattered diverticula.  In the distal sigmoid, there was a 5-mm sessile  polyp which was snared and removed by snare cautery technique.  The cautery  site looked good.  The polyp was retrieved.  The rectum looked normal.  Some  internal hemorrhoids were seen as the scope was brought out.  He tolerated  the procedure well without complications.   IMPRESSION:  1. Diverticulosis.  2. Sigmoid polyp.3  3. Internal hemorrhoids.           ______________________________  Graylin Shiver, M.D.     SFG/MEDQ  D:  11/11/2005  T:  11/12/2005  Job:  [id number]   cc:   Garnetta Buddy, M.D.

## 2010-06-06 NOTE — Discharge Summary (Signed)
NAME:  Jackson, [name] NO.:  [id number]   MEDICAL RECORD NO.:  [id number]          PATIENT TYPE:  INP   LOCATION:  5511                         FACILITY:  [facility]   PHYSICIAN:  Jackson [name], M.D.   DATE OF BIRTH:  [birth year]   DATE OF ADMISSION:  11/10/2005  DATE OF DISCHARGE:  11/12/2005                               DISCHARGE SUMMARY   DISCHARGE DIAGNOSES:  1. Lower gastrointestinal bleed, resolved.  2. Hypertension.  3. Anemia of chronic disease.  4. End-stage renal disease.   PROCEDURES:  1. November 11, 2005, colonoscopy with polypectomy take to me by Dr.      Herbert [name], impression:  Diverticulosis, sigmoid polyp, and      internal hemorrhoids  2. Hemodialysis.   CONSULTATION:  November 10, 2005, Dr. Herbert [name].   HISTORY OF PRESENT ILLNESS:  Mr. Jackson [name] is a 77 year old gentleman who  presents with a short term history of bright red blood noted per rectum  on using the bathroom.  He had diarrhea and loose stool.  He denies any  fever, sweats, or chills.  He has had no history of colonoscopy in the  past.  He has had a history of Barrett esophagus with EGD.  He had an  abdominal hernia repaired in the remote past but no recent abdominal  surgery.  He was on Coumadin at one time for left apical thrombus but is  no longer using Coumadin.   ADMISSION LABORATORY:  WBC 7.2, hemoglobin 15.1, platelets 205.  Admission chest x-ray, impression:  Asymmetrical right lung density.  Question infiltrate, atelectasis, effusion.   HOSPITAL COURSE:  1. Lower gastrointestinal bleed.  Initially the patient's heparin was      held with dialysis as well as his aspirin.  Dr. Evette [name] was consulted      and a colonoscopy was performed with results above.  Etiology      remained unclear this hospital stay, given no evidence of bleed on      colonoscopy.  He remained hemodynamically stable during this      admission with a discharge hemoglobin of 12.9, and he also remained     asymptomatic.  There was no further intervention.  2. Hypertension.  The patient continued on his Norvasc as well as his      metoprolol.  Blood pressures were within goal and there was no      further interventions.  3. Anemia of chronic disease.  Please see number one.  Of note, the      patient continued on Aranesp every week.  4. End-stage renal disease.  The patient underwent one hemodialysis      session on November 10, 2005, without incident.   DISCHARGE MEDICATIONS:  1. Norvasc 10 mg q.h.s.  2. Spiriva 18 mcg one inhaled daily.  3. Metoprolol 25 mg b.i.d. (hold a.m. dose on dialysis days).  4. Tums 500 mg one p.o. a.c. h.s.  5. Imdur 60 mg ER q.h.s.  6. Resume aspirin 325 mg daily.  7. Hydralazine 10 mg one as needed.   HEMODIALYSIS INSTRUCTIONS:  1. The patient's  new estimated dry weight will be 64.5 kg.  2. He will continue Hectorol 3.5 mcg at each dialysis treatment.  3. He will continue Epogen 11,000 units at each hemodialysis      treatment.  4. He also is to remain on tight heparin.  5. He is to let us know if there are any additional signs of bleeding      or concerns.  He will let the dialysis center know.      Jackson [name], [place]      Jackson [name], M.D.  Electronically Signed    MY/MEDQ  D:  12/15/2005  T:  12/15/2005  Job:  [id number]   cc:   [facility]  [facility]  Graylin Shiver, M.D.

## 2010-06-06 NOTE — H&P (Signed)
NAME:  Jackson [name], Jackson [name] NO.:  [id number]   MEDICAL RECORD NO.:  [id number]          PATIENT TYPE:  INP   LOCATION:  5511                         FACILITY:  [facility]   PHYSICIAN:  Garnetta Buddy, M.D.   DATE OF BIRTH:  1928-05-05   DATE OF ADMISSION:  11/10/2005  DATE OF DISCHARGE:                                HISTORY & PHYSICAL   CHIEF COMPLAINT:  Bright red blood per rectum.   HISTORY OF PRESENTING ILLNESS:  This is a 77 year old gentleman 2-1/2 months  on hemodialysis presented with short episode of bright red blood noted per  rectum on using the bathroom.  He had diarrhea, loose stools.  He denied any  fever, sweats, or chills.  He has no history of colonoscopy in the past.  He  has a history of Barrett esophagus with EGD.  He had an abdominal hernia  repaired in the remote past, but no recent abdominal surgery.  He was on  Coumadin at one time for left apical thrombus, but is no longer using  Coumadin.   PAST MEDICAL HISTORY:  1. History of COPD.  2. History of coronary artery disease.  3. History of cardiomyopathy, EF 10%.  4. History of increased PSA>  5. History of right inguinal hernia.  6. History of anemia.  7. History of secondary hyperparathyroidism.  8. History of peptic stricture 2005.  9. History of left apical thrombus.  10.History of nephrosclerosis, end-stage renal disease 2-1/2 months.  11.History of gastroesophageal reflux disease.   MEDICATIONS:  1. Norvasc 10 mg nightly.  2. Metoprolol 25 mg b.i.d.  3. Tums with meals.  4. Spiriva 1 q. day.  5. Aspirin 325 mg q. day.  6. Epogen 11,000 units q. dialysis.  7. Hectorol 3.5 mcg q. dialysis.   FAMILY HISTORY:  Negative for end-stage renal disease.   SOCIAL HISTORY:  No tobacco.  No alcohol.  Lives at home.  Five children.   REVIEW OF SYSTEMS:  GENERAL:  Denies fatigue, fever, sweats, chills.  EYES:  Denies visual complaints, blurred vision.  Wears glasses.  EARS, NOSE,  MOUTH,  THROAT:  Denies hearing loss, epistaxis, sore throat.  CARDIOVASCULAR:  Denies anginal chest pain, orthopnea.  No ankle, leg  swelling, or syncope.  RESPIRATORY:  Denies cough, wheezes, hemoptysis,  history of COPD, dyspnea on exertion.  ABDOMEN:  As per HPI.  UROGENIC:  He  makes urine.  Denies any dysuria, frequency, or urgency.  NEUROLOGICAL:  No  stroke, TIAs, numbness, tingling, pins or needles, dysarthria, dysphagia,  dystonia.  Denies any history of seizure disorders.  MUSCULOSKELETAL:  Denies any history of gout or use of nonsteroidal antiinflammatory drugs.  HEMATOLOGIC/ONCOLOGIC:  History of anemia using Epogen for end-stage renal  disease.  ENDOCRINE:  History of secondary hyperparathyroidism.  No history  of diabetes.   PHYSICAL EXAMINATION:  GENERAL:  Alert.  A pleasant gentleman in no obvious  distress.  VITAL SIGNS:  Blood pressure 121/81, pulse 72, temperature 96.1.  HEAD AND EYES:  Normocephalic, atraumatic.  Pupils are senile meiosis,  bilateral arcus senilis.  EARS, NOSE, MOUTH, THROAT:  Oropharynx was normal.  Nasal mucosa clear.  Oropharynx was clear.  NECK:  Supple.  No thyromegaly or adenopathy.  No JVD.  No carotid bruits.  CARDIOVASCULAR:  No heaves, thrills, rubs, gallops.  Regular rate and  rhythm.  RESPIRATORY:  Lung fields clear to auscultation and percussion.  It was  resonant throughout.  ABDOMEN:  Soft, nontender.  Bowel sounds present.  No organomegaly.  No  abdominal hernias.  Bowel sounds are present.  No abdominal bruits.  EXTREMITIES:  Revealed negative bruits in the femorals.  Dorsalis pedis,  posterior tibial were nonpalpable.  DERMATOLOGIC:  Skin is dry.  NEUROLOGICAL:  Nonfocal with diminished attenuated reflexes and downgoing  Babinski.   LABS:  CMET phosphorus pending.  WBC 7.2, hemoglobin 15.1, platelets 205.   ASSESSMENT AND PLAN:  1. Acute lower gastrointestinal bleed.  Will hold aspirin and heparin.      Gastrointestinal consult.  2.  End-stage renal disease.  Hemodialysis for hyperkalemia.  Potassium      level of 6.8.  No EKG changes.  3. Chronic obstructive pulmonary disease.  Continue Spiriva.  4. History of cardiomyopathy.  Will continue dialysis for ultrafiltration.  5. Nutrition.  Will continue renal diet 80/2/2.      Garnetta Buddy, M.D.  Electronically Signed     MWW/MEDQ  D:  11/10/2005  T:  11/10/2005  Job:  [id number]

## 2010-06-06 NOTE — H&P (Signed)
NAME:  Jackson [name], Jackson [name] NO.:  [id number]   MEDICAL RECORD NO.:  [id number]          PATIENT TYPE:  EMS   LOCATION:  MAJO                         FACILITY:  [facility]   PHYSICIAN:  Jackson [name], M.D.  DATE OF BIRTH:  07/17/1928   DATE OF ADMISSION:  02/24/2004  DATE OF DISCHARGE:                                HISTORY & PHYSICAL   CHIEF COMPLAINT:  Cough, congestion, shortness of breath.   HISTORY OF PRESENT ILLNESS:  Jackson [name] is a 77 year old gentleman with  hypertension, dilated cardiomyopathy with EF of 10%, COPD, valvular heart  disease with moderate mitral regurgitation, chronic renal insufficiency  approaching end-stage renal disease.  He is presenting at this time with  increasing cough, congestion, and shortness of breath. His last  hospitalization was basically an outpatient procedure to allow placement of  an AV fistula in the left upper extremity in anticipation of upcoming  dialysis.  He is followed regularly by Jackson [name] with [facility] and evidently he and Jackson [name] have had dialogs related to  upcoming dialysis with it being anticipated within the next three to nine  months.  This having been said, he has been in baseline health, fairly  independent, residing with his grandson.  Approximately one week prior to  admission, he developed cold, cough, and congestion with fairly prominent  green phlegm both from his head and chest.  He has had no fever but has  developed progressive dyspnea superimposed upon this resulting in  presentation to the emergency room.  He has not contacted any of his  physicians prior to this and has been trying to manage with basically  outpatient over-the-counter medications.   ALLERGIES:  No known drug allergies.   MEDICATIONS:  1.  Lasix 80 mg p.o. b.i.d.  2.  Aspirin 81 mg q.d.  3.  Toprol XL 25 mg q.d.  4.  Spironolactone 25 mg q.d.  5.  Norvasc 5 mg p.o. b.i.d.   PAST MEDICAL HISTORY:  1.   Essential hypertension.  2.  Dilated cardiomyopathy with EF of 10%.  3.  Chronic renal failure with BUN and creatinine in January of 2004 of 47      and 2.5.  BUN and creatinine March 2004 is 60 and 3.7 and most recently      by his report creatinine has been in the mid-4's.  4.  COPD.  5.  Mitral regurgitation.   PAST SURGICAL HISTORY:  1.  Posterior nose packing May 2003.  2.  Inguinal hernia repair in 2004.  3.  AV fistula July 2005.   SOCIAL HISTORY:  The patient is single, retired from his work in the city as  a Proofreader.  Does not currently smoke or drink.  Does live with  his grandson.   PHYSICAL EXAMINATION:  VITAL SIGNS:  Temperature 98.7, blood pressure  150/110, heart rate 90, respiratory rate is 20.  GENERAL:  The patient is alert, awake, no distress, pleasant, conversant.  No dyspnea upon conversing.  He is wearing an O2 at two liters nasal  cannula.  HEENT:  Good facial symmetry.  Anicteric.  Murky sclerae.  Fundi not  visualized.  Oropharynx benign.  NECK:  No JVD or bruits.  LUNGS:  Right lower lung field wheezing and rhonchi. Otherwise clear in left  and upper lung fields.  CARDIOVASCULAR:  Regular rate and rhythm. A 3/6 systolic ejection murmur  left sternal border and apex.  CHEST:  Chest wall negative.  ABDOMEN:  Soft, nontender, and nondistended.  No hepatosplenomegaly.  EXTREMITIES:  No clubbing, cyanosis, or edema.  Poor distal pulses.  Warm  distally.  Left upper extremity with palpable fistula.  GENITOURINARY:  Not done.  RECTAL:  Not done.  NEUROLOGICAL:  Higher cortical functioning is intact.  He is not  lateralizing.  Gait was not assessed.   LABORATORY DATA:  Chest x-ray:  Right lower lung infiltrate compatible with  pneumonia, superimposed on mild baseline vascular congestion and small  effusions.  EKG shows sinus rhythm.  ST and T-wave changes appear  chronically.   ASSESSMENT:  1.  Probable right lower lobe pneumonia,  acute/subacute.  2.  Mild pulmonary edema with effusion secondary to dilated cardiomyopathy.  3.  Essential hypertension, compliance and control unclear.  4.  Chronic renal failure approaching end-stage renal disease with already      placed left arteriovenous fistula.  5.  Chronic obstructive pulmonary disease.   PLAN:  The patient will be admitted for increasing diuresis but more  importantly bronchodilator, O2 therapy, and antibiotics further pending his  course.      RA/MEDQ  D:  02/24/2004  T:  02/24/2004  Job:  [id number]

## 2010-06-06 NOTE — Consult Note (Signed)
   NAME:  Jackson [name], Jackson [name] NO.:  [id number]   MEDICAL RECORD NO.:  [id number]                   PATIENT TYPE:  EMS   LOCATION:  MAJO                                 FACILITY:  [facility]   PHYSICIAN:  Ollen Gross. Vernell Morgans, M.D.              DATE OF BIRTH:  November 07, 1928   DATE OF CONSULTATION:  04/02/2002  DATE OF DISCHARGE:                                   CONSULTATION   CHIEF COMPLAINT:  I've got something sticking out of me.   HISTORY OF PRESENT ILLNESS:  Briefly, the patient is a 77 year old gentleman  who underwent an umbilical hernia repair by Dr. Orson Slick.  He was doing well  at home and I think he misunderstood his postoperative instructions to  remove his bandages on postoperative day #4 and at that time he also could  not pull his drain.  He tried to pull his drain that he had in his wound,  could not pull it because it was anchored to his skin, so he took a knife  and cut the drain in half.  He had some oozing from the drain which  concerned him and he came to the emergency department to have this evaluated  today.   REVIEW OF SYSTEMS:  He otherwise denies any nausea, vomiting, fevers,  chills, chest pain, shortness of breath, diarrhea or dysuria.   PAST SURGICAL HISTORY:  This includes umbilical hernia repair.   PAST MEDICAL HISTORY:  This includes aortic insufficiency, COPD, heart  disease, hypertension, and cardiomyopathy.   MEDICATIONS:  This includes Lasix, Norvasc, Toprol, INSPRA, Coumadin,  Darvocet and Ambien.   ALLERGIES:  COREG.   PHYSICAL EXAMINATION:  GENERAL APPEARANCE:  He was an elderly black male in  no acute distress.  SKIN:  Warm and dry with no jaundice.  VITAL SIGNS:  He was afebrile with normal vital signs.  LUNGS:  Clear.  ABDOMEN:  Soft.  His wound appeared to be healing nicely.  There was no  evidence of infection.  He had a drain sticking out of the lateral edge of  his abdomen.  The anchoring stitch was cut with scissors  and the drain was  removed and dressings were applied.   I have instructed him on how to care for his wounds.  Otherwise he seems to  be doing well.  We will have him return home and follow up with Dr. Orson Slick  this week in the clinic.                                                Ollen Gross. Vernell Morgans, M.D.    PST/MEDQ  D:  04/02/2002  T:  04/03/2002  Job:  [id number]

## 2010-06-06 NOTE — Op Note (Signed)
NAME:  ALIJAH, AKRAM NO.:  [id number]   MEDICAL RECORD NO.:  [id number]                   PATIENT TYPE:  OIB   LOCATION:  2870                                 FACILITY:  [facility]   PHYSICIAN:  Quita Skye. Hart Rochester, M.D.               DATE OF BIRTH:  1928/06/08   DATE OF PROCEDURE:  08/22/2003  DATE OF DISCHARGE:                                 OPERATIVE REPORT   PREOPERATIVE DIAGNOSIS:  End-stage renal disease.   POSTOPERATIVE DIAGNOSIS:  End-stage renal disease.   OPERATION:  Creation of a left forearm radial artery to cephalic vein  arteriovenous fistula (Cimino shunt).   SURGEON:  Quita Skye. Hart Rochester, M.D.   FIRST ASSISTANT:  Carolyn A. Eustaquio Boyden.   ANESTHESIA:  Local.   PROCEDURE:  The patient was taken to the operating room and placed in the  supine position, at which time the left upper extremity was prepped with  Betadine scrub and solution and draped in routine sterile manner.  After  infiltration with 1% Xylocaine with epinephrine, a longitudinal incision was  made just proximal to the wrist midway between the cephalic vein and radial  artery.  The vein was ligated distally and transected and gently dilated  free, ligating its branches with 4-0 silk ties.  It was an excellent vein,  being at least 3 mm in size up to the antecubital area and would dilate  nicely.  The radial artery was exposed beneath the fascia, encircled with  vessel loops, 3000 units of heparin given intravenously.  The artery was  occluded proximally and distally, opened with a 15 blade, extended with the  Potts scissors, would easily accept a 3 mm dilator and had excellent inflow.  The vein was carefully measured and spatulated, anastomosed end-to-side with  6-0 Prolene.  Vessel loops were then released, and there was an excellent  pulse and thrill up to the antecubital area.  No protamine was given.  The  wound was irrigated with saline, closed in layers with Vicryl in a  subcuticular fashion, sterile dressing applied.  The patient taken to the  recovery room in satisfactory condition.                                               [name], M.D.    JDL/MEDQ  D:  08/22/2003  T:  08/22/2003  Job:  [id number]

## 2010-06-06 NOTE — Discharge Summary (Signed)
NAME:  Jackson [name], Jackson [name] NO.:  [id number]   MEDICAL RECORD NO.:  [id number]          PATIENT TYPE:  INP   LOCATION:  3735                         FACILITY:  [facility]   PHYSICIAN:  Barry Dienes. Eloise Harman, M.D.DATE OF BIRTH:  09/01/1928   DATE OF ADMISSION:  02/24/2004  DATE OF DISCHARGE:  02/26/2004                                 DISCHARGE SUMMARY   PERTINENT FINDINGS:  The patient is a 77 year old black male with a history  of hypertension, dilated cardiomyopathy with a left ventricular ejection  fraction of 10%, chronic obstructive pulmonary disease, and valvular heart  disease with moderate mitral regurgitation, chronic renal insufficiency  approaching end-stage kidney disease.  He presented to the [facility]  Emergency Room with increasing cough, congestion, and shortness of breath.  He notes that he recently ran out of some of his medications for congestive  heart failure.  His last hospitalization was basically an outpatient  procedure to allow placement of an A-V fistula in the left upper extremity  in anticipation of upcoming dialysis.  He is closely followed by Dr. Wilber Bihari. [name] of [facility].   Approximately 1 week prior to admission, he developed nasal congestion,  cough with production of thick green sputum.  He denied fever or chills, but  had progressive dyspnea.   MEDICATIONS PRIOR TO ADMISSION:  1.  Lasix 80 mg p.o. b.i.d.  2.  Aspirin 81 mg p.o. daily.  3.  Toprol-XL 25 mg daily.  4.  Aldactone 25 mg once daily.  5.  Norvasc 5 mg twice daily.   INITIAL PHYSICAL EXAM:  VITAL SIGNS:  Temperature 98.7, blood pressure  150/110, pulse 90, respiratory rate 20.  GENERAL:  In general, he was an elderly black male who was in no apparent  distress while on nasal cannula oxygen at 2 L/min.  HEENT:  Exam was within normal limits.  NECK:  Neck was without jugular venous distention or carotid bruit.  CHEST:  Chest had right lower lung field  wheezing and rhonchi.  HEART:  Heart had a regular rate and rhythm with a 3/6 systolic ejection  murmur along the left sternal border.  ABDOMEN:  Abdomen had normal bowel sounds with no hepatosplenomegaly or  tenderness.  EXTREMITIES:  Extremities had no cyanosis, clubbing, or edema.  The left  upper extremity had a palpable fistula.  NEUROLOGIC:  Exam was nonfocal.   LABORATORY STUDIES:  Chest x-ray showed right lower lung airspace disease  superimposed on mild baseline vascular congestion and small effusions.   EKG showed normal sinus rhythm with chronic ST segment and T wave  abnormalities.   White blood cell count was 4.2, hemoglobin 13, hematocrit 40, platelet count  209,000.  Serum sodium was 140, potassium 5.0, chloride 112, CO2 21, BUN 72,  creatinine 4.9, glucose 86, serum albumin 2.6.  BNP greater than 3200.   HOSPITAL COURSE:  The patient was admitted to a medical bed with telemetry.  He showed no signs of arrhythmia during the hospitalization.  He had a renal  ultrasound done which showed bilateral echogenic kidneys with the right  being 10.5 cm and the left being 10.0 cm in length.  There was also a large  prostate gland and small right pleural effusion.  He was seen by a  nephrologist, who submitted labs for urine microscopy, parathyroid hormone  level, and fasting lipids, which were pending at the time of dictation.   PROCEDURES:  1.  Transthoracic echocardiogram.  2.  Renal ultrasound.   COMPLICATIONS:  None.   CONDITION ON DISCHARGE:  His breathing is much improved from the time of  admission.  On room air, he is able to maintain 94% oxygen saturation and  has no dyspnea with walking about his room.  He is eating well and he denies  difficulty with voiding.  Most recent exam shows bilateral mild scattered  wheezing with decreased breath sounds in the right base.  His heart has a  regular rate and rhythm.  S1 and S2 are present.  There is a grade 1/6  systolic  ejection murmur.  Abdomen exam was benign.  Extremities had  bilateral trace leg edema.  Most recent vital signs included a blood  pressure of 134/88, pulse 75, respirations 20, temperature 98.8, pulse  oxygen saturation 94% on room air and his weight is 158.1 pounds.   Most recent laboratory tests include a white blood cell count of 4.9,  hemoglobin 12, hematocrit 38, platelets 199,000; serum sodium 139, potassium  4.6, BUN 74, creatinine 4.7 and BNP continues to be greater than 3200.   DISCHARGE DIAGNOSES:  1.  Congestive heart failure.  2.  Hypertension, essential, uncontrolled.  3.  Dilated cardiomyopathy.  4.  Medication noncompliance.  5.  Near end-stage chronic kidney disease.  6.  Benign prostatic hypertrophy.  7.  Possible right lower lobe pneumonia.  8.  Mitral regurgitation.  9.  Chronic obstructive pulmonary disease.   DISCHARGE MEDICATIONS:  1.  Toprol-XL 25 mg p.o. daily.  2.  Lasix 40 mg 3 tabs p.o. twice daily.  3.  Norvasc 10 mg daily.  4.  Aldactone 25 mg twice daily.  5.  Aspirin 81 mg daily.  6.  Imdur 60 mg daily.  7.  Hydralazine 25 mg 3 times daily.  8.  Avelox 400 mg once daily.  9.  Nephro-Vite 1 tab daily.  10. Albuterol metered-dose inhaler 2 puffs 3 times daily as needed for      wheezing.   DIETARY RECOMMENDATIONS:  He was advised not to add salt to his food.   SPECIAL INSTRUCTIONS:  He was advised not to use drugs such as Motrin or  Aleve.  He was also advised to check his body weights once daily and call  our office if his weight increases by 3 pounds from today's weight.   FOLLOWUP PLANS:  He was advised to have a followup appointment with Dr.  Barry Dienes. [place] at [facility] within 1 week and was  advised to call [phone] to schedule that appointment.  He was also advised  to schedule a followup appointment with Dr. Caryn Section or Dr. Garnetta Buddy at  [facility] and was advised to call telephone number [phone].   Prior to discharge, he will be seen by a Child psychotherapist to see if any  options are available to obtain free or reduced-cost medications.  He was  also advised to stop by our office to obtain samples of Avelox for his  possible pneumonia.      DGP/MEDQ  D:  02/26/2004  T:  02/26/2004  Job:  [id number]   cc:   Wilber Bihari. Caryn Section, M.D.  [address]  [place]  [place] [address]  Fax: [phone]

## 2010-06-10 ENCOUNTER — Ambulatory Visit (INDEPENDENT_AMBULATORY_CARE_PROVIDER_SITE_OTHER): Payer: Medicare Other | Admitting: Vascular Surgery

## 2010-06-10 ENCOUNTER — Ambulatory Visit
Admission: RE | Admit: 2010-06-10 | Discharge: 2010-06-10 | Disposition: A | Discharge status: Home / Self Care | Payer: Medicare Other | Source: Ambulatory Visit | Attending: Vascular Surgery | Admitting: Vascular Surgery

## 2010-06-10 DIAGNOSIS — I723 Aneurysm of iliac artery: Secondary | ICD-10-CM

## 2010-06-10 MED ORDER — IOHEXOL 350 MG/ML SOLN
80.0000 mL | Freq: Once | INTRAVENOUS | Status: AC | PRN
  Administered 2010-06-10: 80 mL via INTRAVENOUS

## 2010-06-11 NOTE — Assessment & Plan Note (Signed)
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  This procedure was performed in October 2011 with a Gore stent graft used in the right common iliac system following embolization of the right internal iliac artery by Dr. Myra Gianotti.  He has done quite well with no abdominal or inguinal symptoms.  He denies any claudication.  He does have end-stage renal disease, and I created a left upper arm AV fistula in December 2011 which is functioning nicely with no symptoms of steal.  CHRONIC STABLE MEDICAL PROBLEMS: 1. Ischemic cardiomyopathy with an EF of 30%. 2. End-stage renal disease.  Hemodialysis Tuesday, Thursday, Saturday. 3. Hypertension. 4. Hyperlipidemia. 5. Secondary hyperparathyroidism. 6. Anemia of chronic disease. 7. Gout.  HISTORY:  Negative for coronary artery disease, diabetes, and stroke.  SOCIAL HISTORY:  Widowed, has 7 children.  A remote history of tobacco use, quit 35 years ago.  REVIEW OF SYSTEMS:  Denies any chest pain, dyspnea on exertion, PND, orthopnea.  He is generally weak from his chronic illnesses but no specific complaints today in complete review of systems.  PHYSICAL EXAMINATION:  Blood pressure 97/58, heart rate 67, respirations 18.  General:  He is a thin elderly male in no apparent distress, alert and oriented x3.  HEENT:  Normal for age.  EOMs intact.  Lungs:  Clear to auscultation.  No rhonchi or wheezing.  Cardiovascular:  Regular rhythm.  No murmurs.  Carotid pulses 3+.  No audible bruits.  Abdomen: Soft, nontender with no pulsatile mass.  He has 3+ femoral pulses bilaterally with well perfused lower extremities.  Neurologic:  Normal. Skin:  Free of rashes.  Today I ordered a CT angiogram  which I have reviewed by the computer. There is no evidence of an endoleak, and his right common iliac stent graft is in excellent position.  Aneurysm in the right common iliac has decreased from 5.4 to 4.4 in maximum diameter.  I reassured him regarding these findings.  We will repeat a CT angiogram in 6 months for continued follow-up of this stent graft repair.    Jackson [name], M.D. Electronically Signed  JDL/MEDQ  D:  06/10/2010  T:  06/11/2010  Job:  [id number]

## 2010-06-13 ENCOUNTER — Other Ambulatory Visit: Payer: Self-pay | Admitting: Vascular Surgery

## 2010-06-13 DIAGNOSIS — I723 Aneurysm of iliac artery: Secondary | ICD-10-CM

## 2010-09-27 ENCOUNTER — Emergency Department (HOSPITAL_COMMUNITY)
Admission: EM | Admit: 2010-09-27 | Discharge: 2010-09-27 | Disposition: A | Discharge status: Home / Self Care | Payer: Medicare Other | Attending: Emergency Medicine | Admitting: Emergency Medicine

## 2010-09-27 DIAGNOSIS — R55 Syncope and collapse: Secondary | ICD-10-CM | POA: Insufficient documentation

## 2010-09-27 DIAGNOSIS — Z8639 Personal history of other endocrine, nutritional and metabolic disease: Secondary | ICD-10-CM | POA: Insufficient documentation

## 2010-09-27 DIAGNOSIS — J4489 Other specified chronic obstructive pulmonary disease: Secondary | ICD-10-CM | POA: Insufficient documentation

## 2010-09-27 DIAGNOSIS — K219 Gastro-esophageal reflux disease without esophagitis: Secondary | ICD-10-CM | POA: Insufficient documentation

## 2010-09-27 DIAGNOSIS — N189 Chronic kidney disease, unspecified: Secondary | ICD-10-CM | POA: Insufficient documentation

## 2010-09-27 DIAGNOSIS — Z79899 Other long term (current) drug therapy: Secondary | ICD-10-CM | POA: Insufficient documentation

## 2010-09-27 DIAGNOSIS — J449 Chronic obstructive pulmonary disease, unspecified: Secondary | ICD-10-CM | POA: Insufficient documentation

## 2010-09-27 DIAGNOSIS — Z862 Personal history of diseases of the blood and blood-forming organs and certain disorders involving the immune mechanism: Secondary | ICD-10-CM | POA: Insufficient documentation

## 2010-09-27 DIAGNOSIS — I129 Hypertensive chronic kidney disease with stage 1 through stage 4 chronic kidney disease, or unspecified chronic kidney disease: Secondary | ICD-10-CM | POA: Insufficient documentation

## 2010-09-27 DIAGNOSIS — Z7982 Long term (current) use of aspirin: Secondary | ICD-10-CM | POA: Insufficient documentation

## 2010-09-27 DIAGNOSIS — Z992 Dependence on renal dialysis: Secondary | ICD-10-CM | POA: Insufficient documentation

## 2010-09-27 LAB — POCT I-STAT, CHEM 8
BUN: 45 mg/dL — ABNORMAL HIGH (ref 6–23)
Calcium, Ion: 1.02 mmol/L — ABNORMAL LOW (ref 1.12–1.32)
Chloride: 94 mEq/L — ABNORMAL LOW (ref 96–112)
Glucose, Bld: 102 mg/dL — ABNORMAL HIGH (ref 70–99)
HCT: 35 % — ABNORMAL LOW (ref 39.0–52.0)
Potassium: 4.3 mEq/L (ref 3.5–5.1)

## 2010-09-27 LAB — OCCULT BLOOD, POC DEVICE: Fecal Occult Bld: NEGATIVE

## 2010-10-03 ENCOUNTER — Encounter: Payer: Self-pay | Admitting: *Deleted

## 2010-10-03 ENCOUNTER — Encounter: Payer: Self-pay | Admitting: Cardiovascular Disease

## 2010-10-04 IMAGING — CT CT CHEST W/O CM
3 series · 17 of 30 positions shown, 19 images · non-contrast
Comparison: Recent chest radiographs, the most recent obtained
earlier today.

CLINICAL DATA: Status post cardiac arrest.  Clinical concern for
right lung nodules.

CT CHEST WITHOUT CONTRAST
TECHNIQUE: Multidetector CT imaging of the chest was performed
following the standard protocol without IV contrast.

[Series 2: routine chest · axial · 0.79mm/px · z∈[-328,-98]mm · 6 of 70 slices shown, 8 images]
[im 12/70  mediastinal]
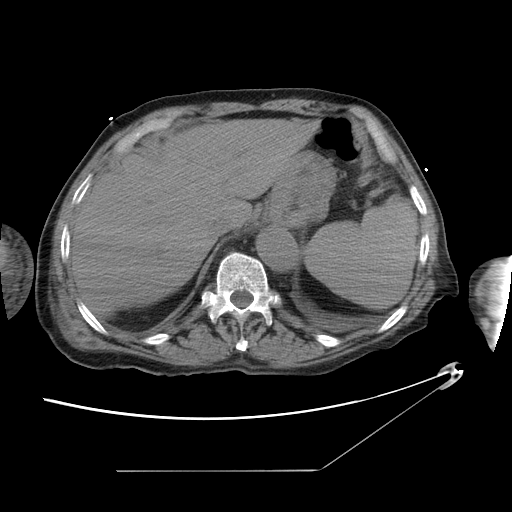
[im 12/70  lung]
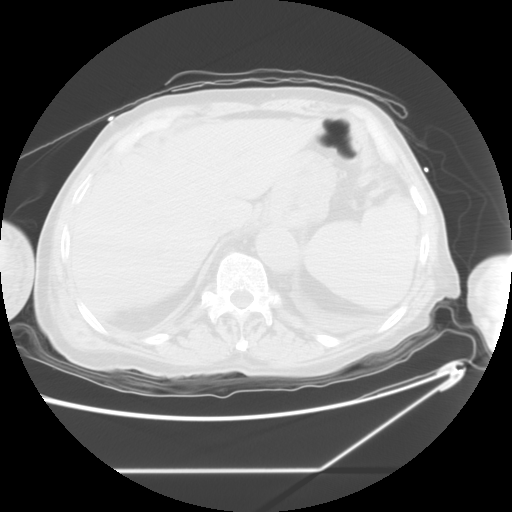
[im 24/70  lung]
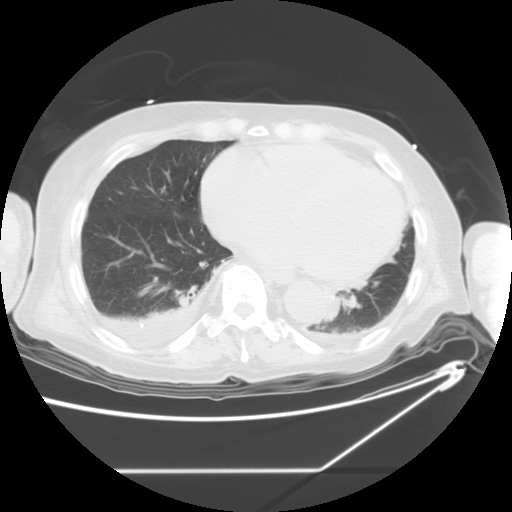
[im 35/70  lung]
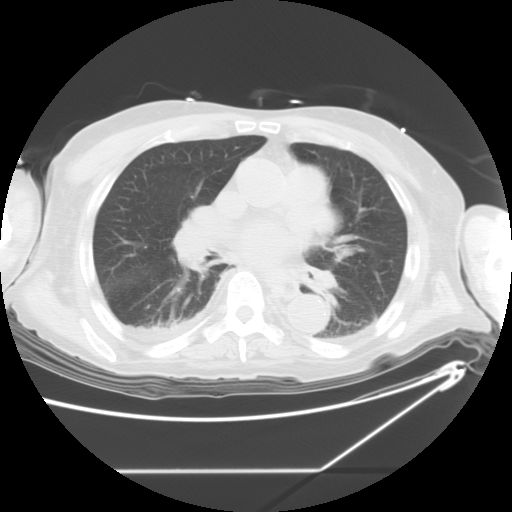
[im 37/70  lung]
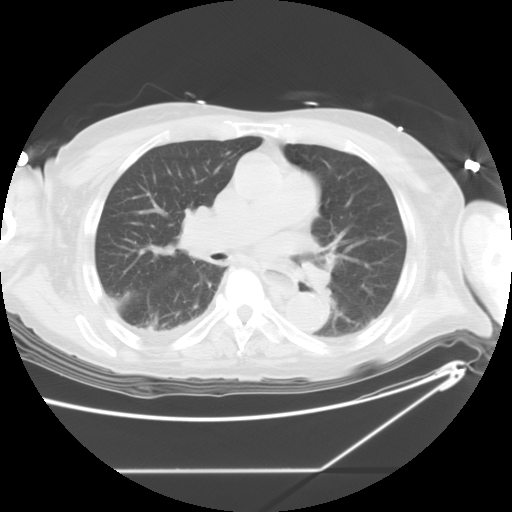
[im 47/70  mediastinal]
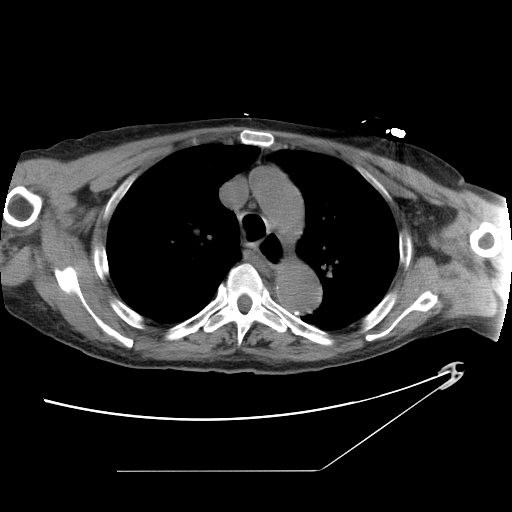
[im 47/70  lung]
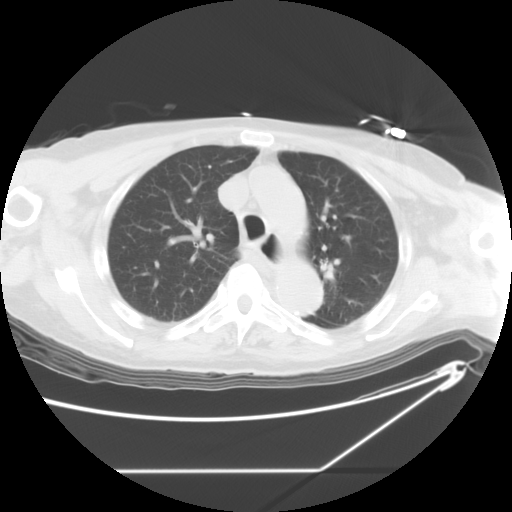
[im 58/70  lung]
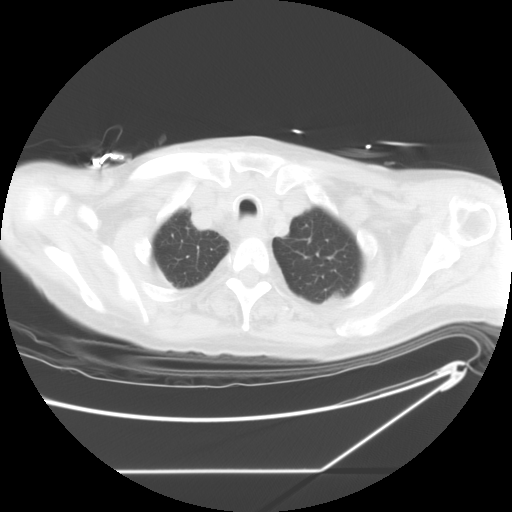

[Series 400: reformatted · sagittal · 0.79mm/px · 8 of 120 slices shown (1 of 2)]
[im 11/120  lung]
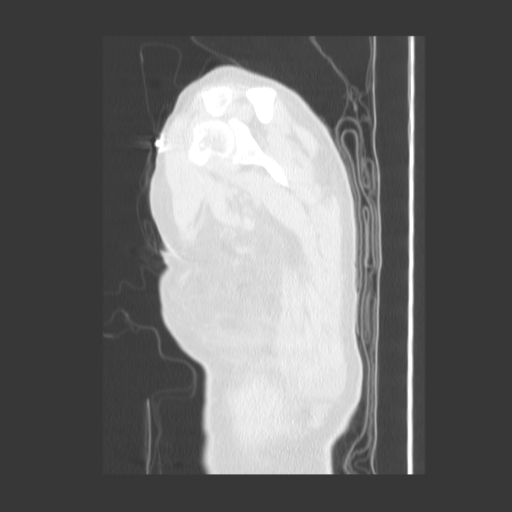
[im 33/120  lung]
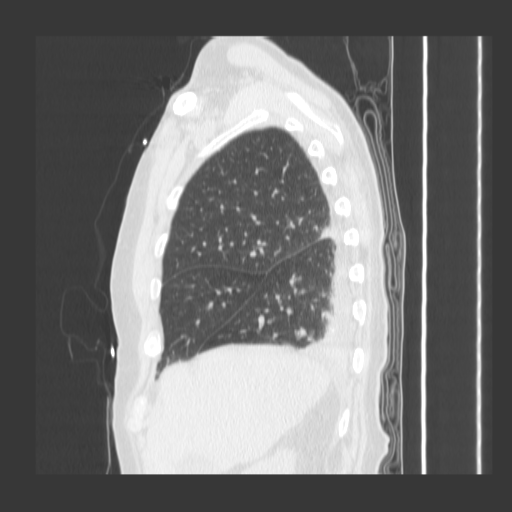
[im 44/120  lung]
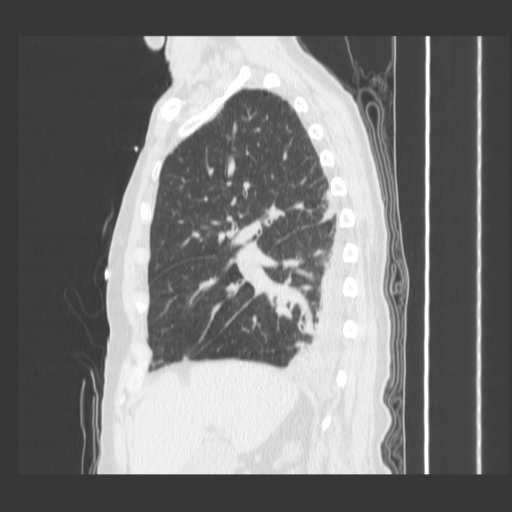
[im 55/120  lung]
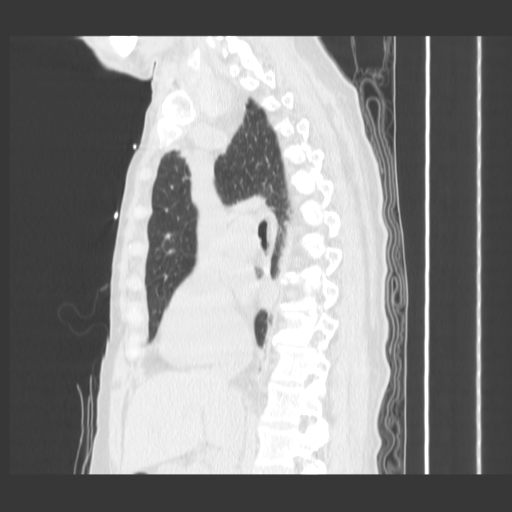
[im 65/120  lung]
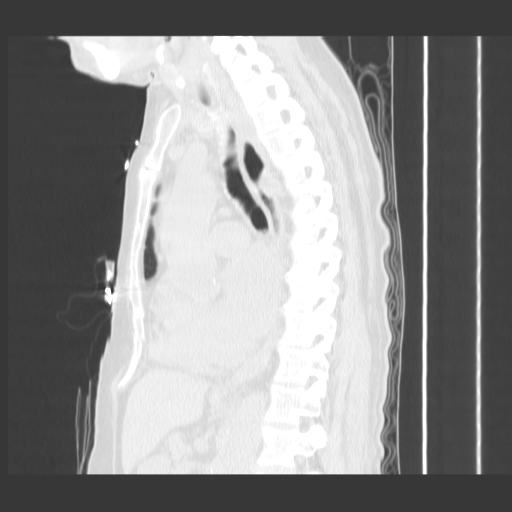
[im 76/120  lung]
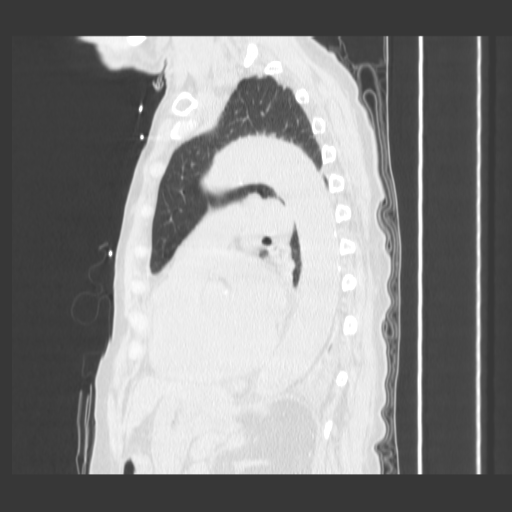
[im 87/120  lung]
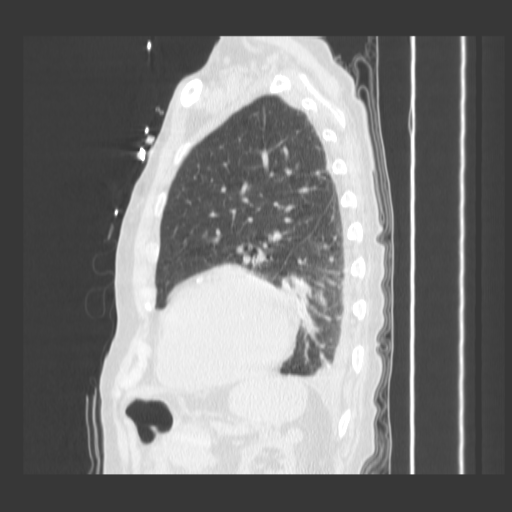
[im 109/120  lung]
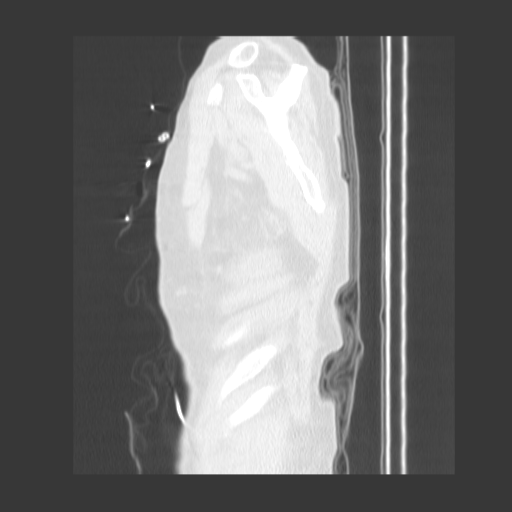

[Series 401: reformatted · coronal · 0.79mm/px · 3 of 83 slices shown (2 of 2)]
[im 11/83  lung]
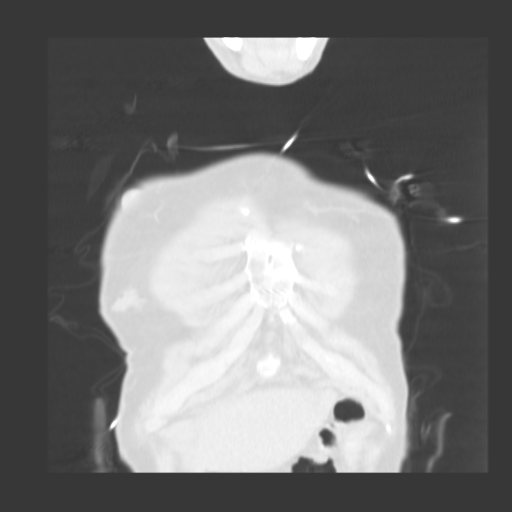
[im 21/83  lung]
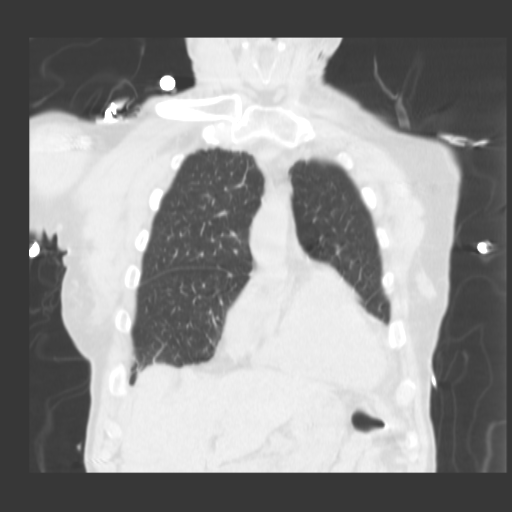
[im 31/83  lung]
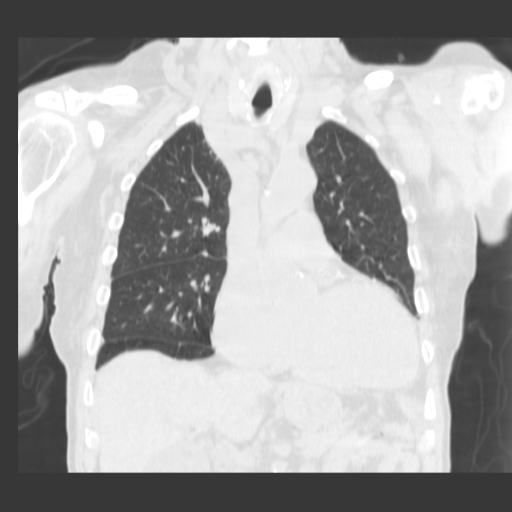

[17 of 30 positions shown; findings below may reference images not displayed]

FINDINGS: Small bilateral pleural effusions.  Adjacent bilateral
lower lobe atelectasis.  Mildly enlarged aortic arch, measuring
measuring 3.7 cm in maximum transverse diameter.  The ascending
aorta measures 3.4 cm in maximum diameter and the descending aorta
measures 2.9 cm in maximum diameter.  Enlarged heart.  Mild changes
of COPD and chronic bronchitis.  No lung masses or enlarged lymph
nodes.  3.1 cm simple appearing upper pole right renal cyst.
cm exophytic, rounded medium density mass arising from the upper
pole of the left kidney.  This measures 36 HU in density on image
number 64.  Diffuse bilateral renal parenchymal thinning.  Mild
thoracic spine degenerative changes.
IMPRESSION: 1.  No lung nodules demonstrated.
2.  Mild changes of COPD and chronic bronchitis.
3.  Small bilateral pleural effusions.
4.  Mild bilateral lower lobe atelectasis.
5.  Mild aneurysmal dilatation of the mid and distal aortic arch.
6.  Cardiomegaly.
7.  1.3 cm exophytic mass arising from the upper pole of the left
kidney.  Differential considerations include a complex cyst and
solid mass.  Ultrasound may provide useful additional information.
If the ultrasound is inconclusive, this could be further evaluated
with pre and postcontrast magnetic resonance imaging.
8.  Bilateral renal atrophy.

## 2010-10-06 ENCOUNTER — Encounter: Payer: Self-pay | Admitting: Cardiovascular Disease

## 2010-10-06 ENCOUNTER — Ambulatory Visit (INDEPENDENT_AMBULATORY_CARE_PROVIDER_SITE_OTHER): Payer: Medicare Other | Admitting: Cardiovascular Disease

## 2010-10-06 VITALS — BP 134/73 | HR 66 | Resp 14 | Wt 149.0 lb

## 2010-10-06 DIAGNOSIS — R55 Syncope and collapse: Secondary | ICD-10-CM

## 2010-10-06 DIAGNOSIS — I5022 Chronic systolic (congestive) heart failure: Secondary | ICD-10-CM

## 2010-10-06 NOTE — Patient Instructions (Signed)
Your physician wants you to follow-up in: 6 months. You will receive a reminder letter in the mail two months in advance. If you don't receive a letter, please call our office to schedule the follow-up appointment.  Your physician has recommended you make the following change in your medication: Stop isosorbide mononitrate.

## 2010-10-06 NOTE — Assessment & Plan Note (Signed)
The patient does not seem to have any symptomatic changes other than this recent episode of syncope or near syncope. The patient does not recall the specifics of this event but he did not fall to the ground or hurt himself. I think the most likely etiology of this is hemodynamic related in a patient on chronic hemodialysis. I have recommended the patient discontinue isosorbide as this may be exacerbating hypotension. The mainstay of therapy for his heart failure treatment will be volume removal and the use of a beta blocker. Will continue his current medical therapy.

## 2010-10-06 NOTE — Progress Notes (Signed)
HPI:  77 year old gentleman presenting for follow up evaluation.  The patient has severe cardiomyopathy with LV ejection fraction less than 30%. He has end-stage renal disease on hemodialysis. He had syncope during dialysis recently and was evaluated in the emergency room. He was observed there for a brief period and then released to continue with his dialysis. They decided not to remove volume at that time. There is no documented arrhythmia. The patient specifically denies chest pain or pressure. He has no lightheadedness with positional changes. He denies edema, orthopnea, or PND. He has no other complaints. He reports compliance with his outpatient medications.  Outpatient Encounter Prescriptions as of 10/06/2010  Medication Sig Dispense Refill  . aspirin 81 MG tablet Take 81 mg by mouth daily.        . B Complex-C-Folic Acid (RENA-VITE PO) Take 1 tablet by mouth daily.        . carvedilol (COREG) 12.5 MG tablet Take 12.5 mg by mouth 2 (two) times daily with a meal.        . finasteride (PROSCAR) 5 MG tablet Take 5 mg by mouth daily.        Marland Kitchen DISCONTD: isosorbide mononitrate (IMDUR) 30 MG 24 hr tablet Take 30 mg by mouth daily.        Marland Kitchen omeprazole (PRILOSEC) 40 MG capsule Take 40 mg by mouth daily.          No Known Allergies  Past Medical History  Diagnosis Date  . Chronic systolic heart failure 09/06/2009  . Rhabdomyolysis   . Renal disease   . Cardiomyopathy   . HTN (hypertension)   . Hyperlipemia   . Anemia   . Gout   . Tricuspid regurgitation   . Thrombus   . Hyperkalemia     ROS: Negative except as per HPI  BP 134/73  Pulse 66  Resp 14  Wt 149 lb (67.586 kg)  PHYSICAL EXAM: Pt is alert and oriented, NAD HEENT: poor dentition (nearly edentulous), otherwise within normal limits Neck: JVP - normal, carotids 2+= without bruits Lungs: CTA bilaterally CV: RRR, LV apex is diffuse, no murmur Abd: soft, NT, Positive BS, no hepatomegaly Ext: no C/C/E, distal pulses intact and  equal Skin: warm/dry no rash  EKG:  Normal sinus rhythm 65 beats per minute, occasional PVC, low voltage QRS, nonspecific T-wave abnormality.  ASSESSMENT AND PLAN:

## 2010-10-06 NOTE — Assessment & Plan Note (Addendum)
Etiology uncertain. The patient is back to his baseline status. I would not recommend extensive workup at this point.  I advised the patient that he should not drive a motor vehicle.

## 2010-10-09 IMAGING — CR DG CHEST 1V PORT
1 series · 1 of 1 positions shown · non-contrast
Comparison: Chest CT 12/12/2007 and chest radiograph 12/12/2007

CLINICAL DATA: Cardiac arrest and aspiration

PORTABLE CHEST - 1 VIEW

[AP]
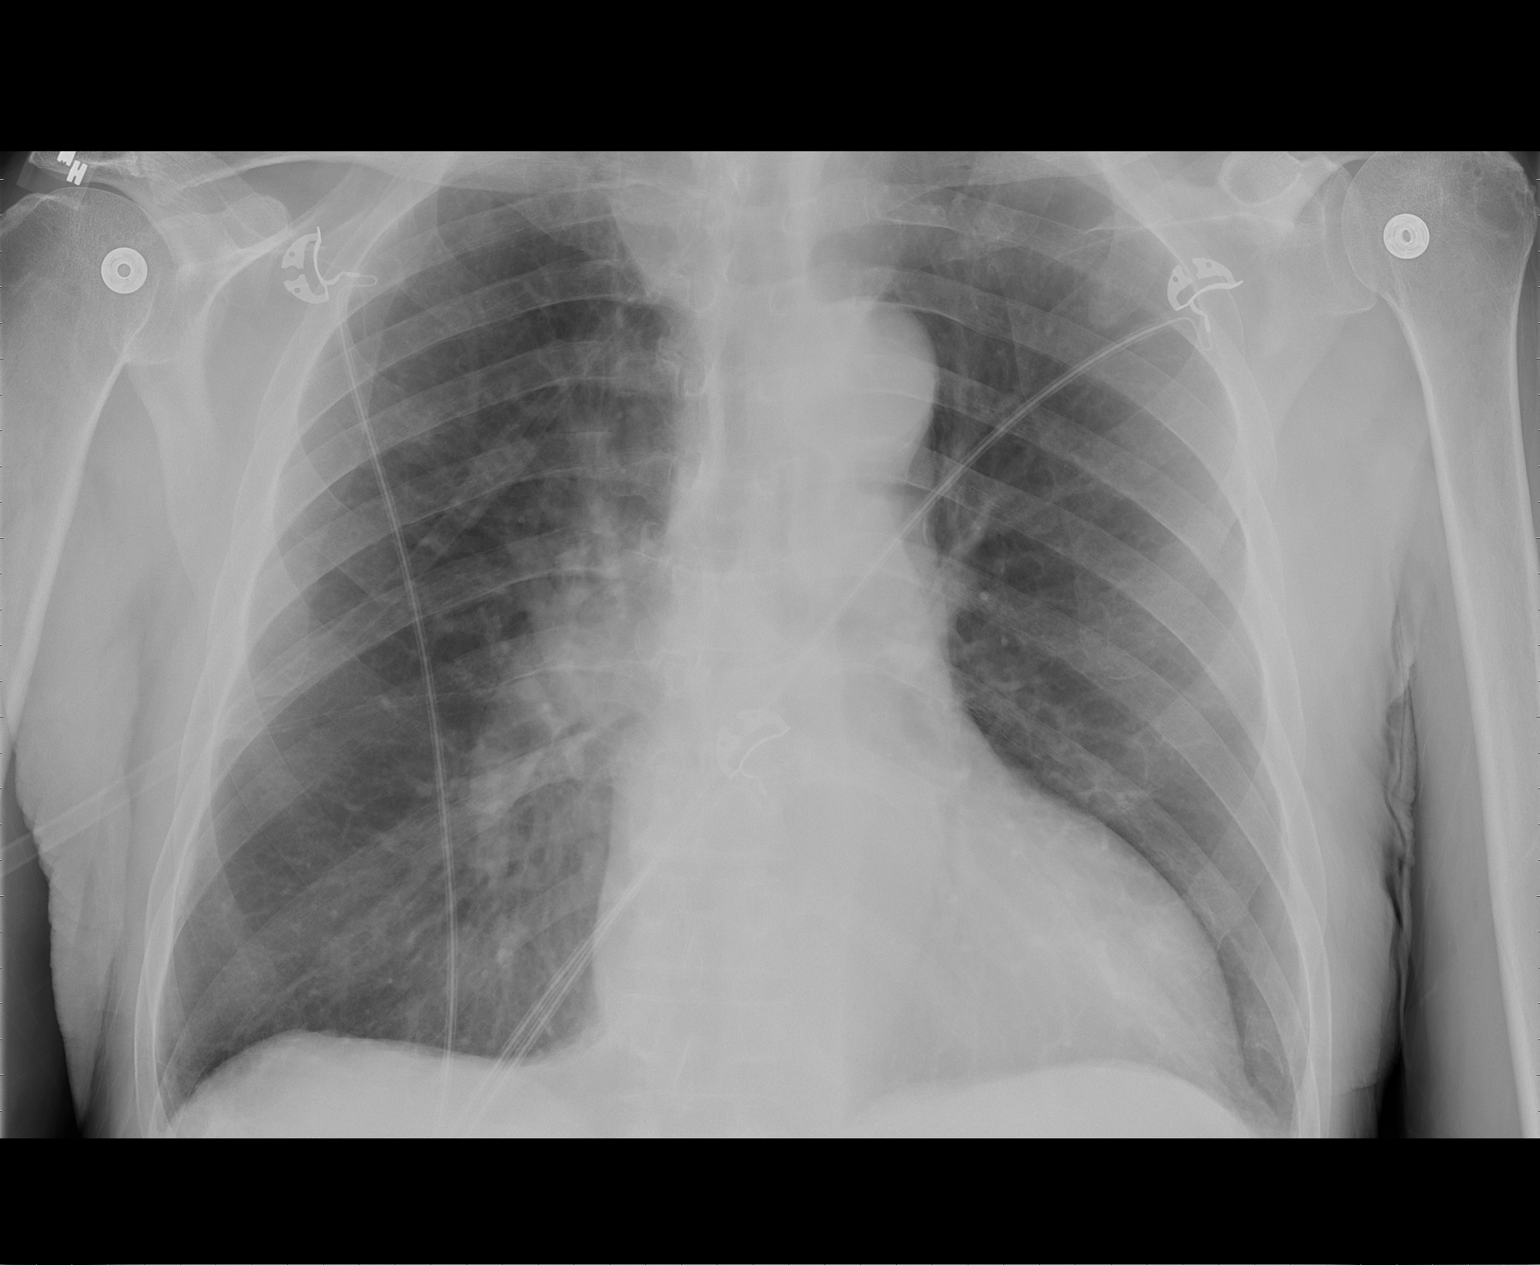

[1 of 1 positions shown; findings below may reference images not displayed]

FINDINGS: There is moderate cardiomegaly.  Mild central pulmonary
vascular congestion without edema.  There is a hazy opacity at the
medial right lung base.  No consolidation or pleural effusion is
identified.
IMPRESSION: 1. Hazy opacity at the medial right lung base.  Early changes
related to aspiration are not excluded.  Alternatively, this could
represent an area of atelectasis.
2.  Cardiomegaly without edema.

## 2010-10-10 IMAGING — CR DG CHEST 2V
2 series · 2 of 2 positions shown · non-contrast
Comparison: the previous day's study

CLINICAL DATA: Cardiac arrest

CHEST - 2 VIEW

[w chest pa]
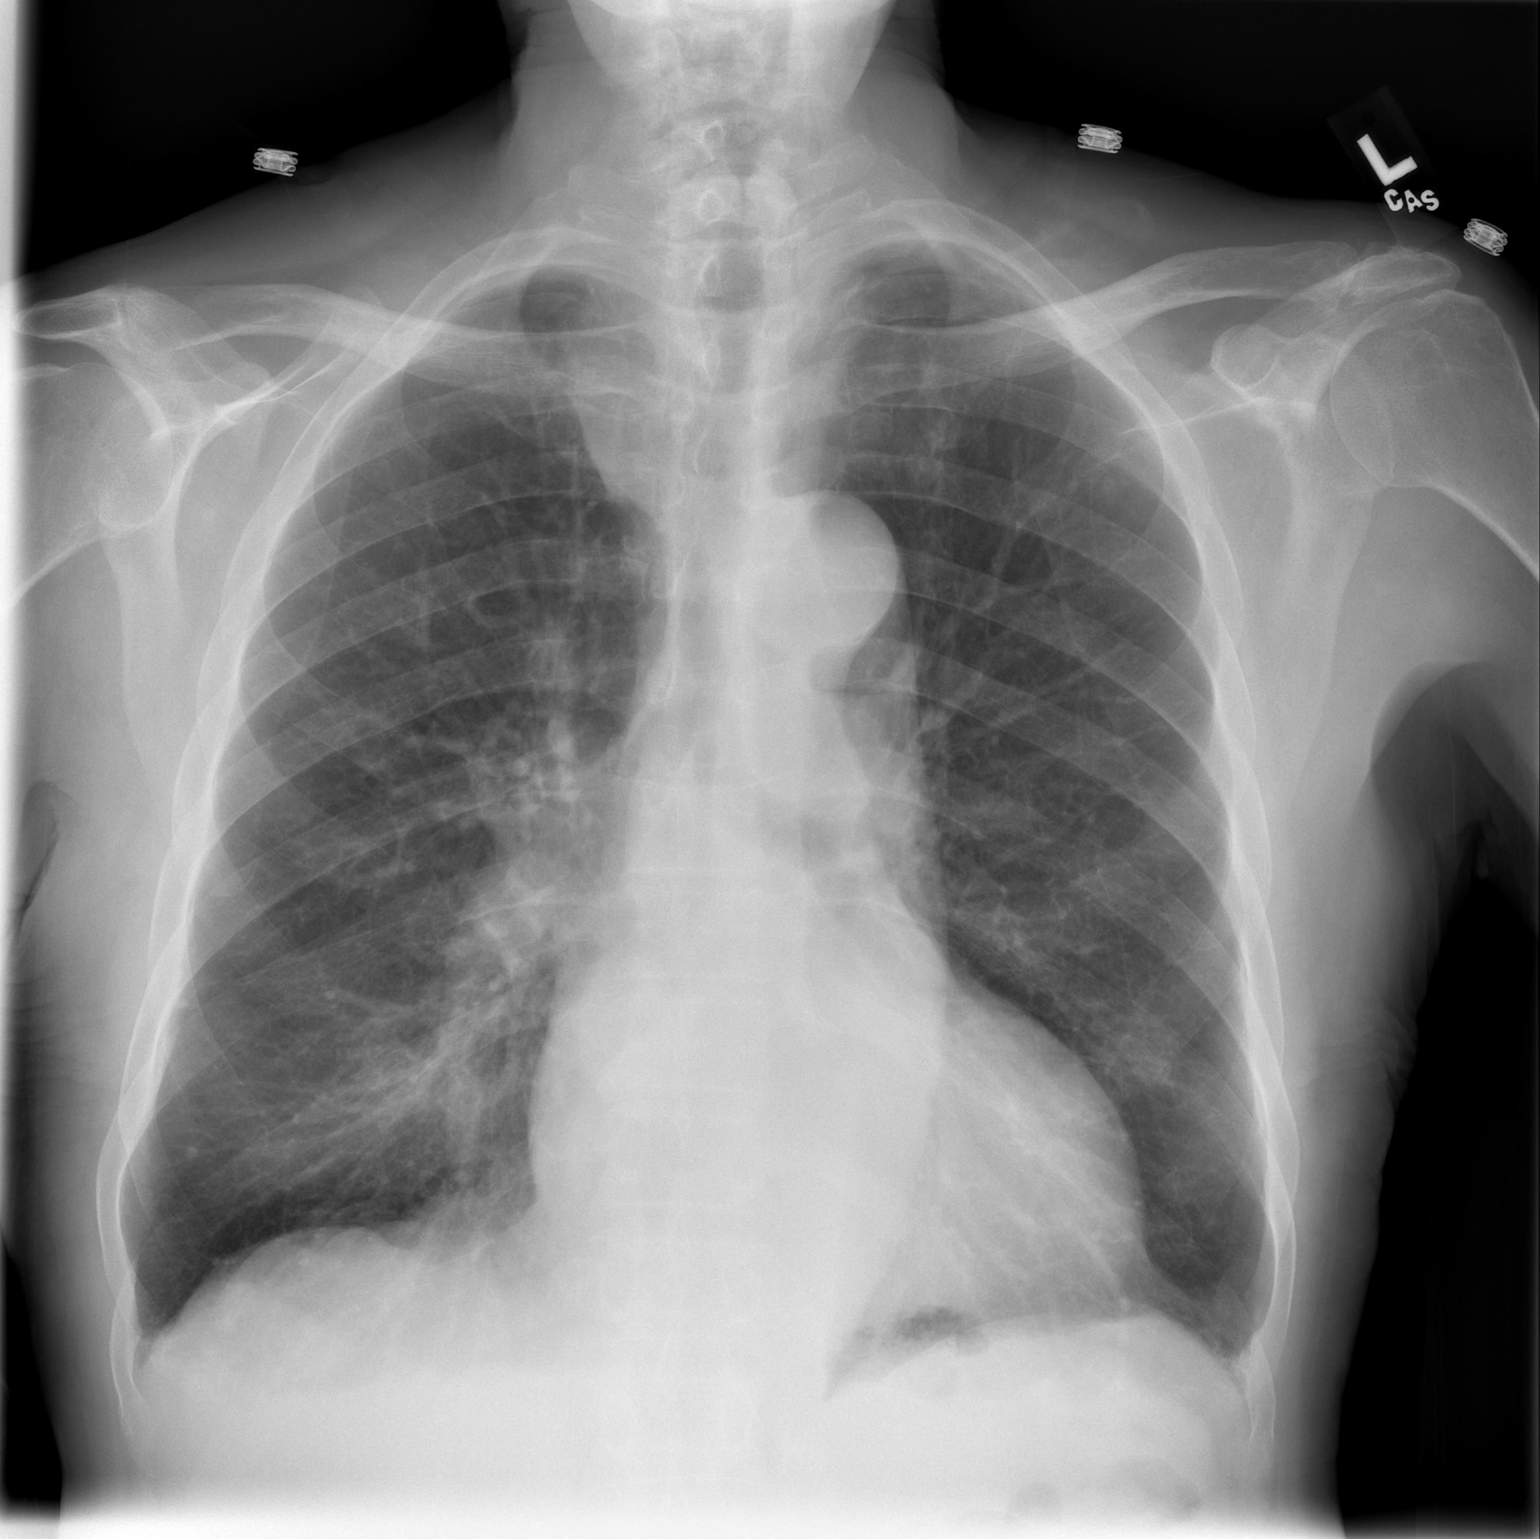

[w chest lat]
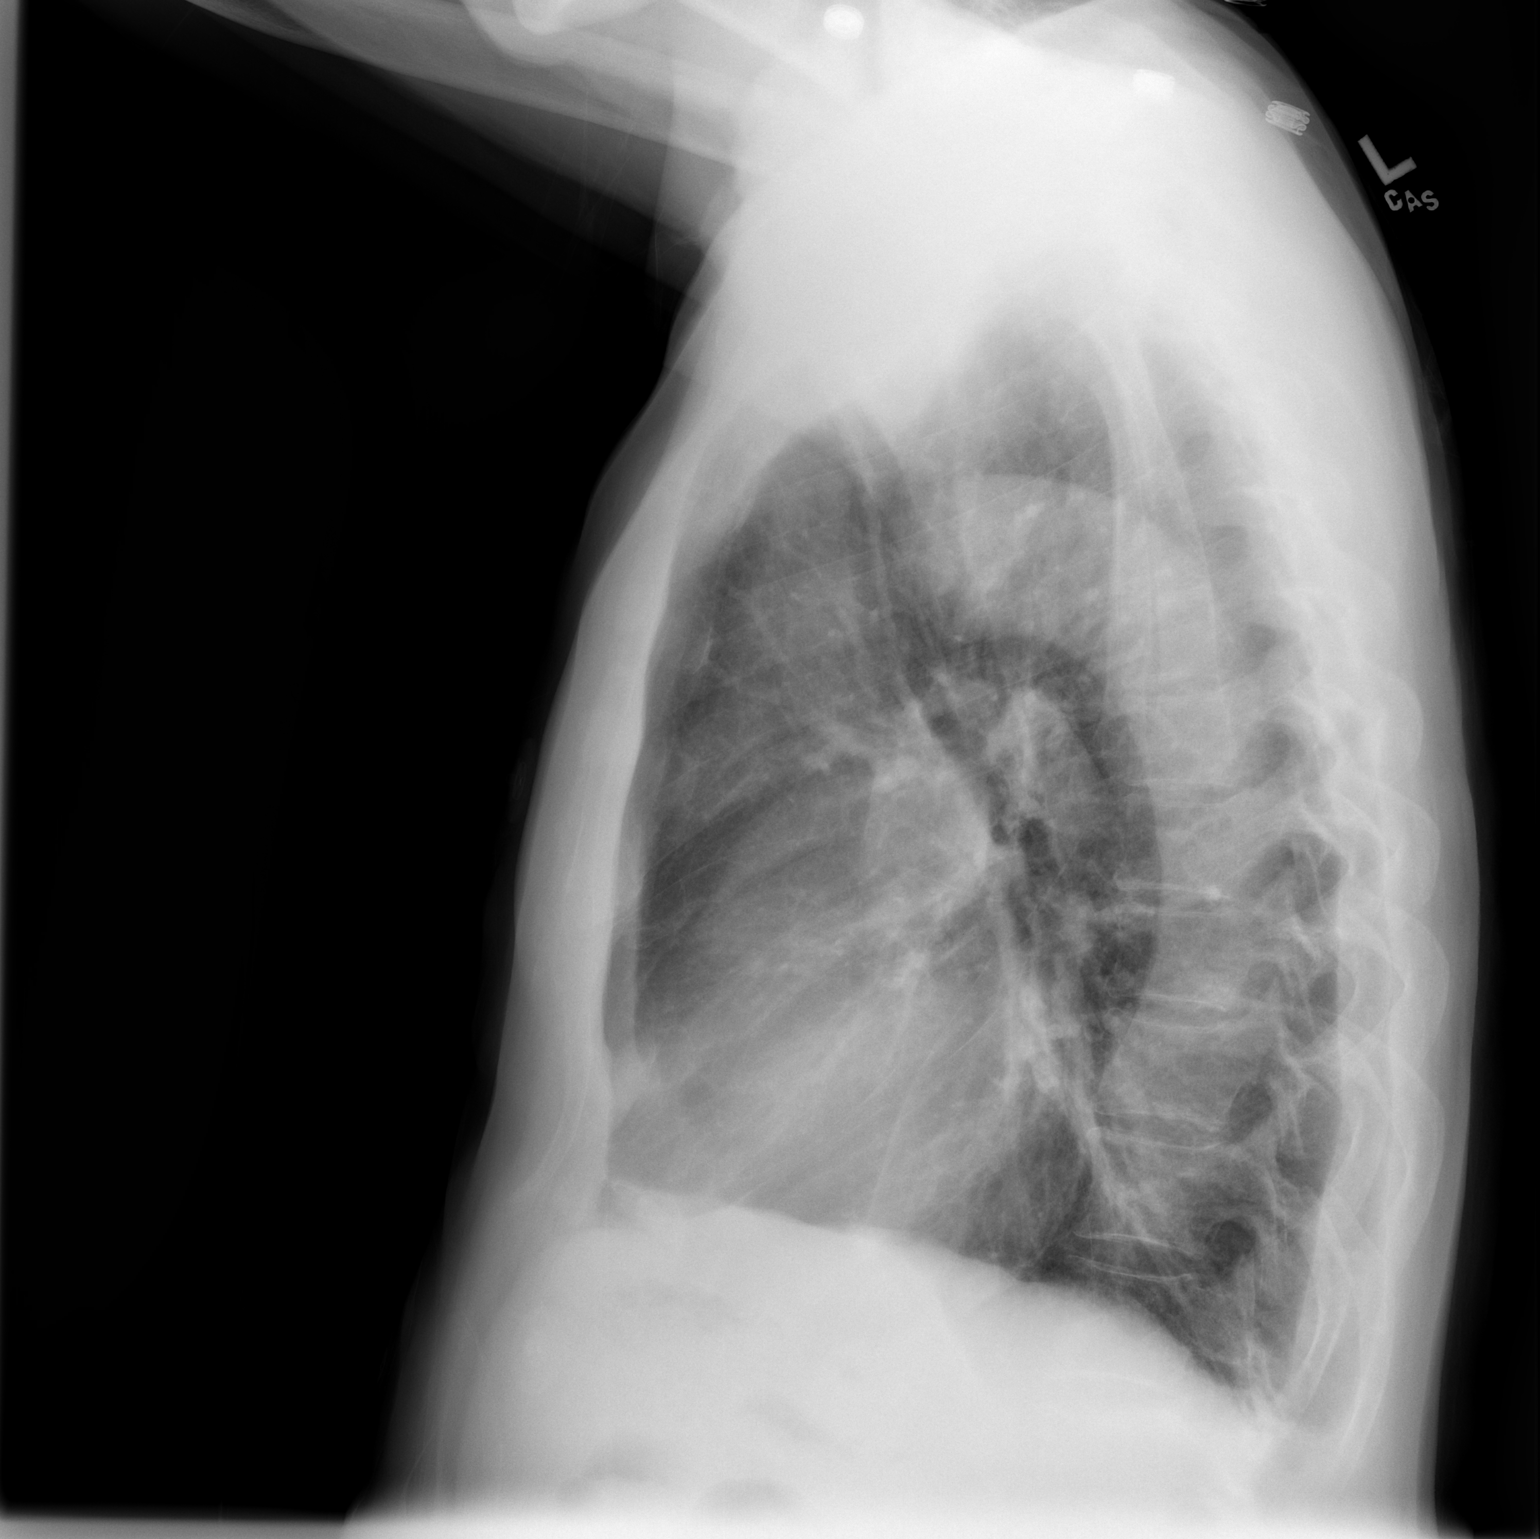

[2 of 2 positions shown; findings below may reference images not displayed]

FINDINGS: Right infrahilar interstitial prominence is stable
compared to films dating back to 01/20/2007.  There is no evidence
of superimposed infiltrate or overt edema.  There is no effusion.
Heart size upper limits normal.  Tortuous thoracic aorta.
IMPRESSION: 1.  Stable chronic interstitial changes without superimposed
abnormality.
2.  Borderline cardiomegaly

## 2010-10-14 LAB — COMPREHENSIVE METABOLIC PANEL
ALT: 9
AST: 8
Alkaline Phosphatase: 98
BUN: 43 — ABNORMAL HIGH
Calcium: 9
GFR calc Af Amer: 11 — ABNORMAL LOW
Glucose, Bld: 89
Potassium: 4.8
Sodium: 136
Total Bilirubin: 0.6
Total Protein: 7.1
Total Protein: 7.5

## 2010-10-14 LAB — CBC
HCT: 42.3
HCT: 44.9
Hemoglobin: 13.8
Hemoglobin: 15.3
MCHC: 32.5
MCV: 90.2
Platelets: 154
RBC: 4.66
RBC: 4.98
RDW: 18.3 — ABNORMAL HIGH
WBC: 5.4
WBC: 6.6

## 2010-10-14 LAB — DIFFERENTIAL
Eosinophils Absolute: 0.2
Eosinophils Relative: 4
Eosinophils Relative: 6 — ABNORMAL HIGH
Lymphocytes Relative: 26
Lymphocytes Relative: 27
Lymphs Abs: 1.4
Lymphs Abs: 1.8
Monocytes Absolute: 0.8
Monocytes Absolute: 0.8
Monocytes Relative: 13 — ABNORMAL HIGH
Monocytes Relative: 15 — ABNORMAL HIGH

## 2010-10-14 LAB — URINE MICROSCOPIC-ADD ON

## 2010-10-14 LAB — HEMOGLOBIN A1C: Hgb A1c MFr Bld: 5.5

## 2010-10-14 LAB — LIPID PANEL
LDL Cholesterol: 90
VLDL: 10

## 2010-10-14 LAB — POCT CARDIAC MARKERS: Troponin i, poc: <0.05

## 2010-10-14 LAB — TSH: TSH: 0.625

## 2010-10-14 LAB — URINALYSIS, ROUTINE W REFLEX MICROSCOPIC
Nitrite: NEGATIVE
Specific Gravity, Urine: 1.012
pH: 8.5 — ABNORMAL HIGH

## 2010-10-14 LAB — CARDIAC PANEL(CRET KIN+CKTOT+MB+TROPI)
CK, MB: 1.3
Total CK: 53
Troponin I: 0.06

## 2010-10-14 LAB — PHOSPHORUS: Phosphorus: 6.1 — ABNORMAL HIGH

## 2010-10-14 LAB — MAGNESIUM: Magnesium: 2.3

## 2010-10-14 LAB — PROTIME-INR: INR: 1

## 2010-10-17 ENCOUNTER — Emergency Department (HOSPITAL_COMMUNITY): Payer: Medicare Other

## 2010-10-17 ENCOUNTER — Emergency Department (HOSPITAL_COMMUNITY)
Admission: EM | Admit: 2010-10-17 | Discharge: 2010-10-17 | Disposition: A | Discharge status: Home / Self Care | Payer: Medicare Other | Attending: Emergency Medicine | Admitting: Emergency Medicine

## 2010-10-17 DIAGNOSIS — R109 Unspecified abdominal pain: Secondary | ICD-10-CM | POA: Insufficient documentation

## 2010-10-17 DIAGNOSIS — J4489 Other specified chronic obstructive pulmonary disease: Secondary | ICD-10-CM | POA: Insufficient documentation

## 2010-10-17 DIAGNOSIS — I428 Other cardiomyopathies: Secondary | ICD-10-CM | POA: Insufficient documentation

## 2010-10-17 DIAGNOSIS — I10 Essential (primary) hypertension: Secondary | ICD-10-CM | POA: Insufficient documentation

## 2010-10-17 DIAGNOSIS — K449 Diaphragmatic hernia without obstruction or gangrene: Secondary | ICD-10-CM | POA: Insufficient documentation

## 2010-10-17 DIAGNOSIS — K219 Gastro-esophageal reflux disease without esophagitis: Secondary | ICD-10-CM | POA: Insufficient documentation

## 2010-10-17 DIAGNOSIS — K5900 Constipation, unspecified: Secondary | ICD-10-CM | POA: Insufficient documentation

## 2010-10-17 DIAGNOSIS — J449 Chronic obstructive pulmonary disease, unspecified: Secondary | ICD-10-CM | POA: Insufficient documentation

## 2010-10-17 DIAGNOSIS — M109 Gout, unspecified: Secondary | ICD-10-CM | POA: Insufficient documentation

## 2010-10-17 LAB — COMPREHENSIVE METABOLIC PANEL
AST: 15 U/L (ref 0–37)
BUN: 19 mg/dL (ref 6–23)
CO2: 30 mEq/L (ref 19–32)
Calcium: 9.9 mg/dL (ref 8.4–10.5)
Creatinine, Ser: 5.75 mg/dL — ABNORMAL HIGH (ref 0.50–1.35)
GFR calc Af Amer: 12 mL/min — ABNORMAL LOW (ref >60)
GFR calc non Af Amer: 10 mL/min — ABNORMAL LOW (ref >60)
Glucose, Bld: 84 mg/dL (ref 70–99)
Total Bilirubin: 0.6 mg/dL (ref 0.3–1.2)

## 2010-10-17 LAB — DIFFERENTIAL
Eosinophils Absolute: 0.1 10*3/uL (ref 0.0–0.7)
Lymphocytes Relative: 20 % (ref 12–46)
Lymphs Abs: 1.3 10*3/uL (ref 0.7–4.0)
Monocytes Relative: 8 % (ref 3–12)
Neutrophils Relative %: 71 % (ref 43–77)

## 2010-10-17 LAB — LIPASE, BLOOD: Lipase: 26 U/L (ref 11–59)

## 2010-10-17 LAB — CBC
HCT: 33.6 % — ABNORMAL LOW (ref 39.0–52.0)
MCH: 31.3 pg (ref 26.0–34.0)
MCV: 94.6 fL (ref 78.0–100.0)
Platelets: 120 10*3/uL — ABNORMAL LOW (ref 150–400)
RBC: 3.55 MIL/uL — ABNORMAL LOW (ref 4.22–5.81)

## 2010-10-20 LAB — RENAL FUNCTION PANEL
Albumin: 3 — ABNORMAL LOW
BUN: 20
CO2: 35 — ABNORMAL HIGH
Chloride: 94 — ABNORMAL LOW
Creatinine, Ser: 5.14 — ABNORMAL HIGH
Glucose, Bld: 113 — ABNORMAL HIGH

## 2010-10-20 LAB — BASIC METABOLIC PANEL
BUN: 60 — ABNORMAL HIGH
CO2: 32
Calcium: 8.6
Calcium: 9.5
Chloride: 92 — ABNORMAL LOW
GFR calc Af Amer: 6 — ABNORMAL LOW
GFR calc non Af Amer: 6 — ABNORMAL LOW
Glucose, Bld: 87
Sodium: 131 — ABNORMAL LOW
Sodium: 136

## 2010-10-20 LAB — CBC
HCT: 40.1
Hemoglobin: 13.4
MCV: 92.9
Platelets: 107 — ABNORMAL LOW
Platelets: 148 — ABNORMAL LOW
RDW: 16.9 — ABNORMAL HIGH
WBC: 3.7 — ABNORMAL LOW

## 2010-10-20 LAB — POCT CARDIAC MARKERS
CKMB, poc: <1 — ABNORMAL LOW
Troponin i, poc: <0.05

## 2010-10-20 LAB — MAGNESIUM: Magnesium: 2.2

## 2010-10-20 LAB — CARDIAC PANEL(CRET KIN+CKTOT+MB+TROPI)
CK, MB: 1.4
Total CK: 56
Troponin I: 0.09 — ABNORMAL HIGH

## 2010-10-20 LAB — POCT I-STAT 4, (NA,K, GLUC, HGB,HCT)
Glucose, Bld: 100 — ABNORMAL HIGH
Potassium: 5.9 — ABNORMAL HIGH

## 2010-10-20 LAB — DIFFERENTIAL
Basophils Absolute: 0
Lymphocytes Relative: 25
Neutro Abs: 2.6

## 2010-10-20 LAB — URINE CULTURE: Colony Count: 40000

## 2010-10-20 LAB — URINALYSIS, ROUTINE W REFLEX MICROSCOPIC
Bilirubin Urine: NEGATIVE
Ketones, ur: NEGATIVE
Nitrite: NEGATIVE
pH: 8.5 — ABNORMAL HIGH

## 2010-10-20 LAB — URINE MICROSCOPIC-ADD ON

## 2010-10-21 LAB — CARDIAC PANEL(CRET KIN+CKTOT+MB+TROPI)
CK, MB: 4.1 — ABNORMAL HIGH
CK, MB: 5.2 — ABNORMAL HIGH
Relative Index: 2.4
Relative Index: 3.7 — ABNORMAL HIGH
Relative Index: INVALID
Total CK: 90
Troponin I: 0.6
Troponin I: 0.63

## 2010-10-21 LAB — POCT I-STAT 3, ART BLOOD GAS (G3+)
Acid-base deficit: 10 — ABNORMAL HIGH
Bicarbonate: 18.8 — ABNORMAL LOW
pH, Arterial: 7.172 — CL
pO2, Arterial: 521 — ABNORMAL HIGH

## 2010-10-21 LAB — CBC
HCT: 33.8 — ABNORMAL LOW
HCT: 36 — ABNORMAL LOW
Hemoglobin: 11.1 — ABNORMAL LOW
Hemoglobin: 11.8 — ABNORMAL LOW
Hemoglobin: 11.8 — ABNORMAL LOW
Hemoglobin: 13.2
Hemoglobin: 13.7
Hemoglobin: 14.7
MCHC: 32.4
MCHC: 32.8
MCV: 91.3
MCV: 92.2
MCV: 92.5
MCV: 92.8
Platelets: 169
RBC: 3.64 — ABNORMAL LOW
RBC: 3.66 — ABNORMAL LOW
RBC: 3.8 — ABNORMAL LOW
RBC: 4.34
RBC: 4.4
RBC: 4.53
RBC: 4.86
RDW: 16.8 — ABNORMAL HIGH
RDW: 17.5 — ABNORMAL HIGH
RDW: 17.5 — ABNORMAL HIGH
WBC: 3.8 — ABNORMAL LOW
WBC: 4.8
WBC: 5
WBC: 5
WBC: 5.1
WBC: 6.1

## 2010-10-21 LAB — CULTURE, BLOOD (ROUTINE X 2): Culture: NO GROWTH

## 2010-10-21 LAB — BASIC METABOLIC PANEL
Calcium: 9.7
Creatinine, Ser: 5.37 — ABNORMAL HIGH
GFR calc Af Amer: 13 — ABNORMAL LOW
GFR calc non Af Amer: 10 — ABNORMAL LOW
Sodium: 136

## 2010-10-21 LAB — COMPREHENSIVE METABOLIC PANEL
ALT: 13
ALT: 16
ALT: 22
ALT: 29
AST: 13
AST: 16
AST: 9
Albumin: 2.4 — ABNORMAL LOW
Albumin: 3.4 — ABNORMAL LOW
Alkaline Phosphatase: 141 — ABNORMAL HIGH
Alkaline Phosphatase: 81
Alkaline Phosphatase: 87
Alkaline Phosphatase: 98
CO2: 24
CO2: 30
Calcium: 7.6 — ABNORMAL LOW
Calcium: 8 — ABNORMAL LOW
Calcium: 9.1
Chloride: 100
GFR calc Af Amer: 11 — ABNORMAL LOW
GFR calc Af Amer: 6 — ABNORMAL LOW
GFR calc Af Amer: 7 — ABNORMAL LOW
GFR calc Af Amer: 9 — ABNORMAL LOW
GFR calc non Af Amer: 9 — ABNORMAL LOW
Glucose, Bld: 106 — ABNORMAL HIGH
Glucose, Bld: 79
Glucose, Bld: 86
Potassium: 4.1
Potassium: 4.9
Potassium: 5.2 — ABNORMAL HIGH
Potassium: >7.5
Sodium: 128 — ABNORMAL LOW
Sodium: 130 — ABNORMAL LOW
Sodium: 133 — ABNORMAL LOW
Sodium: 138
Total Protein: 5.5 — ABNORMAL LOW
Total Protein: 5.9 — ABNORMAL LOW
Total Protein: 6.9

## 2010-10-21 LAB — RENAL FUNCTION PANEL
Albumin: 2.3 — ABNORMAL LOW
Albumin: 2.3 — ABNORMAL LOW
BUN: 41 — ABNORMAL HIGH
BUN: 54 — ABNORMAL HIGH
CO2: 26
Calcium: 7.9 — ABNORMAL LOW
Calcium: 8.3 — ABNORMAL LOW
Calcium: 9.2
Chloride: 101
Chloride: 95 — ABNORMAL LOW
Chloride: 96
Creatinine, Ser: 10.14 — ABNORMAL HIGH
Creatinine, Ser: 8.05 — ABNORMAL HIGH
Creatinine, Ser: 9.13 — ABNORMAL HIGH
GFR calc Af Amer: 6 — ABNORMAL LOW
GFR calc Af Amer: 8 — ABNORMAL LOW
GFR calc non Af Amer: 5 — ABNORMAL LOW
GFR calc non Af Amer: 7 — ABNORMAL LOW
Glucose, Bld: 128 — ABNORMAL HIGH
Glucose, Bld: 89
Phosphorus: 3.6
Phosphorus: 3.7
Phosphorus: 5.7 — ABNORMAL HIGH
Potassium: 4
Potassium: >7.5
Sodium: 129 — ABNORMAL LOW
Sodium: 133 — ABNORMAL LOW
Sodium: 134 — ABNORMAL LOW

## 2010-10-21 LAB — BLOOD GAS, ARTERIAL
Acid-Base Excess: 1.6
Acid-Base Excess: 2
Bicarbonate: 24.6 — ABNORMAL HIGH
Drawn by: 280971
FIO2: 0.4
FIO2: 0.5
MECHVT: 450
O2 Saturation: 100
PEEP: 5
Patient temperature: 100.5
Patient temperature: 98.6
Patient temperature: 98.6
Pressure support: 5
TCO2: 25.6
TCO2: 26.5
pCO2 arterial: 35.1
pCO2 arterial: 37.3
pH, Arterial: 7.467 — ABNORMAL HIGH
pH, Arterial: 7.499 — ABNORMAL HIGH

## 2010-10-21 LAB — APTT
aPTT: 30
aPTT: 38 — ABNORMAL HIGH

## 2010-10-21 LAB — DIFFERENTIAL
Basophils Relative: 0
Eosinophils Absolute: 0.1
Lymphs Abs: 1.5
Monocytes Absolute: 0.3
Monocytes Relative: 7

## 2010-10-21 LAB — GLUCOSE, CAPILLARY
Glucose-Capillary: 100 — ABNORMAL HIGH
Glucose-Capillary: 105 — ABNORMAL HIGH
Glucose-Capillary: 106 — ABNORMAL HIGH
Glucose-Capillary: 106 — ABNORMAL HIGH
Glucose-Capillary: 109 — ABNORMAL HIGH
Glucose-Capillary: 110 — ABNORMAL HIGH
Glucose-Capillary: 115 — ABNORMAL HIGH
Glucose-Capillary: 117 — ABNORMAL HIGH
Glucose-Capillary: 122 — ABNORMAL HIGH
Glucose-Capillary: 130 — ABNORMAL HIGH
Glucose-Capillary: 140 — ABNORMAL HIGH
Glucose-Capillary: 171 — ABNORMAL HIGH
Glucose-Capillary: 30 — CL
Glucose-Capillary: 81
Glucose-Capillary: 85
Glucose-Capillary: 95
Glucose-Capillary: 95
Glucose-Capillary: 96
Glucose-Capillary: 97

## 2010-10-21 LAB — PSA: PSA: 59.48 — ABNORMAL HIGH

## 2010-10-21 LAB — CK TOTAL AND CKMB (NOT AT ARMC)
CK, MB: 4.1 — ABNORMAL HIGH
Relative Index: INVALID
Total CK: 88

## 2010-10-21 LAB — URINE CULTURE: Colony Count: NO GROWTH

## 2010-10-21 LAB — CULTURE, BAL-QUANTITATIVE W GRAM STAIN: Colony Count: >=100000

## 2010-10-21 LAB — PROTIME-INR
INR: 1.2
INR: 1.2
INR: 1.3
Prothrombin Time: 15.9 — ABNORMAL HIGH
Prothrombin Time: 16.8 — ABNORMAL HIGH

## 2010-10-21 LAB — PHOSPHORUS: Phosphorus: 3.7

## 2010-10-23 LAB — COMPREHENSIVE METABOLIC PANEL
ALT: 8 U/L (ref 0–53)
Albumin: 2.8 g/dL — ABNORMAL LOW (ref 3.5–5.2)
Alkaline Phosphatase: 136 U/L — ABNORMAL HIGH (ref 39–117)
BUN: 13 mg/dL (ref 6–23)
Chloride: 98 mEq/L (ref 96–112)
Glucose, Bld: 96 mg/dL (ref 70–99)
Potassium: 4.2 mEq/L (ref 3.5–5.1)
Total Bilirubin: 0.7 mg/dL (ref 0.3–1.2)

## 2010-10-23 LAB — RENAL FUNCTION PANEL
BUN: 18 mg/dL (ref 6–23)
CO2: 32 mEq/L (ref 19–32)
Calcium: 8.9 mg/dL (ref 8.4–10.5)
Creatinine, Ser: 5.29 mg/dL — ABNORMAL HIGH (ref 0.4–1.5)
Glucose, Bld: 55 mg/dL — ABNORMAL LOW (ref 70–99)
Phosphorus: 3.1 mg/dL (ref 2.3–4.6)
Sodium: 136 mEq/L (ref 135–145)

## 2010-10-23 LAB — POCT CARDIAC MARKERS
CKMB, poc: 1.2 ng/mL (ref 1.0–8.0)
CKMB, poc: <1 ng/mL — ABNORMAL LOW (ref 1.0–8.0)
Myoglobin, poc: 466 ng/mL (ref 12–200)

## 2010-10-23 LAB — CARDIAC PANEL(CRET KIN+CKTOT+MB+TROPI)
CK, MB: 0.9 ng/mL (ref 0.3–4.0)
CK, MB: 1.1 ng/mL (ref 0.3–4.0)
Relative Index: INVALID (ref 0.0–2.5)
Total CK: 30 U/L (ref 7–232)
Troponin I: 0.05 ng/mL (ref 0.00–0.06)

## 2010-10-23 LAB — CBC
HCT: 39.6 % (ref 39.0–52.0)
Hemoglobin: 12.6 g/dL — ABNORMAL LOW (ref 13.0–17.0)
Platelets: 100 10*3/uL — ABNORMAL LOW (ref 150–400)
RBC: 3.88 MIL/uL — ABNORMAL LOW (ref 4.22–5.81)
WBC: 4.5 10*3/uL (ref 4.0–10.5)
WBC: 5.6 10*3/uL (ref 4.0–10.5)

## 2010-10-23 LAB — BASIC METABOLIC PANEL
BUN: 36 mg/dL — ABNORMAL HIGH (ref 6–23)
Calcium: 8.5 mg/dL (ref 8.4–10.5)
Creatinine, Ser: 7.72 mg/dL — ABNORMAL HIGH (ref 0.4–1.5)
GFR calc Af Amer: 8 mL/min — ABNORMAL LOW (ref >60)

## 2010-10-23 LAB — CK TOTAL AND CKMB (NOT AT ARMC): CK, MB: 1.1 ng/mL (ref 0.3–4.0)

## 2010-10-23 LAB — DIFFERENTIAL
Basophils Absolute: 0 10*3/uL (ref 0.0–0.1)
Basophils Relative: 1 % (ref 0–1)
Eosinophils Absolute: 0.3 10*3/uL (ref 0.0–0.7)
Monocytes Absolute: 0.7 10*3/uL (ref 0.1–1.0)
Neutro Abs: 3.1 10*3/uL (ref 1.7–7.7)
Neutrophils Relative %: 55 % (ref 43–77)

## 2010-10-23 LAB — TROPONIN I: Troponin I: 0.07 ng/mL — ABNORMAL HIGH (ref 0.00–0.06)

## 2010-10-30 ENCOUNTER — Encounter: Payer: Self-pay | Admitting: Vascular Surgery

## 2010-11-11 ENCOUNTER — Other Ambulatory Visit (HOSPITAL_COMMUNITY): Payer: Self-pay | Admitting: Nephrology

## 2010-11-11 DIAGNOSIS — N186 End stage renal disease: Secondary | ICD-10-CM

## 2010-11-12 ENCOUNTER — Ambulatory Visit (HOSPITAL_COMMUNITY)
Admission: RE | Admit: 2010-11-12 | Discharge: 2010-11-12 | Disposition: A | Discharge status: Home / Self Care | Payer: Medicare Other | Source: Ambulatory Visit | Attending: Nephrology | Admitting: Nephrology

## 2010-11-12 ENCOUNTER — Other Ambulatory Visit (HOSPITAL_COMMUNITY): Payer: Self-pay | Admitting: Nephrology

## 2010-11-12 DIAGNOSIS — D649 Anemia, unspecified: Secondary | ICD-10-CM | POA: Insufficient documentation

## 2010-11-12 DIAGNOSIS — T82898A Other specified complication of vascular prosthetic devices, implants and grafts, initial encounter: Secondary | ICD-10-CM | POA: Insufficient documentation

## 2010-11-12 DIAGNOSIS — I5022 Chronic systolic (congestive) heart failure: Secondary | ICD-10-CM | POA: Insufficient documentation

## 2010-11-12 DIAGNOSIS — I120 Hypertensive chronic kidney disease with stage 5 chronic kidney disease or end stage renal disease: Secondary | ICD-10-CM | POA: Insufficient documentation

## 2010-11-12 DIAGNOSIS — N186 End stage renal disease: Secondary | ICD-10-CM

## 2010-11-12 DIAGNOSIS — Y832 Surgical operation with anastomosis, bypass or graft as the cause of abnormal reaction of the patient, or of later complication, without mention of misadventure at the time of the procedure: Secondary | ICD-10-CM | POA: Insufficient documentation

## 2010-11-12 DIAGNOSIS — I509 Heart failure, unspecified: Secondary | ICD-10-CM | POA: Insufficient documentation

## 2010-11-12 DIAGNOSIS — J449 Chronic obstructive pulmonary disease, unspecified: Secondary | ICD-10-CM | POA: Insufficient documentation

## 2010-11-12 DIAGNOSIS — M109 Gout, unspecified: Secondary | ICD-10-CM | POA: Insufficient documentation

## 2010-11-12 DIAGNOSIS — J4489 Other specified chronic obstructive pulmonary disease: Secondary | ICD-10-CM | POA: Insufficient documentation

## 2010-11-12 MED ORDER — IOHEXOL 300 MG/ML  SOLN
60.0000 mL | Freq: Once | INTRAMUSCULAR | Status: AC | PRN
Start: 2010-11-12 — End: 2010-11-12

## 2010-11-13 ENCOUNTER — Ambulatory Visit (HOSPITAL_COMMUNITY): Payer: Medicare Other

## 2010-11-13 ENCOUNTER — Ambulatory Visit (HOSPITAL_COMMUNITY)
Admission: RE | Admit: 2010-11-13 | Discharge: 2010-11-13 | Disposition: A | Discharge status: Home / Self Care | Payer: Medicare Other | Source: Ambulatory Visit | Attending: Vascular Surgery | Admitting: Vascular Surgery

## 2010-11-13 DIAGNOSIS — I120 Hypertensive chronic kidney disease with stage 5 chronic kidney disease or end stage renal disease: Secondary | ICD-10-CM | POA: Insufficient documentation

## 2010-11-13 DIAGNOSIS — I509 Heart failure, unspecified: Secondary | ICD-10-CM | POA: Insufficient documentation

## 2010-11-13 DIAGNOSIS — J449 Chronic obstructive pulmonary disease, unspecified: Secondary | ICD-10-CM | POA: Insufficient documentation

## 2010-11-13 DIAGNOSIS — Z01818 Encounter for other preprocedural examination: Secondary | ICD-10-CM | POA: Insufficient documentation

## 2010-11-13 DIAGNOSIS — Y832 Surgical operation with anastomosis, bypass or graft as the cause of abnormal reaction of the patient, or of later complication, without mention of misadventure at the time of the procedure: Secondary | ICD-10-CM | POA: Insufficient documentation

## 2010-11-13 DIAGNOSIS — K219 Gastro-esophageal reflux disease without esophagitis: Secondary | ICD-10-CM | POA: Insufficient documentation

## 2010-11-13 DIAGNOSIS — T82898A Other specified complication of vascular prosthetic devices, implants and grafts, initial encounter: Secondary | ICD-10-CM | POA: Insufficient documentation

## 2010-11-13 DIAGNOSIS — E039 Hypothyroidism, unspecified: Secondary | ICD-10-CM | POA: Insufficient documentation

## 2010-11-13 DIAGNOSIS — N186 End stage renal disease: Secondary | ICD-10-CM

## 2010-11-13 DIAGNOSIS — J4489 Other specified chronic obstructive pulmonary disease: Secondary | ICD-10-CM | POA: Insufficient documentation

## 2010-11-13 LAB — SURGICAL PCR SCREEN
MRSA, PCR: NEGATIVE
Staphylococcus aureus: NEGATIVE

## 2010-11-13 LAB — POCT I-STAT 4, (NA,K, GLUC, HGB,HCT)
Potassium: 4.7 mEq/L (ref 3.5–5.1)
Sodium: 133 mEq/L — ABNORMAL LOW (ref 135–145)

## 2010-11-17 NOTE — Op Note (Signed)
NAME:  Jackson [name], Jackson [name] NO.:  [id number]  MEDICAL RECORD NO.:  [id number]  LOCATION:  SDSC                         FACILITY:  [facility]  PHYSICIAN:  Fransisco Hertz, MD       DATE OF BIRTH:  February 05, 1928  DATE OF PROCEDURE: DATE OF DISCHARGE:  11/13/2010                              OPERATIVE REPORT   PROCEDURE:  Right internal jugular vein cannulation under ultrasound guidance, right internal jugular vein tunneled dialysis catheter placement.  PREOPERATIVE DIAGNOSIS:  End-stage renal disease with a thrombosed left brachiocephalic arteriovenous fistula.  POSTOPERATIVE DIAGNOSIS:  End-stage renal disease with a thrombosed left brachiocephalic arteriovenous fistula.  SURGEON:  Fransisco Hertz, MD  ANESTHESIA:  Monitored anesthesia care and local.  FINDINGS:  Tips of the catheter in the right atrium.  SPECIMENS:  None.  ESTIMATED BLOOD LOSS:  Minimal.  INDICATIONS:  This is a 77 year old gentleman who presents with a chief complaint of thrombosed left brachiocephalic arteriovenous fistula.  He underwent a percutaneous thrombectomy and stent placement yesterday. Today, he was found during dialysis to be thrombosed, subsequently with a recent attempt at thrombectomy with early failure I do not think this fistula is salvageable at this point.  At this point, I recommend placement of tunneled dialysis catheter and then repeat vein mapping of his right arm to determine what his future access options will be.  The patient is aware of the risks of this procedure include bleeding, infection, possible central venous injury, and possible pneumothorax.  He is aware of these risks and agreed to proceed forward.  DESCRIPTION OF OPERATION:  After full informed written consent was obtained from the patient, he was brought back to the operating room and placed supine upon the operating table.  Prior to induction, he had received IV antibiotics.  He was then prepped and draped in a  standard fashion for a chest or neck tunneled dialysis catheter placement.  He was given some conscious sedation, and I turned my attention to his right neck.  Under ultrasound guidance, I cannulated the right internal jugular vein, placed the wire down into the inferior vena cava.  The wire was clamped to the drapes, then anesthetized the cannulation site,  the exit chest site, and the subcutaneous tunnel.  I then made stab incisions at the chest exit site and the neck cannulation site and dissected from the chest up to the neck with a metal tunneler, and then dilated this tract with a plastic dilator.  I then unclamped the wire, removed the needle off the wire and serially dilated up the skin tract and venotomy with serial dilators.  Then, I placed a dilator sheath under fluoroscopic guidance down into the superior vena cava.  The dilator and wire were removed, and I placed a 23-cm Diatek catheter into the right atrium under fluoroscopic guidance.  The sheath was then broken and peeled away while holding the cuff of the catheter in place. I then connected the back into this catheter to the metal tunneler and passed in retrograde fashion through the subcutaneous tunnel, then pulled the catheter appropriate position leaving the cuff about a centimeter away from the exit.  I then interrogated the position  of the catheter, the 2 tips were in the right atrium, and there was no kinks with this catheter.  The back in this catheter was transected revealing 2 lumens of this catheter.  The 2 ports were docked onto the 2 lumens and the catheter hub was screwed into place.  I then tested each port by aspirating and flushing with no resistance in either port.  Then, I injected heparinized saline into each port.  At this point, the catheter was secured in place with 2 interrupted stitches of 4-0 nylon tied to the catheter.  Then, the neck incision was repaired with a U-stitch of 4-0 Monocryl.  Skin  was cleaned and dried in all locations.  Sterile bandages were applied.  Each port was then loaded with concentrated heparin at 1000 units/mL concentration.  The sterile caps were then applied to each port.  The patient tolerated the procedure well.  COMPLICATIONS:  None.  CONDITION:  Stable.     Fransisco Hertz, MD     BLC/MEDQ  D:  11/13/2010  T:  11/13/2010  Job:  [id number]  Electronically Signed by Leonides Sake MD on 11/17/2010 10:00:53 AM

## 2010-11-27 ENCOUNTER — Encounter: Payer: Self-pay | Admitting: Vascular Surgery

## 2010-12-01 ENCOUNTER — Other Ambulatory Visit (HOSPITAL_COMMUNITY): Payer: Self-pay | Admitting: Nephrology

## 2010-12-01 DIAGNOSIS — N186 End stage renal disease: Secondary | ICD-10-CM

## 2010-12-02 ENCOUNTER — Ambulatory Visit (HOSPITAL_COMMUNITY)
Admission: RE | Admit: 2010-12-02 | Discharge: 2010-12-02 | Disposition: A | Discharge status: Home / Self Care | Payer: Medicare Other | Source: Ambulatory Visit | Attending: Nephrology | Admitting: Nephrology

## 2010-12-02 ENCOUNTER — Other Ambulatory Visit (HOSPITAL_COMMUNITY): Payer: Self-pay | Admitting: Nephrology

## 2010-12-02 DIAGNOSIS — I428 Other cardiomyopathies: Secondary | ICD-10-CM | POA: Insufficient documentation

## 2010-12-02 DIAGNOSIS — I509 Heart failure, unspecified: Secondary | ICD-10-CM | POA: Insufficient documentation

## 2010-12-02 DIAGNOSIS — I120 Hypertensive chronic kidney disease with stage 5 chronic kidney disease or end stage renal disease: Secondary | ICD-10-CM | POA: Insufficient documentation

## 2010-12-02 DIAGNOSIS — T82898A Other specified complication of vascular prosthetic devices, implants and grafts, initial encounter: Secondary | ICD-10-CM | POA: Insufficient documentation

## 2010-12-02 DIAGNOSIS — Z992 Dependence on renal dialysis: Secondary | ICD-10-CM | POA: Insufficient documentation

## 2010-12-02 DIAGNOSIS — Y849 Medical procedure, unspecified as the cause of abnormal reaction of the patient, or of later complication, without mention of misadventure at the time of the procedure: Secondary | ICD-10-CM | POA: Insufficient documentation

## 2010-12-02 DIAGNOSIS — N186 End stage renal disease: Secondary | ICD-10-CM

## 2010-12-02 DIAGNOSIS — I5022 Chronic systolic (congestive) heart failure: Secondary | ICD-10-CM | POA: Insufficient documentation

## 2010-12-02 MED ORDER — GELATIN ABSORBABLE 12-7 MM EX MISC
CUTANEOUS | Status: AC
  Filled 2010-12-02: qty 1

## 2010-12-02 MED ORDER — FENTANYL CITRATE 0.05 MG/ML IJ SOLN
INTRAMUSCULAR | Status: AC | PRN
  Administered 2010-12-02: 50 ug via INTRAVENOUS

## 2010-12-02 MED ORDER — HEPARIN SODIUM (PORCINE) 1000 UNIT/ML IJ SOLN
INTRAMUSCULAR | Status: AC
  Administered 2010-12-02: 3.6 [IU]
  Filled 2010-12-02: qty 1

## 2010-12-02 MED ORDER — MIDAZOLAM HCL 2 MG/2ML IJ SOLN
INTRAMUSCULAR | Status: AC
  Filled 2010-12-02: qty 4

## 2010-12-02 MED ORDER — VANCOMYCIN HCL IN DEXTROSE 1-5 GM/200ML-% IV SOLN
1000.0000 mg | INTRAVENOUS | Status: AC
  Administered 2010-12-02: 1000 mg via INTRAVENOUS
  Filled 2010-12-02: qty 200

## 2010-12-02 MED ORDER — FENTANYL CITRATE 0.05 MG/ML IJ SOLN
INTRAMUSCULAR | Status: AC
  Filled 2010-12-02: qty 4

## 2010-12-02 MED ORDER — MIDAZOLAM HCL 5 MG/5ML IJ SOLN
INTRAMUSCULAR | Status: AC | PRN
  Administered 2010-12-02: 1 mg via INTRAVENOUS

## 2010-12-02 NOTE — Procedures (Signed)
Successful exchange of the Rt IJ tunneled HD cath Tips svc/ra Ready to use No comp

## 2010-12-02 NOTE — ED Notes (Signed)
Patient to be discharged from nursing s

## 2010-12-02 NOTE — ED Notes (Signed)
Patient ok to be discharged from the nursing station home. Will be transported home by his son and disch

## 2010-12-02 NOTE — H&P (Signed)
Jackson [name] is an 77 y.o. male.   Chief Complaint: clotted HD catheter.  Request made for exchange. HPI: HD cath placed in OR on 11/13/10 when AVF was deemed unsalvageable. Became clotted during last HD treatment at the end.    Past Medical History  Diagnosis Date  . Chronic systolic heart failure 09/06/2009  . Rhabdomyolysis   . Renal disease   . Cardiomyopathy   . HTN (hypertension)   . Hyperlipemia   . Anemia   . Gout   . Tricuspid regurgitation   . Thrombus   . Hyperkalemia   . Thyroid disease   . Cancer     prostate  . COPD (chronic obstructive pulmonary disease)   . CHF (congestive heart failure)   . GERD (gastroesophageal reflux disease)     Past Surgical History  Procedure Date  . Hernia repair   . Av fistula placement   . Jugular catheter placement   . 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 Social History:  reports that he has quit smoking. His smoking use included Cigarettes. He quit after 40 years of use. He does not have any smokeless tobacco history on file. He reports that he does not drink alcohol. His drug history not on file.  Allergies:  Allergies  Allergen Reactions  . Fortaz (Ceftazidime) Itching    Medications Prior to Admission  Medication Sig Dispense Refill  . aspirin 81 MG tablet Take 81 mg by mouth daily.        . B Complex-C-Folic Acid (RENA-VITE PO) Take 1 tablet by mouth daily.        . carvedilol (COREG) 12.5 MG tablet Take 12.5 mg by mouth 2 (two) times daily with a meal.        . chlorproMAZINE (THORAZINE) 25 MG tablet Take 25 mg by mouth as needed.        . finasteride (PROSCAR) 5 MG tablet Take 5 mg by mouth daily.        . isosorbide mononitrate (IMDUR) 30 MG 24 hr tablet Take 30 mg by mouth daily.        Marland Kitchen omeprazole (PRILOSEC) 40 MG capsule Take 40 mg by mouth daily.         Medications Prior to Admission  Medication Dose Route Frequency Provider Last Rate Last Dose  .  vancomycin (VANCOCIN) IVPB 1000 mg/200 mL premix  1,000 mg Intravenous On Call Anselm Pancoast, PA         Review of Systems  Constitutional: Negative.   HENT: Negative.   Respiratory: Negative.   Cardiovascular: Negative.   Skin: Negative.   Endo/Heme/Allergies: Negative.      Physical Exam  Constitutional: He is oriented to person, place, and time.  Neck: Normal range of motion.  Cardiovascular: Normal rate and regular rhythm.  Exam reveals no gallop and no friction rub.   No murmur heard. Respiratory: Effort normal and breath sounds normal. He has no rales.  GI: Bowel sounds are normal.  Neurological: He is alert and oriented to person, place, and time.  Skin: Skin is warm and dry.     Assessment/Plan: HD catheter clogged. Needs exchange.  Procedure details for exchange discussed with patient and potential risks including but not limited to infection, bleeding, vessel damage and complications with moderate sedation.  Patient verbalized his understanding of this information and written consent was obtained.  Patient to return to dialysis post cathter exchange  today - has driver.   CAMPBELL,PAMELA D 12/02/2010, 8:25 AM

## 2010-12-09 ENCOUNTER — Ambulatory Visit: Payer: Medicare Other | Admitting: Vascular Surgery

## 2010-12-13 ENCOUNTER — Inpatient Hospital Stay (HOSPITAL_COMMUNITY)
Admission: EM | Admit: 2010-12-13 | Discharge: 2011-01-20 | DRG: 308 | Disposition: E | Payer: Medicare Other | Attending: Internal Medicine | Admitting: Internal Medicine

## 2010-12-13 ENCOUNTER — Inpatient Hospital Stay (HOSPITAL_COMMUNITY): Payer: Medicare Other

## 2010-12-13 ENCOUNTER — Encounter (HOSPITAL_COMMUNITY): Payer: Self-pay | Admitting: Emergency Medicine

## 2010-12-13 ENCOUNTER — Emergency Department (HOSPITAL_COMMUNITY): Payer: Medicare Other

## 2010-12-13 DIAGNOSIS — I4891 Unspecified atrial fibrillation: Secondary | ICD-10-CM | POA: Diagnosis not present

## 2010-12-13 DIAGNOSIS — I469 Cardiac arrest, cause unspecified: Secondary | ICD-10-CM

## 2010-12-13 DIAGNOSIS — Z79899 Other long term (current) drug therapy: Secondary | ICD-10-CM

## 2010-12-13 DIAGNOSIS — N186 End stage renal disease: Secondary | ICD-10-CM | POA: Diagnosis present

## 2010-12-13 DIAGNOSIS — J9600 Acute respiratory failure, unspecified whether with hypoxia or hypercapnia: Secondary | ICD-10-CM | POA: Diagnosis present

## 2010-12-13 DIAGNOSIS — R1312 Dysphagia, oropharyngeal phase: Secondary | ICD-10-CM | POA: Diagnosis not present

## 2010-12-13 DIAGNOSIS — J449 Chronic obstructive pulmonary disease, unspecified: Secondary | ICD-10-CM | POA: Diagnosis not present

## 2010-12-13 DIAGNOSIS — I4901 Ventricular fibrillation: Secondary | ICD-10-CM

## 2010-12-13 DIAGNOSIS — I472 Ventricular tachycardia, unspecified: Secondary | ICD-10-CM | POA: Diagnosis not present

## 2010-12-13 DIAGNOSIS — E162 Hypoglycemia, unspecified: Secondary | ICD-10-CM | POA: Diagnosis not present

## 2010-12-13 DIAGNOSIS — X58XXXA Exposure to other specified factors, initial encounter: Secondary | ICD-10-CM | POA: Diagnosis not present

## 2010-12-13 DIAGNOSIS — S68019A Complete traumatic metacarpophalangeal amputation of unspecified thumb, initial encounter: Secondary | ICD-10-CM

## 2010-12-13 DIAGNOSIS — I120 Hypertensive chronic kidney disease with stage 5 chronic kidney disease or end stage renal disease: Secondary | ICD-10-CM | POA: Diagnosis present

## 2010-12-13 DIAGNOSIS — E785 Hyperlipidemia, unspecified: Secondary | ICD-10-CM | POA: Diagnosis present

## 2010-12-13 DIAGNOSIS — I4729 Other ventricular tachycardia: Secondary | ICD-10-CM | POA: Diagnosis not present

## 2010-12-13 DIAGNOSIS — I5023 Acute on chronic systolic (congestive) heart failure: Secondary | ICD-10-CM | POA: Diagnosis not present

## 2010-12-13 DIAGNOSIS — N2581 Secondary hyperparathyroidism of renal origin: Secondary | ICD-10-CM | POA: Diagnosis present

## 2010-12-13 DIAGNOSIS — R197 Diarrhea, unspecified: Secondary | ICD-10-CM | POA: Diagnosis not present

## 2010-12-13 DIAGNOSIS — Z8546 Personal history of malignant neoplasm of prostate: Secondary | ICD-10-CM

## 2010-12-13 DIAGNOSIS — R570 Cardiogenic shock: Secondary | ICD-10-CM | POA: Diagnosis present

## 2010-12-13 DIAGNOSIS — R509 Fever, unspecified: Secondary | ICD-10-CM

## 2010-12-13 DIAGNOSIS — Z515 Encounter for palliative care: Secondary | ICD-10-CM

## 2010-12-13 DIAGNOSIS — R64 Cachexia: Secondary | ICD-10-CM | POA: Diagnosis not present

## 2010-12-13 DIAGNOSIS — S6990XA Unspecified injury of unspecified wrist, hand and finger(s), initial encounter: Secondary | ICD-10-CM | POA: Diagnosis not present

## 2010-12-13 DIAGNOSIS — K219 Gastro-esophageal reflux disease without esophagitis: Secondary | ICD-10-CM | POA: Diagnosis present

## 2010-12-13 DIAGNOSIS — I079 Rheumatic tricuspid valve disease, unspecified: Secondary | ICD-10-CM | POA: Diagnosis present

## 2010-12-13 DIAGNOSIS — I10 Essential (primary) hypertension: Secondary | ICD-10-CM | POA: Diagnosis present

## 2010-12-13 DIAGNOSIS — I509 Heart failure, unspecified: Secondary | ICD-10-CM | POA: Diagnosis present

## 2010-12-13 DIAGNOSIS — D696 Thrombocytopenia, unspecified: Secondary | ICD-10-CM | POA: Diagnosis present

## 2010-12-13 DIAGNOSIS — D62 Acute posthemorrhagic anemia: Secondary | ICD-10-CM | POA: Diagnosis present

## 2010-12-13 DIAGNOSIS — D649 Anemia, unspecified: Secondary | ICD-10-CM | POA: Diagnosis present

## 2010-12-13 DIAGNOSIS — E876 Hypokalemia: Secondary | ICD-10-CM | POA: Diagnosis present

## 2010-12-13 DIAGNOSIS — J4489 Other specified chronic obstructive pulmonary disease: Secondary | ICD-10-CM | POA: Diagnosis not present

## 2010-12-13 DIAGNOSIS — E873 Alkalosis: Secondary | ICD-10-CM | POA: Diagnosis not present

## 2010-12-13 DIAGNOSIS — R5381 Other malaise: Secondary | ICD-10-CM | POA: Diagnosis not present

## 2010-12-13 DIAGNOSIS — Z66 Do not resuscitate: Secondary | ICD-10-CM | POA: Diagnosis not present

## 2010-12-13 DIAGNOSIS — Z7982 Long term (current) use of aspirin: Secondary | ICD-10-CM

## 2010-12-13 DIAGNOSIS — J690 Pneumonitis due to inhalation of food and vomit: Secondary | ICD-10-CM | POA: Diagnosis not present

## 2010-12-13 DIAGNOSIS — G931 Anoxic brain damage, not elsewhere classified: Secondary | ICD-10-CM | POA: Diagnosis present

## 2010-12-13 DIAGNOSIS — I428 Other cardiomyopathies: Secondary | ICD-10-CM | POA: Diagnosis present

## 2010-12-13 DIAGNOSIS — Z992 Dependence on renal dialysis: Secondary | ICD-10-CM

## 2010-12-13 DIAGNOSIS — Z86718 Personal history of other venous thrombosis and embolism: Secondary | ICD-10-CM

## 2010-12-13 HISTORY — DX: End stage renal disease: N18.6

## 2010-12-13 HISTORY — DX: Syncope and collapse: R55

## 2010-12-13 HISTORY — DX: Dependence on renal dialysis: Z99.2

## 2010-12-13 HISTORY — DX: Aneurysm of iliac artery: I72.3

## 2010-12-13 HISTORY — DX: Secondary hyperparathyroidism of renal origin: N25.81

## 2010-12-13 HISTORY — DX: Immune thrombocytopenic purpura: D69.3

## 2010-12-13 HISTORY — DX: Cardiac arrest, cause unspecified: I46.9

## 2010-12-13 LAB — BASIC METABOLIC PANEL
BUN: 13 mg/dL (ref 6–23)
Calcium: 5.9 mg/dL — CL (ref 8.4–10.5)
Chloride: 99 mEq/L (ref 96–112)
GFR calc Af Amer: 31 mL/min — ABNORMAL LOW (ref >90)
GFR calc Af Amer: 20 mL/min — ABNORMAL LOW (ref >90)
GFR calc non Af Amer: 27 mL/min — ABNORMAL LOW (ref >90)
GFR calc non Af Amer: 17 mL/min — ABNORMAL LOW (ref >90)
Glucose, Bld: 180 mg/dL — ABNORMAL HIGH (ref 70–99)
Potassium: 2.6 mEq/L — CL (ref 3.5–5.1)
Potassium: 2.2 mEq/L — CL (ref 3.5–5.1)
Sodium: 139 mEq/L (ref 135–145)
Sodium: 136 mEq/L (ref 135–145)

## 2010-12-13 LAB — DIFFERENTIAL
Basophils Absolute: 0.1 10*3/uL (ref 0.0–0.1)
Basophils Relative: 1 % (ref 0–1)
Eosinophils Absolute: 0.3 10*3/uL (ref 0.0–0.7)
Eosinophils Relative: 4 % (ref 0–5)
Lymphocytes Relative: 40 % (ref 12–46)
Lymphs Abs: 3.4 10*3/uL (ref 0.7–4.0)
Monocytes Absolute: 0.4 K/uL (ref 0.1–1.0)
Monocytes Relative: 5 % (ref 3–12)
Neutro Abs: 4.3 10*3/uL (ref 1.7–7.7)
Neutrophils Relative %: 50 % (ref 43–77)

## 2010-12-13 LAB — POCT I-STAT 3, ART BLOOD GAS (G3+)
Acid-base deficit: 3 mmol/L — ABNORMAL HIGH (ref 0.0–2.0)
Bicarbonate: 24.8 meq/L — ABNORMAL HIGH (ref 20.0–24.0)
Bicarbonate: 27 mEq/L — ABNORMAL HIGH (ref 20.0–24.0)
Bicarbonate: 23.9 mEq/L (ref 20.0–24.0)
O2 Saturation: 100 %
Patient temperature: 98.6
TCO2: 27 mmol/L (ref 0–100)
TCO2: 28 mmol/L (ref 0–100)
TCO2: 25 mmol/L (ref 0–100)
pCO2 arterial: 61.7 mmHg (ref 35.0–45.0)
pCO2 arterial: 23.2 mmHg — ABNORMAL LOW (ref 35.0–45.0)
pCO2 arterial: 25.3 mmHg — ABNORMAL LOW (ref 35.0–45.0)
pH, Arterial: 7.213 — ABNORMAL LOW (ref 7.350–7.450)
pH, Arterial: 7.664 (ref 7.350–7.450)
pH, Arterial: 7.567 — ABNORMAL HIGH (ref 7.350–7.450)
pO2, Arterial: 364 mmHg — ABNORMAL HIGH (ref 80.0–100.0)

## 2010-12-13 LAB — BASIC METABOLIC PANEL WITH GFR
BUN: 7 mg/dL (ref 6–23)
CO2: 16 meq/L — ABNORMAL LOW (ref 19–32)
CO2: 26 meq/L (ref 19–32)
Calcium: 8 mg/dL — ABNORMAL LOW (ref 8.4–10.5)
Chloride: 106 meq/L (ref 96–112)
Creatinine, Ser: 2.17 mg/dL — ABNORMAL HIGH (ref 0.50–1.35)
Creatinine, Ser: 3.14 mg/dL — ABNORMAL HIGH (ref 0.50–1.35)
Glucose, Bld: 106 mg/dL — ABNORMAL HIGH (ref 70–99)

## 2010-12-13 LAB — POCT I-STAT, CHEM 8
BUN: 11 mg/dL (ref 6–23)
Calcium, Ion: 0.94 mmol/L — ABNORMAL LOW (ref 1.12–1.32)
Calcium, Ion: 0.94 mmol/L — ABNORMAL LOW (ref 1.12–1.32)
Chloride: 99 mEq/L (ref 96–112)
Chloride: 99 mEq/L (ref 96–112)
Creatinine, Ser: 3.6 mg/dL — ABNORMAL HIGH (ref 0.50–1.35)
Glucose, Bld: 136 mg/dL — ABNORMAL HIGH (ref 70–99)
Glucose, Bld: 118 mg/dL — ABNORMAL HIGH (ref 70–99)
HCT: 28 % — ABNORMAL LOW (ref 39.0–52.0)
Hemoglobin: 9.5 g/dL — ABNORMAL LOW (ref 13.0–17.0)
TCO2: 25 mmol/L (ref 0–100)

## 2010-12-13 LAB — CARDIAC PANEL(CRET KIN+CKTOT+MB+TROPI): Troponin I: 0.98 ng/mL (ref <0.30)

## 2010-12-13 LAB — PROTIME-INR
INR: 1.71 — ABNORMAL HIGH (ref 0.00–1.49)
INR: 1.6 — ABNORMAL HIGH (ref 0.00–1.49)
Prothrombin Time: 20.4 seconds — ABNORMAL HIGH (ref 11.6–15.2)
Prothrombin Time: 19.3 seconds — ABNORMAL HIGH (ref 11.6–15.2)

## 2010-12-13 LAB — CBC
HCT: 22.4 % — ABNORMAL LOW (ref 39.0–52.0)
Hemoglobin: 7.2 g/dL — ABNORMAL LOW (ref 13.0–17.0)
MCH: 29.3 pg (ref 26.0–34.0)
MCHC: 32.1 g/dL (ref 30.0–36.0)
MCV: 91.1 fL (ref 78.0–100.0)
Platelets: 93 10*3/uL — ABNORMAL LOW (ref 150–400)
RBC: 2.46 MIL/uL — ABNORMAL LOW (ref 4.22–5.81)
RDW: 19.7 % — ABNORMAL HIGH (ref 11.5–15.5)
WBC: 8.5 K/uL (ref 4.0–10.5)

## 2010-12-13 LAB — APTT
aPTT: 38 seconds — ABNORMAL HIGH (ref 24–37)
aPTT: 37 seconds (ref 24–37)

## 2010-12-13 LAB — GLUCOSE, CAPILLARY
Glucose-Capillary: 108 mg/dL — ABNORMAL HIGH (ref 70–99)
Glucose-Capillary: 102 mg/dL — ABNORMAL HIGH (ref 70–99)
Glucose-Capillary: 96 mg/dL (ref 70–99)
Glucose-Capillary: 100 mg/dL — ABNORMAL HIGH (ref 70–99)
Glucose-Capillary: 87 mg/dL (ref 70–99)

## 2010-12-13 LAB — POCT I-STAT TROPONIN I: Troponin i, poc: 0.15 ng/mL (ref 0.00–0.08)

## 2010-12-13 LAB — MRSA PCR SCREENING: MRSA by PCR: NEGATIVE

## 2010-12-13 MED ORDER — FENTANYL CITRATE 0.05 MG/ML IJ SOLN: 100.0000 ug | Freq: Once | INTRAMUSCULAR | Status: AC | PRN

## 2010-12-13 MED ORDER — MIDAZOLAM HCL 5 MG/ML IJ SOLN
1.0000 mg/h | INTRAMUSCULAR | Status: DC
  Administered 2010-12-13: 4 mg/h via INTRAVENOUS
  Administered 2010-12-14 (×2): 7 mg/h via INTRAVENOUS
  Filled 2010-12-13 (×6): qty 10

## 2010-12-13 MED ORDER — POTASSIUM CHLORIDE 10 MEQ/50ML IV SOLN
10.0000 meq | INTRAVENOUS | Status: AC
  Administered 2010-12-13 – 2010-12-14 (×3): 10 meq via INTRAVENOUS
  Filled 2010-12-13 (×4): qty 50

## 2010-12-13 MED ORDER — CHLORHEXIDINE GLUCONATE 0.12 % MT SOLN
15.0000 mL | Freq: Two times a day (BID) | OROMUCOSAL | Status: DC
  Administered 2010-12-14: 15 mL via OROMUCOSAL
  Filled 2010-12-13: qty 15

## 2010-12-13 MED ORDER — SODIUM CHLORIDE 0.9 % IV SOLN
INTRAVENOUS | Status: DC
  Administered 2010-12-13: 18:00:00 via INTRAVENOUS
  Administered 2010-12-15: 20 mL/h via INTRAVENOUS
  Administered 2010-12-17: 19:00:00 via INTRAVENOUS
  Administered 2010-12-18: 20 mL/h via INTRAVENOUS
  Administered 2010-12-21: 20 mL via INTRAVENOUS
  Administered 2010-12-24: 20 mL/h via INTRAVENOUS
  Administered 2010-12-25: 21:00:00 via INTRAVENOUS

## 2010-12-13 MED ORDER — PANTOPRAZOLE SODIUM 40 MG IV SOLR
40.0000 mg | INTRAVENOUS | Status: DC
  Administered 2010-12-14: 40 mg via INTRAVENOUS
  Filled 2010-12-13 (×2): qty 40

## 2010-12-13 MED ORDER — ARTIFICIAL TEARS OP OINT
1.0000 "application " | TOPICAL_OINTMENT | Freq: Three times a day (TID) | OPHTHALMIC | Status: DC
  Administered 2010-12-13 – 2010-12-16 (×10): 1 via OPHTHALMIC
  Filled 2010-12-13 (×2): qty 3.5

## 2010-12-13 MED ORDER — HEPARIN SODIUM (PORCINE) 5000 UNIT/ML IJ SOLN
5000.0000 [IU] | Freq: Three times a day (TID) | INTRAMUSCULAR | Status: DC
  Filled 2010-12-13 (×2): qty 1

## 2010-12-13 MED ORDER — MIDAZOLAM HCL 5 MG/ML IJ SOLN: 2.0000 mg | Freq: Once | INTRAMUSCULAR | Status: AC | PRN

## 2010-12-13 MED ORDER — MIDAZOLAM HCL 2 MG/2ML IJ SOLN
INTRAMUSCULAR | Status: AC
  Filled 2010-12-13: qty 6

## 2010-12-13 MED ORDER — POTASSIUM CHLORIDE 10 MEQ/100ML IV SOLN
INTRAVENOUS | Status: AC
  Filled 2010-12-13: qty 200

## 2010-12-13 MED ORDER — SODIUM CHLORIDE 0.9 % IV SOLN
25.0000 ug/h | INTRAVENOUS | Status: DC
  Administered 2010-12-13: 100 ug/h via INTRAVENOUS
  Administered 2010-12-14: 200 ug/h via INTRAVENOUS
  Filled 2010-12-13 (×3): qty 50

## 2010-12-13 MED ORDER — SODIUM CHLORIDE 0.9 % IV SOLN
INTRAVENOUS | Status: DC
  Administered 2010-12-13: 18:00:00 via INTRAVENOUS

## 2010-12-13 MED ORDER — CISATRACURIUM BESYLATE 10 MG/ML IV SOLN
1.0000 ug/kg/min | INTRAVENOUS | Status: DC
  Administered 2010-12-13: 1 ug/kg/min via INTRAVENOUS
  Filled 2010-12-13 (×2): qty 20

## 2010-12-13 MED ORDER — POTASSIUM CHLORIDE 10 MEQ/50ML IV SOLN
10.0000 meq | INTRAVENOUS | Status: AC
  Administered 2010-12-13 (×3): 10 meq via INTRAVENOUS
  Filled 2010-12-13: qty 50

## 2010-12-13 MED ORDER — POTASSIUM CHLORIDE 20 MEQ/15ML (10%) PO LIQD
20.0000 meq | Freq: Once | ORAL | Status: AC
  Administered 2010-12-13: 20 meq
  Filled 2010-12-13: qty 15

## 2010-12-13 MED ORDER — NOREPINEPHRINE BITARTRATE 1 MG/ML IJ SOLN
2.0000 ug/min | INTRAVENOUS | Status: DC
  Administered 2010-12-13: 10 ug/min via INTRAVENOUS
  Filled 2010-12-13 (×2): qty 4

## 2010-12-13 MED ORDER — EPINEPHRINE HCL 0.1 MG/ML IJ SOLN
INTRAMUSCULAR | Status: AC
  Filled 2010-12-13: qty 20

## 2010-12-13 MED ORDER — BIOTENE DRY MOUTH MT LIQD
1.0000 "application " | Freq: Four times a day (QID) | OROMUCOSAL | Status: DC
  Administered 2010-12-14 – 2011-01-01 (×74): 15 mL via OROMUCOSAL

## 2010-12-13 MED ORDER — MIDAZOLAM HCL 5 MG/ML IJ SOLN: 2.0000 mg | Freq: Once | INTRAMUSCULAR | Status: DC

## 2010-12-13 MED ORDER — FENTANYL BOLUS VIA INFUSION
50.0000 ug | INTRAVENOUS | Status: DC | PRN
  Filled 2010-12-13: qty 50

## 2010-12-13 MED ORDER — MIDAZOLAM BOLUS VIA INFUSION
2.0000 mg | INTRAVENOUS | Status: DC | PRN
  Filled 2010-12-13 (×2): qty 2

## 2010-12-13 MED ORDER — ASPIRIN 300 MG RE SUPP
300.0000 mg | Freq: Once | RECTAL | Status: AC
  Administered 2010-12-13: 300 mg via RECTAL
  Filled 2010-12-13: qty 1

## 2010-12-13 MED ORDER — FENTANYL CITRATE 0.05 MG/ML IJ SOLN
100.0000 ug | Freq: Once | INTRAMUSCULAR | Status: DC
  Filled 2010-12-13: qty 6
  Filled 2010-12-13: qty 4
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
## 2010-12-13 NOTE — ED Provider Notes (Addendum)
History     CSN: [id number] Arrival date & time: 12/03/2010 12:52 PM   First MD Initiated Contact with Patient 12/08/2010 1254      No chief complaint on file.   (Consider location/radiation/quality/duration/timing/severity/associated sxs/prior treatment) HPI Comments: Area is obtained by different [employer]. The patient is a post cardiac arrest patient. The patient is a dialysis patient and reportedly had dialysis this morning and had syncope at home. According to EMS, the patient had AED applied by fire which advised a shockable rhythm. He was shocked one time. EMS did give him 1 mg of epinephrine through a left eye oh. He was intubated orally at the scene. He also had an EJ established at the scene as well. Sinus rhythm with multiple premature beats was established with palpable blood pressure. The patient has been somewhat agitated but is not responding purposefully. Other review of systems in pertinent history is not obtainable from the patient. Level 5 caveat due to intubated  The history is provided by the EMS personnel. The history is limited by the condition of the patient.    Past Medical History  Diagnosis Date  . Chronic systolic heart failure 09/06/2009  . Rhabdomyolysis   . Renal disease   . Cardiomyopathy   . HTN (hypertension)   . Hyperlipemia   . Anemia   . Gout   . Tricuspid regurgitation   . Thrombus   . Hyperkalemia   . Thyroid disease   . Cancer     prostate  . COPD (chronic obstructive pulmonary disease)   . CHF (congestive heart failure)   . GERD (gastroesophageal reflux disease)     Past Surgical History  Procedure Date  . Hernia repair   . Av fistula placement   . Jugular catheter placement   . Amputation 1977    traumatic amputation of left thumb    History reviewed. No pertinent family history.  History  Substance Use Topics  . Smoking status: Former Smoker -- 40 years    Types: Cigarettes  . Smokeless tobacco: Not on file  . Alcohol  Use: No      Review of Systems  Unable to perform ROS: Unstable vital signs    Allergies  Fortaz  Home Medications   Current Outpatient Rx  Name Route Sig Dispense Refill  . ASPIRIN 81 MG PO TABS Oral Take 81 mg by mouth daily.      Marland Kitchen RENA-VITE PO Oral Take 1 tablet by mouth daily.      Marland Kitchen CARVEDILOL 12.5 MG PO TABS Oral Take 12.5 mg by mouth 2 (two) times daily with a meal.      . CHLORPROMAZINE HCL 25 MG PO TABS Oral Take 25 mg by mouth as needed.      Marland Kitchen FINASTERIDE 5 MG PO TABS Oral Take 5 mg by mouth daily.      . ISOSORBIDE MONONITRATE ER 30 MG PO TB24 Oral Take 30 mg by mouth daily.      Marland Kitchen OMEPRAZOLE 40 MG PO CPDR Oral Take 40 mg by mouth daily.        BP 149/104  Pulse 33  Resp 17  Ht 5\' 5"  (1.651 m)  Wt 149 lb 14.4 oz (67.994 kg)  BMI 24.94 kg/m2  SpO2 82%  Physical Exam  Constitutional: He appears well-developed and well-nourished. He is intubated.  HENT:  Head: Normocephalic and atraumatic.  Cardiovascular: Normal rate, S1 normal and S2 normal.   No murmur heard. Pulmonary/Chest: He is intubated. He  has wheezes. He has rales.  Abdominal: Soft. He exhibits no distension.  Musculoskeletal: He exhibits no edema.  Skin: Skin is warm and dry.    ED Course  Procedures (including critical care time)  Labs Reviewed  GLUCOSE, CAPILLARY - Abnormal; Notable for the following:    Glucose-Capillary 108 (*)    All other components within normal limits  BASIC METABOLIC PANEL - Abnormal; Notable for the following:    Potassium 2.6 (*)    CO2 16 (*)    Glucose, Bld 180 (*)    Creatinine, Ser 2.17 (*)    Calcium 5.9 (*)    GFR calc non Af Amer 27 (*)    GFR calc Af Amer 31 (*)    All other components within normal limits  PROTIME-INR - Abnormal; Notable for the following:    Prothrombin Time 20.4 (*)    INR 1.71 (*)    All other components within normal limits  APTT - Abnormal; Notable for the following:    aPTT 38 (*)    All other components within normal  limits  CBC - Abnormal; Notable for the following:    RBC 2.46 (*)    Hemoglobin 7.2 (*)    HCT 22.4 (*)    RDW 19.7 (*)    Platelets 93 (*) PLATELET COUNT CONFIRMED BY SMEAR   All other components within normal limits  POCT I-STAT TROPONIN I - Abnormal; Notable for the following:    Troponin i, poc 0.15 (*)    All other components within normal limits  POCT I-STAT 3, BLOOD GAS (G3+) - Abnormal; Notable for the following:    pH, Arterial 7.213 (*)    pCO2 arterial 61.7 (*)    pO2, Arterial 364.0 (*)    Bicarbonate 24.8 (*)    Acid-base deficit 3.0 (*)    All other components within normal limits  DIFFERENTIAL  BLOOD GAS, ARTERIAL  BASIC METABOLIC PANEL  BASIC METABOLIC PANEL  BASIC METABOLIC PANEL  BASIC METABOLIC PANEL  BASIC METABOLIC PANEL  POCT CBG MONITORING  PROTIME-INR  APTT  I-STAT TROPONIN I   Ct Head Wo Contrast  12-17-2010  *RADIOLOGY REPORT*  Clinical Data: Syncope.  Post cardiac arrest.  End-stage renal disease.  CT HEAD WITHOUT CONTRAST  Technique:  Contiguous axial images were obtained from the base of the skull through the vertex without contrast.  Comparison: 10/09/2008.  Findings: No intracranial hemorrhage.  Prominent small vessel disease type changes.  Remote right thalamic tiny infarct.  No CT evidence of large acute infarct.  Small acute infarct cannot be excluded by CT.  No intracranial mass lesion detected on this unenhanced exam.  Global atrophy without hydrocephalus.  Vascular calcifications.  Air-fluid level right sphenoid sinus.  IMPRESSION: No intracranial hemorrhage or CT evidence of large acute infarct.  Remote small right thalamic infarct.  Prominent small vessel disease type changes.  Global atrophy.  Air-fluid level right sphenoid sinus.  Original Report Authenticated By: Fuller Canada, M.D.   Dg Chest Portable 1 View  December 17, 2010  *RADIOLOGY REPORT*  Clinical Data: Intubated, unresponsive  PORTABLE CHEST - 1 VIEW  Comparison: 11/13/2010   Findings: Endotracheal tube tip 3.5 cm above carina.  Nasogastric tube been placed at least as far as the stomach, tip not seen. Right IJ central line stable in position.  There are new alveolar opacities centrally in both lung bases and in the right upper lobe. No definite effusion although the lateral costophrenic angles are excluded.  Heart size  upper limits normal for technique.  IMPRESSION: 1.  Endotracheal tube and nasogastric tube placement as above. 2.  New bibasilar right upper lobe alveolar opacities, possibly asymmetric edema versus infiltrates.  Original Report Authenticated By: Osa Craver, M.D.     1. Cardiac arrest     CRITICAL CARE Performed by: Lear Ng   Total critical care time: 40 min  Critical care time was exclusive of separately billable procedures and treating other patients.  Critical care was necessary to treat or prevent imminent or life-threatening deterioration.  Critical care was time spent personally by me on the following activities: development of treatment plan with patient and/or surrogate as well as nursing, discussions with consultants, evaluation of patient's response to treatment, examination of patient, obtaining history from patient or surrogate, ordering and performing treatments and interventions, ordering and review of laboratory studies, ordering and review of radiographic studies, pulse oximetry and re-evaluation of patient's condition.  1:57 PM I reviewed CXR and head CT myself.   MDM  EKG at time 1314. Heart rate of 95, sinus rhythm. PVCs are seen. No QRS voltages with poor R-wave progression in leads V1 through V5. Inverted T waves seen in lateral leads evident on prior EKG dated 10/17/2010.  Pt is post CPR and defibrillation. He is currently intubated with bilateral breath sounds. Portable chest x-ray is pending. His current blood pressure is elevated at about 180/90 a line. My plan is to obtain a head CT to ensure that  intracerebral hemorrhage is not cause of the patient's arrest. Otherwise my plan is to initiate R. take some hypothermia protocol as his mental status is not improving with return of circulation. EKG does not show acute ST elevation MI. Pt will need admission  Into the ICU.       ICU physician has been down and assessed pt in the ED.  Will be admitted to ICU.    Gavin Pound. Oletta Lamas, MD 12/16/2010 1334  Gavin Pound. [facility]ez, MD 12/07/2010 1357  I spoke to Dr. Lowell Guitar and discussed his electrolytes abn's and he reports that those are in line with someone who just completed dialysis.    Gavin Pound. Waylon Hershey, MD 12/08/2010 1421

## 2010-12-13 NOTE — Procedures (Signed)
Arterial Catheter Insertion Procedure Note Jackson [name] [id number] Jun 30, 1928  Procedure: Insertion of Arterial Catheter  Indications: Blood pressure monitoring  Procedure Details Consent: Unable to obtain consent because of emergent medical necessity. Time Out: Verified patient identification, verified procedure, site/side was marked, verified correct patient position, special equipment/implants available, medications/allergies/relevent history reviewed, required imaging and test results available.  Performed  Maximum sterile technique was used including antiseptics, gloves, gown, hand hygiene and mask. Skin prep: Chlorhexidine; local anesthetic administered 20 gauge catheter was inserted into right radial artery using the Seldinger technique.  Evaluation Blood flow good; BP tracing good. Complications: No apparent complications.   Carrington Clamp 11/23/2010

## 2010-12-13 NOTE — Progress Notes (Signed)
CRITICAL VALUE ALERT  Critical value received:  Troponin 0.98   Date of notification: 12/12/2010  Time of notification:  1900   Critical value read back:yes  Nurse who received alert:  Dayna Barker  MD notified (1st page):  Dr. Vassie Loll  Time of first page:  1900  MD notified (2nd page):  Time of second page:  Responding MD:  Dr. Vassie Loll  Time MD responded:  1900

## 2010-12-13 NOTE — H&P (Signed)
Name: AGASTYA MEISTER MRN: [id number] DOB: 1929-01-06  DOS:   12/20/2010     CRITICAL CARE MEDICINE ADMISSION / CONSULTATION NOTE  Referring Physician  Gavin Pound. Ghir  Reason For Consult  Status post cardiac arres  Patient description: 77 y/o AAM with ESRD on HD who had dialysis this morning and had syncope at home. According to EMS, the patient had AED applied by fire which advised a shockable rhythm. He was shocked one time. EMS did give him 1 mg of epinephrine through a left eye oh. He was intubated orally at the scene. He also had an EJ established at the scene as well. Sinus rhythm with multiple premature beats was established with palpable blood pressure. The patient has been somewhat agitated but is not responding purposefully. Other review of systems in pertinent history is not obtainable from the patient.  Lines / Drains: 11/24  R tunneled HD catheter (preadmission) 11/24  ETT>>> 11/24  OGT>>> 11/24  L [place] TLC>>>  Cultures / Sepsis markers: None  Antibiotics: Note  Tests / Events: 12-20-10  Cardiac arrest post HD, ACLS, hypothermia protocol started  The patient is sedated, intubated and unable to provide history, which was obtained for available medical records.    History Of Present Illness: 77 y/o AAM with ESRD on HD who had dialysis this morning and had syncope at home. According to EMS, the patient had AED applied by fire which advised a shockable rhythm. He was shocked one time. EMS did give him 1 mg of epinephrine through a left eye oh. He was intubated orally at the scene. He also had an EJ established at the scene as well. Sinus rhythm with multiple premature beats was established with palpable blood pressure. The patient has been somewhat agitated but is not responding purposefully. Other review of systems in pertinent history is not obtainable from the patient.  Past Medical History  Diagnosis Date  . Chronic systolic heart failure 09/06/2009  . Rhabdomyolysis     . Renal disease   . Cardiomyopathy   . HTN (hypertension)   . Hyperlipemia   . Anemia   . Gout   . Tricuspid regurgitation   . Thrombus   . Hyperkalemia   . Thyroid disease   . Cancer     prostate  . COPD (chronic obstructive pulmonary disease)   . CHF (congestive heart failure)   . GERD (gastroesophageal reflux disease)     Past Surgical History  Procedure Date  . Hernia repair   . Av fistula placement   . Jugular catheter placement   . Amputation 1977    traumatic amputation of left thumb    Prior to Admission medications   Medication Sig Start Date End Date Taking? Authorizing Provider  aspirin 81 MG tablet Take 81 mg by mouth daily.     Yes Historical Provider, MD  B Complex-C-Folic Acid (RENA-VITE PO) Take 1 tablet by mouth daily.     Yes Historical Provider, MD  carvedilol (COREG) 12.5 MG tablet Take 12.5 mg by mouth 2 (two) times daily with a meal.     Yes Historical Provider, MD  chlorproMAZINE (THORAZINE) 25 MG tablet Take 25 mg by mouth as needed.     Yes Historical Provider, MD  finasteride (PROSCAR) 5 MG tablet Take 5 mg by mouth daily.     Yes Historical Provider, MD  isosorbide mononitrate (IMDUR) 30 MG 24 hr tablet Take 30 mg by mouth daily.     Yes Historical Provider, MD  omeprazole (PRILOSEC) 40 MG capsule Take 40 mg by mouth daily.     Yes Historical Provider, MD    Allergies  Allergen Reactions  . Fortaz (Ceftazidime) Itching    History reviewed. No pertinent family history.  Social History  reports that he has quit smoking. His smoking use included Cigarettes. He quit after 40 years of use. He does not have any smokeless tobacco history on file. He reports that he does not drink alcohol. His drug history not on file.  Review Of Systems  11 points review of systems is negative with an exception of listed in HPI.  Physical Examination  BP 190/98  Pulse 103  Temp(Src) 99 F (37.2 C) (Core (Comment))  Resp 17  Ht 5\' 5"  (1.651 m)  Wt 67.994  kg (149 lb 14.4 oz)  BMI 24.94 kg/m2  SpO2 99%  Neuro:  Sedated, intubated, mechanically ventilated HEENT:  ETT/OGT Heart:  RRR, no M/R/G Lungs:  Bilateral air entry, no W/R/R, HD catheter R chest wall Abdomen:  Soft, non tender, bowel sounds present Extremities:  No edema  Labs  Results for orders placed during the hospital encounter of 11/22/2010 (from the past 24 hour(s))  GLUCOSE, CAPILLARY     Status: Abnormal   Collection Time   12/16/2010 12:52 PM      Component Value Range   Glucose-Capillary 108 (*) 70 - 99 (mg/dL)  PROTIME-INR     Status: Abnormal   Collection Time   12/11/2010 12:57 PM      Component Value Range   Prothrombin Time 20.4 (*) 11.6 - 15.2 (seconds)   INR 1.71 (*) 0.00 - 1.49   APTT     Status: Abnormal   Collection Time   12/04/2010 12:57 PM      Component Value Range   aPTT 38 (*) 24 - 37 (seconds)  CBC     Status: Abnormal   Collection Time   12/15/2010 12:57 PM      Component Value Range   WBC 8.5  4.0 - 10.5 (K/uL)   RBC 2.46 (*) 4.22 - 5.81 (MIL/uL)   Hemoglobin 7.2 (*) 13.0 - 17.0 (g/dL)   HCT 25.3 (*) 66.4 - 52.0 (%)   MCV 91.1  78.0 - 100.0 (fL)   MCH 29.3  26.0 - 34.0 (pg)   MCHC 32.1  30.0 - 36.0 (g/dL)   RDW 40.3 (*) 47.4 - 15.5 (%)   Platelets 93 (*) 150 - 400 (K/uL)  DIFFERENTIAL     Status: Normal   Collection Time   12/02/2010 12:57 PM      Component Value Range   Neutrophils Relative 50  43 - 77 (%)   Lymphocytes Relative 40  12 - 46 (%)   Monocytes Relative 5  3 - 12 (%)   Eosinophils Relative 4  0 - 5 (%)   Basophils Relative 1  0 - 1 (%)   Neutro Abs 4.3  1.7 - 7.7 (K/uL)   Lymphs Abs 3.4  0.7 - 4.0 (K/uL)   Monocytes Absolute 0.4  0.1 - 1.0 (K/uL)   Eosinophils Absolute 0.3  0.0 - 0.7 (K/uL)   Basophils Absolute 0.1  0.0 - 0.1 (K/uL)   RBC Morphology POLYCHROMASIA PRESENT     Smear Review LARGE PLATELETS PRESENT    POCT I-STAT TROPONIN I     Status: Abnormal   Collection Time   11/27/2010 12:59 PM      Component Value Range    Troponin i, poc 0.15 (*)  0.00 - 0.08 (ng/mL)   Comment NOTIFIED PHYSICIAN     Comment 3           BASIC METABOLIC PANEL     Status: Abnormal   Collection Time   11/23/2010  1:01 PM      Component Value Range   Sodium 139  135 - 145 (mEq/L)   Potassium 2.6 (*) 3.5 - 5.1 (mEq/L)   Chloride 106  96 - 112 (mEq/L)   CO2 16 (*) 19 - 32 (mEq/L)   Glucose, Bld 180 (*) 70 - 99 (mg/dL)   BUN 7  6 - 23 (mg/dL)   Creatinine, Ser 1.61 (*) 0.50 - 1.35 (mg/dL)   Calcium 5.9 (*) 8.4 - 10.5 (mg/dL)   GFR calc non Af Amer 27 (*) >90 (mL/min)   GFR calc Af Amer 31 (*) >90 (mL/min)  POCT I-STAT 3, BLOOD GAS (G3+)     Status: Abnormal   Collection Time   11/30/2010  1:21 PM      Component Value Range   pH, Arterial 7.213 (*) 7.350 - 7.450    pCO2 arterial 61.7 (*) 35.0 - 45.0 (mmHg)   pO2, Arterial 364.0 (*) 80.0 - 100.0 (mmHg)   Bicarbonate 24.8 (*) 20.0 - 24.0 (mEq/L)   TCO2 27  0 - 100 (mmol/L)   O2 Saturation 100.0     Acid-base deficit 3.0 (*) 0.0 - 2.0 (mmol/L)   Patient temperature 98.6 F     Collection site ARTERIAL LINE     Sample type ARTERIAL     Comment NOTIFIED PHYSICIAN      Imaging  Ct Head Wo Contrast  12/02/2010  *RADIOLOGY REPORT*  Clinical Data: Syncope.  Post cardiac arrest.  End-stage renal disease.  CT HEAD WITHOUT CONTRAST  Technique:  Contiguous axial images were obtained from the base of the skull through the vertex without contrast.  Comparison: 10/09/2008.  Findings: No intracranial hemorrhage.  Prominent small vessel disease type changes.  Remote right thalamic tiny infarct.  No CT evidence of large acute infarct.  Small acute infarct cannot be excluded by CT.  No intracranial mass lesion detected on this unenhanced exam.  Global atrophy without hydrocephalus.  Vascular calcifications.  Air-fluid level right sphenoid sinus.  IMPRESSION: No intracranial hemorrhage or CT evidence of large acute infarct.  Remote small right thalamic infarct.  Prominent small vessel disease  type changes.  Global atrophy.  Air-fluid level right sphenoid sinus.  Original Report Authenticated By: Fuller Canada, M.D.   Dg Chest Portable 1 View  12/02/2010  *RADIOLOGY REPORT*  Clinical Data: Intubated, unresponsive  PORTABLE CHEST - 1 VIEW  Comparison: 11/13/2010  Findings: Endotracheal tube tip 3.5 cm above carina.  Nasogastric tube been placed at least as far as the stomach, tip not seen. Right IJ central line stable in position.  There are new alveolar opacities centrally in both lung bases and in the right upper lobe. No definite effusion although the lateral costophrenic angles are excluded.  Heart size upper limits normal for technique.  IMPRESSION: 1.  Endotracheal tube and nasogastric tube placement as above. 2.  New bibasilar right upper lobe alveolar opacities, possibly asymmetric edema versus infiltrates.  Original Report Authenticated By: Osa Craver, M.D.    Assessment and Plan: Status post cardiac arrest (ventricular fibrillation) on the background of systolic congestive heart failure and cardiomyopathy. -->hypothermia protocol -->place TLC  Ventilator dependent respiratory failure -->full ventilatory support -->f/u ABG and CXR  End stage kidney disease  on HD -->consult Nephrology  Hypokalemia -->replaced -->repeat BMP  Coagulopathy -->monitor  Best practices / Disposition -->ICU status under PCCM -->consult Cardiology and Nephrology -->SCDs for DVT Px -->Protonix for GI Px -->NPO  The patient is critically ill with multiple organ systems failure and requires high complexity decision making for assessment and support, frequent evaluation and titration of therapies, application of advanced monitoring technologies and extensive interpretation of multiple databases. Critical Care Time devoted to patient care services described in this note is 35 minutes.  Orlean Bradford, M.D. Pulmonary and Critical Care Medicine [facility] Cell: [phone]  11/28/2010, 3:45 PM

## 2010-12-13 NOTE — ED Notes (Signed)
Abnormal lab test results handed to Oletta Lamas, MD

## 2010-12-13 NOTE — ED Notes (Signed)
Pt transported to ct scanner with rn at the bedside at 13:30. Upon return to room pt given bolus of nimbex per protocol and drip started. Sedation titrated to patient's needs. After nimbex bolus infused and pt stopped breathing over the vent the cooling protocols were initiated at 1400. Nursing to continue to monitor. Waiting on bed on 2900. Pt remains st on the monitor with frequent pvc's. md aware and waiting for other medication orders to replace low potassium.

## 2010-12-13 NOTE — Progress Notes (Signed)
eLink Physician-Brief Progress Note Patient Name: Jackson [name] DOB: Jul 11, 1928 MRN: [id number]  Date of Service  12/07/2010   HPI/Events of Note   hypoalemia  eICU Interventions  repleted   Intervention Category Intermediate Interventions: Electrolyte abnormality - evaluation and management  [name] V. 11/30/2010, 6:52 PM

## 2010-12-13 NOTE — ED Notes (Signed)
Family contact is his daughter kawika bischoff [phone] cell [phone]

## 2010-12-13 NOTE — Procedures (Signed)
**Note Jackson-Identified via Obfuscation** Name:  [name] MRN:  [id number] DOB:  [birth year]  DOS:  PROCEDURE NOTE  Procedure:  Central venous catheter placement.  Indications:  Need for intravenous access and hemodynamic monitoring.  Consent:  Consent was implied due to the emergency nature of the procedure.  Anesthesia:  A total of 10 mL of 1% Lidocaine was used for local infiltration anesthesia.  Procedure summary:  Appropriate equipment was assembled.  The patient was identified as Jackson [name] and safety timeout was performed. The patient was placed in Trendelenburg position.  Sterile technique was used. The patient's left neck and chest region was prepped using chlorhexidine / alcohol scrub and the field was draped in usual sterile fashion with full body drape. After the adequate anesthesia was achieved, the vein was cannulated with the introducer needle without difficulty. A guide wire was advanced through the introducer needle, which was then withdrawn. A small skin incision was made at the point of wire entry, the dilator was inserted over the guide wire and appropriate dilation was obtained. The dilator was removed and central venous triple-lumen catheter was advanced over the guide wire, which was then removed.  All ports were aspirated and flushed with normal saline without difficulty. The catheter was sutured in place. Antibiotic patch was placed and sterile dressing was applied. Post-procedure chest x-ray was ordered.  Complications:  No immediate complications were noted.  Hemodynamic parameters and oxygenation remained stable throughout the procedure.  Estimated blood loss:  Less then 5 mL.  Orlean Bradford, M.D. Pulmonary and Critical Care Medicine [facility] Cell: [phone]

## 2010-12-14 ENCOUNTER — Encounter (HOSPITAL_COMMUNITY): Payer: Self-pay

## 2010-12-14 DIAGNOSIS — I469 Cardiac arrest, cause unspecified: Secondary | ICD-10-CM

## 2010-12-14 DIAGNOSIS — J9600 Acute respiratory failure, unspecified whether with hypoxia or hypercapnia: Secondary | ICD-10-CM

## 2010-12-14 DIAGNOSIS — Z9911 Dependence on respirator [ventilator] status: Secondary | ICD-10-CM

## 2010-12-14 DIAGNOSIS — N186 End stage renal disease: Secondary | ICD-10-CM

## 2010-12-14 LAB — BASIC METABOLIC PANEL
BUN: 10 mg/dL (ref 6–23)
BUN: 17 mg/dL (ref 6–23)
CO2: 16 mEq/L — ABNORMAL LOW (ref 19–32)
CO2: 15 mEq/L — ABNORMAL LOW (ref 19–32)
Calcium: 8.3 mg/dL — ABNORMAL LOW (ref 8.4–10.5)
Chloride: 120 mEq/L — ABNORMAL HIGH (ref 96–112)
Creatinine, Ser: 2.05 mg/dL — ABNORMAL HIGH (ref 0.50–1.35)
Creatinine, Ser: 3.58 mg/dL — ABNORMAL HIGH (ref 0.50–1.35)
GFR calc Af Amer: 34 mL/min — ABNORMAL LOW (ref >90)
GFR calc Af Amer: 17 mL/min — ABNORMAL LOW (ref >90)
GFR calc non Af Amer: 29 mL/min — ABNORMAL LOW (ref >90)
GFR calc non Af Amer: 15 mL/min — ABNORMAL LOW (ref >90)
Glucose, Bld: 76 mg/dL (ref 70–99)
Glucose, Bld: 75 mg/dL (ref 70–99)
Potassium: 2.2 mEq/L — CL (ref 3.5–5.1)
Sodium: 143 mEq/L (ref 135–145)

## 2010-12-14 LAB — POCT I-STAT 3, ART BLOOD GAS (G3+)
O2 Saturation: 98 %
TCO2: 28 mmol/L (ref 0–100)
pCO2 arterial: 31.1 mmHg — ABNORMAL LOW (ref 35.0–45.0)
pH, Arterial: 7.524 — ABNORMAL HIGH (ref 7.350–7.450)
pO2, Arterial: 77 mmHg — ABNORMAL LOW (ref 80.0–100.0)

## 2010-12-14 LAB — GLUCOSE, CAPILLARY
Glucose-Capillary: 90 mg/dL (ref 70–99)
Glucose-Capillary: 97 mg/dL (ref 70–99)
Glucose-Capillary: 61 mg/dL — ABNORMAL LOW (ref 70–99)
Glucose-Capillary: 75 mg/dL (ref 70–99)
Glucose-Capillary: 87 mg/dL (ref 70–99)

## 2010-12-14 LAB — CARDIAC PANEL(CRET KIN+CKTOT+MB+TROPI)
CK, MB: 6.5 ng/mL (ref 0.3–4.0)
Relative Index: INVALID (ref 0.0–2.5)
Total CK: 79 U/L (ref 7–232)
Troponin I: 0.9 ng/mL (ref <0.30)

## 2010-12-14 LAB — COMPREHENSIVE METABOLIC PANEL
ALT: 31 U/L (ref 0–53)
BUN: 12 mg/dL (ref 6–23)
CO2: 17 mEq/L — ABNORMAL LOW (ref 19–32)
Calcium: 5.6 mg/dL — CL (ref 8.4–10.5)
GFR calc Af Amer: 27 mL/min — ABNORMAL LOW (ref >90)
GFR calc non Af Amer: 23 mL/min — ABNORMAL LOW (ref >90)
Glucose, Bld: 77 mg/dL (ref 70–99)
Sodium: 139 mEq/L (ref 135–145)

## 2010-12-14 LAB — PROTIME-INR
INR: 1.91 — ABNORMAL HIGH (ref 0.00–1.49)
Prothrombin Time: 22.2 seconds — ABNORMAL HIGH (ref 11.6–15.2)

## 2010-12-14 LAB — APTT: aPTT: 50 seconds — ABNORMAL HIGH (ref 24–37)

## 2010-12-14 MED ORDER — POTASSIUM CHLORIDE 10 MEQ/50ML IV SOLN
INTRAVENOUS | Status: AC
  Administered 2010-12-14: 10 meq via INTRAVENOUS
  Filled 2010-12-14: qty 50

## 2010-12-14 MED ORDER — DEXTROSE 10 % IV SOLN
INTRAVENOUS | Status: DC
  Administered 2010-12-14: 18:00:00 via INTRAVENOUS
  Administered 2010-12-15: 50 mL/h via INTRAVENOUS
  Administered 2010-12-15 – 2010-12-16 (×3): via INTRAVENOUS

## 2010-12-14 MED ORDER — CALCIUM GLUCONATE 10 % IV SOLN
1.0000 g | Freq: Once | INTRAVENOUS | Status: AC
  Administered 2010-12-14: 1 g via INTRAVENOUS
  Filled 2010-12-14: qty 10

## 2010-12-14 MED ORDER — POTASSIUM CHLORIDE 10 MEQ/50ML IV SOLN
10.0000 meq | INTRAVENOUS | Status: AC
  Administered 2010-12-14 (×4): 10 meq via INTRAVENOUS
  Filled 2010-12-14 (×2): qty 50

## 2010-12-14 MED ORDER — DEXTROSE 5 % IV SOLN
2.0000 ug/min | INTRAVENOUS | Status: DC
  Administered 2010-12-14: 7 ug/min via INTRAVENOUS
  Administered 2010-12-16: 14 ug/min via INTRAVENOUS
  Filled 2010-12-14 (×4): qty 16

## 2010-12-14 MED ORDER — SODIUM CHLORIDE 0.9 % IV SOLN
1.0000 g | Freq: Once | INTRAVENOUS | Status: AC
  Administered 2010-12-14: 1 g via INTRAVENOUS
  Filled 2010-12-14: qty 10

## 2010-12-14 MED ORDER — LEVALBUTEROL HCL 0.63 MG/3ML IN NEBU
0.6300 mg | INHALATION_SOLUTION | Freq: Four times a day (QID) | RESPIRATORY_TRACT | Status: DC
  Administered 2010-12-14 (×2): 0.63 mg via RESPIRATORY_TRACT
  Filled 2010-12-14 (×4): qty 3

## 2010-12-14 MED ORDER — DEXTROSE 50 % IV SOLN
INTRAVENOUS | Status: AC
  Administered 2010-12-14: 25 mL via INTRAVENOUS
  Filled 2010-12-14: qty 50

## 2010-12-14 MED ORDER — CHLORHEXIDINE GLUCONATE 0.12 % MT SOLN
15.0000 mL | Freq: Two times a day (BID) | OROMUCOSAL | Status: DC
  Administered 2010-12-14 – 2010-12-29 (×31): 15 mL via OROMUCOSAL
  Filled 2010-12-14 (×31): qty 15

## 2010-12-14 MED ORDER — LEVALBUTEROL HCL 0.63 MG/3ML IN NEBU
0.6300 mg | INHALATION_SOLUTION | RESPIRATORY_TRACT | Status: DC | PRN
  Administered 2010-12-14 – 2011-01-06 (×3): 0.63 mg via RESPIRATORY_TRACT
  Filled 2010-12-14: qty 3

## 2010-12-14 MED ORDER — MAGNESIUM SULFATE 50 % IJ SOLN
2.0000 g | Freq: Once | INTRAVENOUS | Status: AC
  Administered 2010-12-14: 2 g via INTRAVENOUS
  Filled 2010-12-14: qty 4

## 2010-12-14 MED ORDER — POTASSIUM CHLORIDE 10 MEQ/50ML IV SOLN
10.0000 meq | INTRAVENOUS | Status: AC
  Administered 2010-12-14 (×6): 10 meq via INTRAVENOUS
  Filled 2010-12-14 (×6): qty 50

## 2010-12-14 NOTE — Procedures (Signed)
Arterial Catheter Insertion Procedure Note Jackson [name] [id number] 1928-02-15  Procedure: Insertion of Arterial Catheter  Indications: Blood pressure monitoring  Procedure Details Consent: Unable to obtain consent because of altered level of consciousness. Time Out: Verified patient identification, verified procedure, site/side was marked, verified correct patient position, special equipment/implants available, medications/allergies/relevent history reviewed, required imaging and test results available.  Performed  Maximum sterile technique was used includingSkin prep: Chlorhexidine; local anesthetic administered 20 gauge catheter was inserted into left radial artery using the Seldinger technique.  Evaluation Blood flow good; BP tracing good. Complications: No apparent complications.   Carrington Clamp 12/14/2010

## 2010-12-14 NOTE — Progress Notes (Signed)
Addendum correction  Dialysis scheduled for TTS

## 2010-12-14 NOTE — Progress Notes (Signed)
eLink Physician-Brief Progress Note Patient Name: Jackson [name] DOB: [birth year] MRN: [id number]  Date of Service  12/14/2010   HPI/Events of Note   Lytes low  eICU Interventions  Aggressive reaplce, ESRD noted   Intervention Category Major Interventions: Electrolyte abnormality - evaluation and management Intermediate Interventions: Electrolyte abnormality - evaluation and management  FEINSTEIN,DANIEL J. 12/14/2010, 2:37 AM

## 2010-12-14 NOTE — Progress Notes (Signed)
eLink Physician-Brief Progress Note Patient Name: Jackson [name] DOB: 07-Dec-1928 MRN: [id number]  Date of Service  12/14/2010   HPI/Events of Note  Monitoring of K - rewarming phase -expect to rise Hypoglycemia    eICU Interventions  D10 for low sugars Cards consult called OK to replace A-line    Intervention Category Minor Interventions: Electrolytes abnormality - evaluation and management  Mette Southgate V. 12/14/2010, 5:01 PM

## 2010-12-14 NOTE — Progress Notes (Signed)
CRITICAL VALUE ALERT  Critical value received:  K 2.6, Ca 4.5  Date of notification:  12/14/2010   Time of notification:  1040  Critical value read back:yes  Nurse who received alert:  Dayna Barker  MD notified (1st page):  Dr. Sung Amabile  Time of first page:  1050  MD notified (2nd page):  Time of second page:  Responding MD:  Dr. Sung Amabile  Time MD responded:  1050

## 2010-12-14 NOTE — Progress Notes (Signed)
CRITICAL VALUE ALERT  Critical value received: K+  Date of notification:12/14/2010  Time of notification:  0229  Critical value read back:yes  Nurse who received alert:  Rise Paganini  MD notified (1st page):  Tyson Alias  Time of first page:  0229  MD notified (2nd page):  Time of second page:  Responding MD:  Tyson Alias  Time MD responded:  [phone]

## 2010-12-14 NOTE — Progress Notes (Signed)
CRITICAL VALUE ALERT  Critical value received:  Ca 5.6  Date of notification:  12/14/2010  Time of notification:  1705  Critical value read back:yes  Nurse who received alert:  Dayna Barker  MD notified (1st page):  Dr. Vassie Loll  Time of first page:  1710  MD notified (2nd page):  Time of second page:  Responding MD:  Dr. Vassie Loll  Time MD responded:  1710

## 2010-12-14 NOTE — Consult Note (Signed)
[facility] Renal Consultation Note  Indication for Consultation:  Management of ESRD/hemodialysis; anemia, hypertension/volume and secondary hyperparathyroidism  HPI: Jackson [name] is a 77 y.o. male yesterday after hd had syncopal episode at home with  EMS called=AED placed and pt. Shocked and intubated at the scene with cardiopulmonary arrest. He had an uneventful hd tx ,with only.3 liters uf and bp fairly stable for him. Noted recent hgb trend down7.5 10/27,7.711/01,8.2 on 11/08 and 6.4 on 11/15 epogen was increased10,0,00 to 15000, with no bloody stools.repoted. He had his AVF clotted x2 in oct and rt ij perm cath used awaiting apt for new avgg.  Dialysis Orders: Center: [facility]  on tths . EDW 64kg HD Bath 2.0 k, 2.25 ca  Time 4.0 hrs Heparin 2000. Access rt ij perm cath  BFR 400 DFR 800    Zemplar 4 mcg IV/HD Epogen 15,00   Units IV/HD  Venofer  0  Other 0    Past Medical History  Diagnosis Date  . Chronic systolic heart failure 09/06/2009  . Rhabdomyolysis   . Renal disease   . Cardiomyopathy   . HTN (hypertension)   . Hyperlipemia   . Anemia   . Gout   . Tricuspid regurgitation   . Thrombus   . Hyperkalemia   . Thyroid disease   . Cancer     prostate  . COPD (chronic obstructive pulmonary disease)   . CHF (congestive heart failure)   . GERD (gastroesophageal reflux disease)     Past Surgical History  Procedure Date  . Hernia repair   . Av fistula placement   . Jugular catheter placement   . Amputation 1977    traumatic amputation of left thumb     History reviewed. No pertinent family history.    reports that he has quit smoking. His smoking use included Cigarettes. He quit after 40 years of use. He does not have any smokeless tobacco history on file. He reports that he does not drink alcohol. His drug history not on file.   Allergies  Allergen Reactions  . Fortaz (Ceftazidime) Itching    Prior to Admission medications   Medication Sig Start  Date End Date Taking? Authorizing Provider  aspirin 81 MG tablet Take 81 mg by mouth daily.     Yes Historical Provider, MD  B Complex-C-Folic Acid (RENA-VITE PO) Take 1 tablet by mouth daily.     Yes Historical Provider, MD  carvedilol (COREG) 12.5 MG tablet Take 12.5 mg by mouth 2 (two) times daily with a meal.     Yes Historical Provider, MD  chlorproMAZINE (THORAZINE) 25 MG tablet Take 25 mg by mouth as needed.     Yes Historical Provider, MD  finasteride (PROSCAR) 5 MG tablet Take 5 mg by mouth daily.     Yes Historical Provider, MD  isosorbide mononitrate (IMDUR) 30 MG 24 hr tablet Take 30 mg by mouth daily.     Yes Historical Provider, MD  omeprazole (PRILOSEC) 40 MG capsule Take 40 mg by mouth daily.     Yes Historical Provider, MD    Results for orders placed during the hospital encounter of 01/06/2011 (from the past 48 hour(s))  GLUCOSE, CAPILLARY     Status: Abnormal   Collection Time   01-06-11 12:52 PM      Component Value Range Comment   Glucose-Capillary 108 (*) 70 - 99 (mg/dL)   PROTIME-INR     Status: Abnormal   Collection Time   01-06-11 12:57 PM  Component Value Range Comment   Prothrombin Time 20.4 (*) 11.6 - 15.2 (seconds)    INR 1.71 (*) 0.00 - 1.49    APTT     Status: Abnormal   Collection Time   11/26/2010 12:57 PM      Component Value Range Comment   aPTT 38 (*) 24 - 37 (seconds)   CBC     Status: Abnormal   Collection Time   12/12/2010 12:57 PM      Component Value Range Comment   WBC 8.5  4.0 - 10.5 (K/uL)    RBC 2.46 (*) 4.22 - 5.81 (MIL/uL)    Hemoglobin 7.2 (*) 13.0 - 17.0 (g/dL)    HCT 84.1 (*) 32.4 - 52.0 (%)    MCV 91.1  78.0 - 100.0 (fL)    MCH 29.3  26.0 - 34.0 (pg)    MCHC 32.1  30.0 - 36.0 (g/dL)    RDW 40.1 (*) 02.7 - 15.5 (%)    Platelets 93 (*) 150 - 400 (K/uL) PLATELET COUNT CONFIRMED BY SMEAR  DIFFERENTIAL     Status: Normal   Collection Time   12/15/2010 12:57 PM      Component Value Range Comment   Neutrophils Relative 50  43 - 77  (%)    Lymphocytes Relative 40  12 - 46 (%)    Monocytes Relative 5  3 - 12 (%)    Eosinophils Relative 4  0 - 5 (%)    Basophils Relative 1  0 - 1 (%)    Neutro Abs 4.3  1.7 - 7.7 (K/uL)    Lymphs Abs 3.4  0.7 - 4.0 (K/uL)    Monocytes Absolute 0.4  0.1 - 1.0 (K/uL)    Eosinophils Absolute 0.3  0.0 - 0.7 (K/uL)    Basophils Absolute 0.1  0.0 - 0.1 (K/uL)    RBC Morphology POLYCHROMASIA PRESENT      Smear Review LARGE PLATELETS PRESENT     POCT I-STAT TROPONIN I     Status: Abnormal   Collection Time   12/15/2010 12:59 PM      Component Value Range Comment   Troponin i, poc 0.15 (*) 0.00 - 0.08 (ng/mL)    Comment NOTIFIED PHYSICIAN      Comment 3            BASIC METABOLIC PANEL     Status: Abnormal   Collection Time   11/26/2010  1:01 PM      Component Value Range Comment   Sodium 139  135 - 145 (mEq/L)    Potassium 2.6 (*) 3.5 - 5.1 (mEq/L)    Chloride 106  96 - 112 (mEq/L)    CO2 16 (*) 19 - 32 (mEq/L)    Glucose, Bld 180 (*) 70 - 99 (mg/dL)    BUN 7  6 - 23 (mg/dL)    Creatinine, Ser 2.53 (*) 0.50 - 1.35 (mg/dL)    Calcium 5.9 (*) 8.4 - 10.5 (mg/dL)    GFR calc non Af Amer 27 (*) >90 (mL/min)    GFR calc Af Amer 31 (*) >90 (mL/min)   POCT I-STAT 3, BLOOD GAS (G3+)     Status: Abnormal   Collection Time   11/20/2010  1:21 PM      Component Value Range Comment   pH, Arterial 7.213 (*) 7.350 - 7.450     pCO2 arterial 61.7 (*) 35.0 - 45.0 (mmHg)    pO2, Arterial 364.0 (*) 80.0 - 100.0 (mmHg)  Bicarbonate 24.8 (*) 20.0 - 24.0 (mEq/L)    TCO2 27  0 - 100 (mmol/L)    O2 Saturation 100.0      Acid-base deficit 3.0 (*) 0.0 - 2.0 (mmol/L)    Patient temperature 98.6 F      Collection site ARTERIAL LINE      Sample type ARTERIAL      Comment NOTIFIED PHYSICIAN     PROTIME-INR     Status: Abnormal   Collection Time   11/29/2010  4:00 PM      Component Value Range Comment   Prothrombin Time 19.3 (*) 11.6 - 15.2 (seconds)    INR 1.60 (*) 0.00 - 1.49    APTT     Status: Normal    Collection Time   11/22/2010  4:00 PM      Component Value Range Comment   aPTT 37  24 - 37 (seconds)   GLUCOSE, CAPILLARY     Status: Abnormal   Collection Time   11/28/2010  4:19 PM      Component Value Range Comment   Glucose-Capillary 124 (*) 70 - 99 (mg/dL)   POCT I-STAT, CHEM 8     Status: Abnormal   Collection Time   12/15/2010  4:21 PM      Component Value Range Comment   Sodium 139  135 - 145 (mEq/L)    Potassium 2.6 (*) 3.5 - 5.1 (mEq/L)    Chloride 99  96 - 112 (mEq/L)    BUN 11  6 - 23 (mg/dL)    Creatinine, Ser 4.54 (*) 0.50 - 1.35 (mg/dL)    Glucose, Bld 098 (*) 70 - 99 (mg/dL)    Calcium, Ion 1.19 (*) 1.12 - 1.32 (mmol/L)    TCO2 25  0 - 100 (mmol/L)    Hemoglobin 9.5 (*) 13.0 - 17.0 (g/dL)    HCT 14.7 (*) 82.9 - 52.0 (%)   MRSA PCR SCREENING     Status: Normal   Collection Time   12/18/2010  4:36 PM      Component Value Range Comment   MRSA by PCR NEGATIVE  NEGATIVE    GLUCOSE, CAPILLARY     Status: Abnormal   Collection Time   11/21/2010  5:04 PM      Component Value Range Comment   Glucose-Capillary 114 (*) 70 - 99 (mg/dL)   CARDIAC PANEL(CRET KIN+CKTOT+MB+TROPI)     Status: Abnormal   Collection Time   12/18/2010  6:00 PM      Component Value Range Comment   Total CK 133  7 - 232 (U/L)    CK, MB 6.0 (*) 0.3 - 4.0 (ng/mL)    Troponin I 0.98 (*) <0.30 (ng/mL)    Relative Index 4.5 (*) 0.0 - 2.5    POCT I-STAT, CHEM 8     Status: Abnormal   Collection Time   11/21/2010  6:05 PM      Component Value Range Comment   Sodium 140  135 - 145 (mEq/L)    Potassium 2.3 (*) 3.5 - 5.1 (mEq/L)    Chloride 99  96 - 112 (mEq/L)    BUN 11  6 - 23 (mg/dL)    Creatinine, Ser 5.62 (*) 0.50 - 1.35 (mg/dL)    Glucose, Bld 130 (*) 70 - 99 (mg/dL)    Calcium, Ion 8.65 (*) 1.12 - 1.32 (mmol/L)    TCO2 25  0 - 100 (mmol/L)    Hemoglobin 9.2 (*) 13.0 - 17.0 (g/dL)  HCT 27.0 (*) 39.0 - 52.0 (%)   GLUCOSE, CAPILLARY     Status: Abnormal   Collection Time   12-29-2010  6:57 PM       Component Value Range Comment   Glucose-Capillary 102 (*) 70 - 99 (mg/dL)   POCT I-STAT 3, BLOOD GAS (G3+)     Status: Abnormal   Collection Time   12/29/10  6:59 PM      Component Value Range Comment   pH, Arterial 7.664 (*) 7.350 - 7.450     pCO2 arterial 23.2 (*) 35.0 - 45.0 (mmHg)    pO2, Arterial 86.0  80.0 - 100.0 (mmHg)    Bicarbonate 27.0 (*) 20.0 - 24.0 (mEq/L)    TCO2 28  0 - 100 (mmol/L)    O2 Saturation 99.0      Acid-Base Excess 6.0 (*) 0.0 - 2.0 (mmol/L)    Patient temperature 33.9 C      Sample type ARTERIAL      Comment MD NOTIFIED, REPEAT TEST     BASIC METABOLIC PANEL     Status: Abnormal   Collection Time   2010/12/29  8:00 PM      Component Value Range Comment   Sodium 136  135 - 145 (mEq/L)    Potassium 2.2 (*) 3.5 - 5.1 (mEq/L)    Chloride 99  96 - 112 (mEq/L)    CO2 26  19 - 32 (mEq/L)    Glucose, Bld 106 (*) 70 - 99 (mg/dL)    BUN 13  6 - 23 (mg/dL)    Creatinine, Ser 1.61 (*) 0.50 - 1.35 (mg/dL)    Calcium 8.0 (*) 8.4 - 10.5 (mg/dL)    GFR calc non Af Amer 17 (*) >90 (mL/min)    GFR calc Af Amer 20 (*) >90 (mL/min)   GLUCOSE, CAPILLARY     Status: Normal   Collection Time   Dec 29, 2010  8:00 PM      Component Value Range Comment   Glucose-Capillary 96  70 - 99 (mg/dL)   POCT I-STAT 3, BLOOD GAS (G3+)     Status: Abnormal   Collection Time   2010/12/29  9:40 PM      Component Value Range Comment   pH, Arterial 7.567 (*) 7.350 - 7.450     pCO2 arterial 25.3 (*) 35.0 - 45.0 (mmHg)    pO2, Arterial 91.0  80.0 - 100.0 (mmHg)    Bicarbonate 23.9  20.0 - 24.0 (mEq/L)    TCO2 25  0 - 100 (mmol/L)    O2 Saturation 99.0      Acid-Base Excess 1.0  0.0 - 2.0 (mmol/L)    Patient temperature 32.5 C      Collection site ARTERIAL LINE      Drawn by RT      Sample type ARTERIAL     GLUCOSE, CAPILLARY     Status: Abnormal   Collection Time   Dec 29, 2010  9:56 PM      Component Value Range Comment   Glucose-Capillary 100 (*) 70 - 99 (mg/dL)   GLUCOSE, CAPILLARY      Status: Normal   Collection Time   2010-12-29 10:59 PM      Component Value Range Comment   Glucose-Capillary 87  70 - 99 (mg/dL)   GLUCOSE, CAPILLARY     Status: Abnormal   Collection Time   2010/12/29 11:55 PM      Component Value Range Comment   Glucose-Capillary 103 (*) 70 - 99 (mg/dL)  CARDIAC PANEL(CRET KIN+CKTOT+MB+TROPI)     Status: Abnormal   Collection Time   12/14/10 12:00 AM      Component Value Range Comment   Total CK 79  7 - 232 (U/L)    CK, MB 6.5 (*) 0.3 - 4.0 (ng/mL)    Troponin I 0.90 (*) <0.30 (ng/mL)    Relative Index RELATIVE INDEX IS INVALID  0.0 - 2.5    PROTIME-INR     Status: Abnormal   Collection Time   12/14/10 12:01 AM      Component Value Range Comment   Prothrombin Time 22.2 (*) 11.6 - 15.2 (seconds)    INR 1.91 (*) 0.00 - 1.49    APTT     Status: Abnormal   Collection Time   12/14/10 12:01 AM      Component Value Range Comment   aPTT 50 (*) 24 - 37 (seconds)   BASIC METABOLIC PANEL     Status: Abnormal   Collection Time   12/14/10  1:00 AM      Component Value Range Comment   Sodium 143  135 - 145 (mEq/L) DELTA CHECK NOTED   Potassium 2.2 (*) 3.5 - 5.1 (mEq/L)    Chloride 118 (*) 96 - 112 (mEq/L) DELTA CHECK NOTED   CO2 16 (*) 19 - 32 (mEq/L)    Glucose, Bld 76  70 - 99 (mg/dL)    BUN 10  6 - 23 (mg/dL)    Creatinine, Ser 4.54 (*) 0.50 - 1.35 (mg/dL) DELTA CHECK NOTED   Calcium 4.7 (*) 8.4 - 10.5 (mg/dL)    GFR calc non Af Amer 29 (*) >90 (mL/min)    GFR calc Af Amer 34 (*) >90 (mL/min)   GLUCOSE, CAPILLARY     Status: Normal   Collection Time   12/14/10  1:21 AM      Component Value Range Comment   Glucose-Capillary 73  70 - 99 (mg/dL)   POCT I-STAT 3, BLOOD GAS (G3+)     Status: Abnormal   Collection Time   12/14/10  1:34 AM      Component Value Range Comment   pH, Arterial 7.524 (*) 7.350 - 7.450     pCO2 arterial 31.1 (*) 35.0 - 45.0 (mmHg)    pO2, Arterial 77.0 (*) 80.0 - 100.0 (mmHg)    Bicarbonate 26.8 (*) 20.0 - 24.0 (mEq/L)     TCO2 28  0 - 100 (mmol/L)    O2 Saturation 98.0      Acid-Base Excess 3.0 (*) 0.0 - 2.0 (mmol/L)    Patient temperature 32.0 C      Collection site ARTERIAL LINE      Drawn by RT      Sample type ARTERIAL     GLUCOSE, CAPILLARY     Status: Normal   Collection Time   12/14/10  2:19 AM      Component Value Range Comment   Glucose-Capillary 90  70 - 99 (mg/dL)   GLUCOSE, CAPILLARY     Status: Normal   Collection Time   12/14/10  3:47 AM      Component Value Range Comment   Glucose-Capillary 97  70 - 99 (mg/dL)   CARDIAC PANEL(CRET KIN+CKTOT+MB+TROPI)     Status: Abnormal   Collection Time   12/14/10  5:00 AM      Component Value Range Comment   Total CK 77  7 - 232 (U/L)    CK, MB 7.3 (*) 0.3 - 4.0 (ng/mL) CRITICAL VALUE  NOTED.  VALUE IS CONSISTENT WITH PREVIOUSLY REPORTED AND CALLED VALUE.   Troponin I 0.75 (*) <0.30 (ng/mL)    Relative Index RELATIVE INDEX IS INVALID  0.0 - 2.5    GLUCOSE, CAPILLARY     Status: Abnormal   Collection Time   12/14/10  5:54 AM      Component Value Range Comment   Glucose-Capillary 69 (*) 70 - 99 (mg/dL)   GLUCOSE, CAPILLARY     Status: Normal   Collection Time   12/14/10  7:08 AM      Component Value Range Comment   Glucose-Capillary 80  70 - 99 (mg/dL)   GLUCOSE, CAPILLARY     Status: Abnormal   Collection Time   12/14/10  7:52 AM      Component Value Range Comment   Glucose-Capillary 61 (*) 70 - 99 (mg/dL)   GLUCOSE, CAPILLARY     Status: Normal   Collection Time   12/14/10  9:07 AM      Component Value Range Comment   Glucose-Capillary 80  70 - 99 (mg/dL)   BASIC METABOLIC PANEL     Status: Abnormal   Collection Time   12/14/10 10:00 AM      Component Value Range Comment   Sodium 143  135 - 145 (mEq/L)    Potassium 2.6 (*) 3.5 - 5.1 (mEq/L)    Chloride 120 (*) 96 - 112 (mEq/L)    CO2 15 (*) 19 - 32 (mEq/L)    Glucose, Bld 75  70 - 99 (mg/dL)    BUN 10  6 - 23 (mg/dL)    Creatinine, Ser 3.87 (*) 0.50 - 1.35 (mg/dL)    Calcium  4.5 (*) 8.4 - 10.5 (mg/dL)    GFR calc non Af Amer 29 (*) >90 (mL/min)    GFR calc Af Amer 34 (*) >90 (mL/min)    EKG: normal EKG, normal sinus rhythm, unchanged from previous tracings, LBBB.  ROS: Intubated, not able to give  Physical Exam: Filed Vitals:   12/14/10 1115  BP:   Pulse: 58  Temp:   Resp: 11     General:elderly thin bm male intubated   Heart:RRR Lungs:Intubated.with bilateral air entry Abdomen:bs pos. Cooling pad  Not removed Extremities:no pedal edema Neuro:intubated, sedated Dialysis Access:rt. ij permacath  Assessment/Plan: 1. SP Cardiac arrest ,with AED firing,with h/o Cardiomyopathy/ systolic chf= per ccm 2. Hypokalemia= iv k replacement 2. ESRD -  tths nl hd schedule 3 Hypertension/volume  - ccm management, hd yest. 4. Anemia  - will give max epo, continue wkly iron on hd 5. Metabolic bone disease -  No op binder, zemplar on hd,now hypocalcemic,  Recheck cmet to have alb . Level will dw DR. Hyman Hopes  I have seen and examined this patient and agree with the plan of care, status post syncope and arrest. No indications for dialysis today. Plan HD in AM [facility] 12/14/2010, 2:44 PM    Lenny Pastel, PA-C [employer] [phone] 12/14/2010, 12:02 PM

## 2010-12-14 NOTE — Progress Notes (Signed)
Name: Jackson [name] MRN: [id number] DOB: Apr 09, 1928  DOS:   01-03-11     CRITICAL CARE MEDICINE PROGRESS NOTE  Referring Physician  Gavin Pound. Ghim  Reason For Consult  Status post cardiac arrest  Patient description: 77 y/o AAM with ESRD underwent HD on morning of adm. Subsequently suffered cardiac arrest @ home. Per EMS, the patient had AED applied by fire which advised a shockable rhythm. He was shocked one time. He was intubated orally at the scene. Hypothermia protocol initiated in ED @ [facility] and PCCM asked to admit. Found to have refractory hypokalemia  Lines / Drains: 11/24  R tunneled HD catheter (preadmission) 11/24  ETT>>> 11/24  OGT>>> 11/24  L Nellie TLC>>>  Cultures / Sepsis markers: None  Antibiotics: None  Tests / Events: Jan 03, 2011  Cardiac arrest post HD, ACLS, hypothermia protocol started  SUBJ: Inutbated, sedated and paralyzed. Remains on hypothermia protocol  Physical Examination  BP 114/72  Pulse 58  Temp(Src) 92.1 F (33.4 C) (Core (Comment))  Resp 12  Ht 5\' 11"  (1.803 m)  Wt 67.5 kg (148 lb 13 oz)  BMI 20.75 kg/m2  SpO2 100%  Neuro:  Sedated, intubated, mechanically ventilated HEENT:  ETT/OGT Heart:  RRR, no M/R/G Lungs: Clear anteriorly but prolonged exp phase Abdomen:  Soft, non tender, bowel sounds present Extremities:  No edema  Labs: reviewed in detail  Imaging  Ct Head Wo Contrast  01/03/2011  *RADIOLOGY REPORT*  Clinical Data: Syncope.  Post cardiac arrest.  End-stage renal disease.  CT HEAD WITHOUT CONTRAST  Technique:  Contiguous axial images were obtained from the base of the skull through the vertex without contrast.  Comparison: 10/09/2008.  Findings: No intracranial hemorrhage.  Prominent small vessel disease type changes.  Remote right thalamic tiny infarct.  No CT evidence of large acute infarct.  Small acute infarct cannot be excluded by CT.  No intracranial mass lesion detected on this unenhanced exam.  Global atrophy  without hydrocephalus.  Vascular calcifications.  Air-fluid level right sphenoid sinus.  IMPRESSION: No intracranial hemorrhage or CT evidence of large acute infarct.  Remote small right thalamic infarct.  Prominent small vessel disease type changes.  Global atrophy.  Air-fluid level right sphenoid sinus.  Original Report Authenticated By: Fuller Canada, M.D.   Dg Chest Port 1 View  01/03/2011  *RADIOLOGY REPORT*  Clinical Data: Central line placement  PORTABLE CHEST - 1 VIEW  Comparison: Portable exam 1650 hours compared to 1325 hours  Findings: Left subclavian venous catheter, tip projects over SVC. Endotracheal tube, tip 3.5 cm above carina. Right jugular dual-lumen central venous catheter, tip projecting over SVC. Nasogastric tube extends into stomach. Normal heart size. Tortuous aorta with atherosclerotic calcification. Bilateral airspace infiltrates, slightly increased on the left since previous exam. No pneumothorax.  IMPRESSION: No pneumothorax following central line placement. Bilateral pulmonary infiltrates greater on the right, increased left lower lobe since previous exam.  Original Report Authenticated By: Lollie Marrow, M.D.   Dg Chest Portable 1 View  01-03-2011  *RADIOLOGY REPORT*  Clinical Data: Intubated, unresponsive  PORTABLE CHEST - 1 VIEW  Comparison: 11/13/2010  Findings: Endotracheal tube tip 3.5 cm above carina.  Nasogastric tube been placed at least as far as the stomach, tip not seen. Right IJ central line stable in position.  There are new alveolar opacities centrally in both lung bases and in the right upper lobe. No definite effusion although the lateral costophrenic angles are excluded.  Heart size upper limits normal for technique.  IMPRESSION: 1.  Endotracheal tube and nasogastric tube placement as above. 2.  New bibasilar right upper lobe alveolar opacities, possibly asymmetric edema versus infiltrates.  Original Report Authenticated By: Osa Craver, M.D.     Assessment and Plan: Status post cardiac arrest (ventricular fibrillation) on the background of cardiomyopathy and hypokalemia -->Complete hypothermia protocol  Ventilator dependent respiratory failure -->Cont full ventilatory support -->f/u ABG and CXR  End stage kidney disease on HD -->Nephrology following  Hypokalemia -->Cont to replete -->follow BMP  Coagulopathy, thrombocytopenia -->monitor for signs of bleeding. Follow CBC, coags  Best practices / Disposition -->ICU status under PCCM -->consult Cardiology and Nephrology -->SCDs for DVT Px -->Protonix for GI Px -->NPO  The patient is critically ill with multiple organ systems failure and requires high complexity decision making for assessment and support, frequent evaluation and titration of therapies, application of advanced monitoring technologies and extensive interpretation of multiple databases. Critical Care Time devoted to patient care services described in this note is 35 minutes.  Merwyn Katos, MD Pulmonary and Critical Care Medicine [facility] Cell: [phone]  12/14/2010, 2:12 PM

## 2010-12-14 NOTE — Progress Notes (Signed)
CRITICAL VALUE ALERT  Critical value received: ckmb  Date of notification:  12/14/2010  Time of notification:  0114  Critical value read back:yes  Nurse who received alert:  C. Renae Gloss  MD notified (1st page):  Tyson Alias  Time of first page:  0115  MD notified (2nd page):  Time of second page:  Responding MD:  Tyson Alias  Time MD responded:  [phone]

## 2010-12-15 ENCOUNTER — Inpatient Hospital Stay (HOSPITAL_COMMUNITY): Payer: Medicare Other

## 2010-12-15 ENCOUNTER — Other Ambulatory Visit: Payer: Medicare Other

## 2010-12-15 ENCOUNTER — Encounter (HOSPITAL_COMMUNITY): Payer: Self-pay | Admitting: Internal Medicine

## 2010-12-15 DIAGNOSIS — I469 Cardiac arrest, cause unspecified: Secondary | ICD-10-CM

## 2010-12-15 DIAGNOSIS — Z992 Dependence on renal dialysis: Secondary | ICD-10-CM | POA: Diagnosis present

## 2010-12-15 DIAGNOSIS — I10 Essential (primary) hypertension: Secondary | ICD-10-CM | POA: Diagnosis present

## 2010-12-15 DIAGNOSIS — E162 Hypoglycemia, unspecified: Secondary | ICD-10-CM | POA: Diagnosis not present

## 2010-12-15 DIAGNOSIS — D649 Anemia, unspecified: Secondary | ICD-10-CM | POA: Diagnosis present

## 2010-12-15 DIAGNOSIS — E876 Hypokalemia: Secondary | ICD-10-CM | POA: Diagnosis present

## 2010-12-15 DIAGNOSIS — D696 Thrombocytopenia, unspecified: Secondary | ICD-10-CM | POA: Diagnosis present

## 2010-12-15 LAB — BASIC METABOLIC PANEL
Calcium: 8 mg/dL — ABNORMAL LOW (ref 8.4–10.5)
Chloride: 106 mEq/L (ref 96–112)
Creatinine, Ser: 3.77 mg/dL — ABNORMAL HIGH (ref 0.50–1.35)
GFR calc Af Amer: 16 mL/min — ABNORMAL LOW (ref >90)
GFR calc non Af Amer: 14 mL/min — ABNORMAL LOW (ref >90)
GFR calc non Af Amer: 15 mL/min — ABNORMAL LOW (ref >90)
Glucose, Bld: 142 mg/dL — ABNORMAL HIGH (ref 70–99)
Potassium: 4.4 mEq/L (ref 3.5–5.1)
Sodium: 135 mEq/L (ref 135–145)
Sodium: 135 mEq/L (ref 135–145)

## 2010-12-15 LAB — COMPREHENSIVE METABOLIC PANEL
AST: 52 U/L — ABNORMAL HIGH (ref 0–37)
Albumin: 1.8 g/dL — ABNORMAL LOW (ref 3.5–5.2)
Alkaline Phosphatase: 131 U/L — ABNORMAL HIGH (ref 39–117)
Chloride: 105 mEq/L (ref 96–112)
Potassium: 4.6 mEq/L (ref 3.5–5.1)
Total Bilirubin: 0.5 mg/dL (ref 0.3–1.2)

## 2010-12-15 LAB — GLUCOSE, CAPILLARY
Glucose-Capillary: 110 mg/dL — ABNORMAL HIGH (ref 70–99)
Glucose-Capillary: 56 mg/dL — ABNORMAL LOW (ref 70–99)
Glucose-Capillary: 67 mg/dL — ABNORMAL LOW (ref 70–99)
Glucose-Capillary: 98 mg/dL (ref 70–99)
Glucose-Capillary: 30 mg/dL — CL (ref 70–99)
Glucose-Capillary: 92 mg/dL (ref 70–99)

## 2010-12-15 LAB — CBC
HCT: 23.2 % — ABNORMAL LOW (ref 39.0–52.0)
MCHC: 32.8 g/dL (ref 30.0–36.0)
MCV: 90.6 fL (ref 78.0–100.0)
RDW: 20.2 % — ABNORMAL HIGH (ref 11.5–15.5)

## 2010-12-15 LAB — CARDIAC PANEL(CRET KIN+CKTOT+MB+TROPI): CK, MB: 4.5 ng/mL — ABNORMAL HIGH (ref 0.3–4.0)

## 2010-12-15 LAB — APTT: aPTT: 44 seconds — ABNORMAL HIGH (ref 24–37)

## 2010-12-15 LAB — PROTIME-INR: INR: 1.71 — ABNORMAL HIGH (ref 0.00–1.49)

## 2010-12-15 MED ORDER — INSULIN ASPART 100 UNIT/ML ~~LOC~~ SOLN
0.0000 [IU] | Freq: Three times a day (TID) | SUBCUTANEOUS | Status: DC
  Filled 2010-12-15: qty 3

## 2010-12-15 MED ORDER — NEPRO/CARBSTEADY PO LIQD
1000.0000 mL | ORAL | Status: DC
  Administered 2010-12-15: 1000 mL via ORAL
  Filled 2010-12-15 (×3): qty 1000

## 2010-12-15 MED ORDER — DEXTROSE 50 % IV SOLN
INTRAVENOUS | Status: AC
  Administered 2010-12-15: 25 mL
  Filled 2010-12-15: qty 50

## 2010-12-15 MED ORDER — PANTOPRAZOLE SODIUM 40 MG PO PACK
40.0000 mg | PACK | ORAL | Status: DC
  Administered 2010-12-15 – 2010-12-29 (×14): 40 mg
  Filled 2010-12-15 (×15): qty 20

## 2010-12-15 MED ORDER — PIPERACILLIN-TAZOBACTAM IN DEX 2-0.25 GM/50ML IV SOLN
2.2500 g | Freq: Three times a day (TID) | INTRAVENOUS | Status: DC
  Administered 2010-12-15 – 2010-12-24 (×26): 2.25 g via INTRAVENOUS
  Filled 2010-12-15 (×31): qty 50

## 2010-12-15 MED ORDER — LEVALBUTEROL HCL 0.63 MG/3ML IN NEBU
0.6300 mg | INHALATION_SOLUTION | Freq: Four times a day (QID) | RESPIRATORY_TRACT | Status: DC
  Administered 2010-12-15 – 2011-01-02 (×64): 0.63 mg via RESPIRATORY_TRACT
  Filled 2010-12-15 (×78): qty 3

## 2010-12-15 MED ORDER — VANCOMYCIN HCL 1000 MG IV SOLR
1500.0000 mg | Freq: Once | INTRAVENOUS | Status: AC
  Administered 2010-12-15: 1500 mg via INTRAVENOUS
  Filled 2010-12-15 (×2): qty 1500

## 2010-12-15 MED ORDER — DEXTROSE 50 % IV SOLN
INTRAVENOUS | Status: AC
  Administered 2010-12-15: 04:00:00
  Filled 2010-12-15: qty 50

## 2010-12-15 MED ORDER — DEXTROSE 50 % IV SOLN
25.0000 mL | Freq: Once | INTRAVENOUS | Status: AC | PRN
  Administered 2010-12-15: 25 mL via INTRAVENOUS
  Filled 2010-12-15: qty 50

## 2010-12-15 MED ORDER — DEXTROSE 50 % IV SOLN
INTRAVENOUS | Status: AC
  Filled 2010-12-15: qty 50

## 2010-12-15 MED ORDER — INSULIN ASPART 100 UNIT/ML ~~LOC~~ SOLN
0.0000 [IU] | SUBCUTANEOUS | Status: DC
  Filled 2010-12-15: qty 3

## 2010-12-15 MED ORDER — PRO-STAT SUGAR FREE PO LIQD
30.0000 mL | Freq: Two times a day (BID) | ORAL | Status: DC
  Administered 2010-12-16 – 2010-12-17 (×3): 30 mL
  Filled 2010-12-15 (×5): qty 30

## 2010-12-15 NOTE — Progress Notes (Signed)
CBG:57  Treatment: 1/2 amp (25ml) D50  Symptoms: None  Follow-up CBG: [id number] CBG Result:110  Possible Reasons for Event: Inadequate meal intake  Comments/MD notified: n/a    Earl Lagos

## 2010-12-15 NOTE — Progress Notes (Signed)
INITIAL ADULT NUTRITION ASSESSMENT Date: 12/15/2010   Time: 11:55 AM Reason for Assessment: TF Consult  ASSESSMENT: Male 77 y.o.  Dx: Status post cardiac arrest, Status post hypothermia protocol  Hx:  Past Medical History  Diagnosis Date  . Chronic systolic heart failure 09/06/2009  . ESRD on hemodialysis     Tues, Thurs, Sat  . Cardiomyopathy   . HTN (hypertension)   . Hyperlipemia   . Anemia     Multifactorial - iron deficiency and anemia of chronic disease in setting of ESRD. Last anemia panel (07/2005 ) - Fe 16, TIBC  203, Ferritin 350.  Marland Kitchen Gout   . Tricuspid regurgitation   . Thrombus   . Cardiac arrest 12/09/2007    with pulseless electrical activity 2/2 severe hyperkalemia (K > 7.5)  . Thyroid disease   . COPD (chronic obstructive pulmonary disease)   . GERD (gastroesophageal reflux disease)   . Cancer     prostate  . Rhabdomyolysis     H/O - secondary to blunt trauma to the buttock   . Secondary hyperparathyroidism   . 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 Chronic idiopathic thrombocytopenia     BL platelet 100-120.    Related Meds:     . antiseptic oral rinse  1 application Mouth Rinse QID  . artificial tears  1 application Both Eyes Q8H  . chlorhexidine  15 mL Mouth/Throat BID  . dextrose      . dextrose      . dextrose      . levalbuterol  0.63 mg Nebulization QID  . pantoprazole sodium  40 mg Per Tube Q24H  . potassium chloride  10 mEq Intravenous Q1 Hr x 4  . DISCONTD: dextrose      . DISCONTD: fentaNYL  100 mcg Intravenous Once  . DISCONTD: levalbuterol  0.63 mg Nebulization Q6H  . DISCONTD: midazolam  2 mg Intravenous Once  . DISCONTD: pantoprazole (PROTONIX) IV  40 mg Intravenous Q24H     Ht: 5\' 11"  (180.3 cm)  Wt: 163 lb 2.3 oz (74 kg)  Ideal Wt: 78.2 kg % Ideal Wt: 95%  Usual Wt: unknown  Body mass index is 22.75 kg/(m^2).  Food/Nutrition Related Hx: unknown  Labs:  BMET    Component  Value Date/Time   NA 135 12/15/2010 0440   K 4.4 12/15/2010 0440   CL 106 12/15/2010 0440   CO2 20 12/15/2010 0440   GLUCOSE 142* 12/15/2010 0440   BUN 18 12/15/2010 0440   CREATININE 3.62* 12/15/2010 0440   CALCIUM 7.1* 12/15/2010 0440   GFRNONAA 15* 12/15/2010 0440   GFRAA 17* 12/15/2010 0440    CBG (last 3)   Basename 12/15/10 1142 12/15/10 0813 12/15/10 0750  GLUCAP 98 110* 56*   I/O last 3 completed shifts: In: 2588.5 [I.V.:2268.5; IV Piggyback:320] Out: 47 [Urine:47] Total I/O In: 1240.5 [I.V.:1240.5] Out: 0    Diet Order: NPO  IVF:    sodium chloride Last Rate: 20 mL/hr at 12/15/10 0800  dextrose Last Rate: 50 mL/hr at 12/15/10 0800  norepinephrine (LEVOPHED) Adult infusion Last Rate: 10 mcg/min (12/15/10 1114)  DISCONTD: cisatracurium (NIMBEX) infusion Last Rate: Stopped (12/15/10 0125)  DISCONTD: fentaNYL infusion INTRAVENOUS Last Rate: Stopped (12/15/10 0423)  DISCONTD: midazolam (VERSED) infusion Last Rate: Stopped (12/15/10 0423)    Estimated Nutritional Needs:   Kcal: 1750 Protein: 89-104 grams Fluid: 1200 ml  NUTRITION DIAGNOSIS: -Inadequate oral intake (NI-2.1).  Status: Ongoing  RELATED  TO: inability to eat  AS EVIDENCE BY: NPO status  MONITORING/EVALUATION(Goals): TF to meet 90-100% of estimated nutrition needs. Monitor for TF tolerance, weight, labs.  EDUCATION NEEDS: -No education needs identified at this time  INTERVENTION: Initiate TF with Nepro at 15 ml/h, increase by 10 ml every 4 hours to goal of 35 ml/h with Prostat 30 ml BID to provide 1656 kcals, 98 grams protein daily.  Dietitian [phone]  DOCUMENTATION CODES Per approved criteria  -Not Applicable     Hettie Holstein 12/15/2010, 11:55 AM

## 2010-12-15 NOTE — Progress Notes (Signed)
Jackson [name] is a 77 y.o. male admitted on 12/03/2010 with cardiac arrest after HD.  Had shockable rhythm per AED applied by paramedics. PMHx Cardiomyopathy with EF 30%, ESRD on HD T/Th/Sa, HTN, Hyperlipidemia, Anemia, Gout, TR  Line/tube: 11/24 R tunneled HD catheter (preadmission)  11/24 ETT>>>  11/24 OGT>>>  11/24 L [place] TLC>>> 11/25 Lt Radial [place]>>>  Cx: Sputum 11/26  Abx: Zosyn 11/26>> Vancomycin 11/26>>  Tests/events: 11/24 Cardiac arrest post HD, ACLS, hypothermia protocol started   SUBJECTIVE: Rewarmed overnight. Remains on levophed.  Tolerates some pressure support.  OBJECTIVE:  Blood pressure 121/81, pulse 88, temperature 98.9 F (37.2 C), temperature source Axillary, resp. rate 13, height 5\' 11"  (1.803 m), weight 163 lb 2.3 oz (74 kg), SpO2 100.00%.   Intake/Output Summary (Last 24 hours) at 12/15/10 1133 Last data filed at 12/15/10 1114  Gross per 24 hour  Intake 2718.26 ml  Output      7 ml  Net 2711.26 ml   Neuro: intubated, mechanically ventilated  HEENT: ETT/OGT  Heart: RRR, no M/R/G Lungs: Clear anteriorly but prolonged exp phase  Abdomen: Soft, non tender, bowel sounds present  Extremities: No edema, rt hand cool but able to doppler radial pulse   Lab Results  Component Value Date   CREATININE 3.62* 12/15/2010   BUN 18 12/15/2010   NA 135 12/15/2010   K 4.4 12/15/2010   CL 106 12/15/2010   CO2 20 12/15/2010   Lab Results  Component Value Date   WBC 9.0 12/15/2010   HGB 7.6* 12/15/2010   HCT 23.2* 12/15/2010   MCV 90.6 12/15/2010   PLT 57* 12/15/2010     ASSESSMENT/PLAN:  VDRF in setting of cardiac arrest -continue full vent support until mental status improved -f/u CXR -bronchial hygiene  Purulent bronchitis with ?PNA -send tracheal aspirate -D1/x vancomycin, zosyn  Cardiac arrest with presumed VF with hx of systolic CHF and post-dialysis hypokalemia -rewarmed 11/26 -consulted cardiology  Cardiogenic shock -wean off  levophed to keep SBP > 90  ESRD  -HD per renal -monitor electrolytes closely  Toxic metabolic encephalopathy -hold sedation and monitor neuro status for HIE   Anemia -f/u CBC -transfuse for Hb < 7  Thrombocytopenia -f/u CBC  Hypoglycemia -monitor CBG's -will have nutrition recommend/start tube feeds  Best practices / Disposition  -->SCDs for DVT Px  -->Protonix for GI Px   Critical care time 35 minutes  Jackson [name] Pager:  [phone] 12/15/2010, 11:33 AM

## 2010-12-15 NOTE — Progress Notes (Signed)
Subjective: Interval History: rewarmed during the night; has been hypoglycemic; all sedation off - not doing anything meaningful; a little posturing left hand; not moving right side; no gag with suctioning  Objective: SCHEDULED MEDS:    . antiseptic oral rinse  1 application Mouth Rinse QID  . artificial tears  1 application Both Eyes Q8H  . calcium gluconate  1 g Intravenous Once  . chlorhexidine  15 mL Mouth/Throat BID  . dextrose      . dextrose      . dextrose      . levalbuterol  0.63 mg Nebulization QID  . pantoprazole sodium  40 mg Per Tube Q24H  . potassium chloride  10 mEq Intravenous Q1 Hr x 4  . DISCONTD: dextrose      . DISCONTD: fentaNYL  100 mcg Intravenous Once  . DISCONTD: levalbuterol  0.63 mg Nebulization Q6H  . DISCONTD: midazolam  2 mg Intravenous Once  . DISCONTD: pantoprazole (PROTONIX) IV  40 mg Intravenous Q24H   Vital signs in last 24 hours:  Temp:  [90.3 F (32.4 C)-98.9 F (37.2 C)] 98.9 F (37.2 C) (11/26 0800) Pulse Rate:  [25-97] 88  (11/26 1114) Resp:  [0-21] 13  (11/26 1114) BP: (75-159)/(51-105) 121/81 mmHg (11/26 1100) SpO2:  [100 %] 100 % (11/26 1114) Arterial Line BP: (59-175)/(36-129) 112/57 mmHg (11/26 1114) FiO2 (%):  [29.9 %-40.2 %] 29.9 % (11/26 1114) Weight:  [74 kg (163 lb 2.3 oz)] 163 lb 2.3 oz (74 kg) (11/26 0600) Weight change: 6.006 kg (13 lb 3.9 oz)  Intake/Output: I/O last 3 completed shifts: In: 2588.5 [I.V.:2268.5; IV Piggyback:320] Out: 47 [Urine:47]  Intake/Output this shift:  Total I/O In: 1240.5 [I.V.:1240.5] Out: 0   EXAM: intubated, not sedated, breathing over the vent; has foley in CVS-Regular; perm cath RS- Bilateral rhonchi ABD-Not distended EXT- SCD's on No edema  Lab Results:  Basename 12/15/10 0400 11/25/2010 1805 11/29/2010 1621 11/26/2010 1257  WBC 9.0 -- -- 8.5  HGB 7.6* 9.2* 9.5* --  HCT 23.2* 27.0* 28.0* --  PLT 57* -- -- 93*   BMET  Basename 12/15/10 0440 12/15/10 0400 12/15/10  NA 135 134*  135  K 4.4 4.6 5.0  CL 106 105 104  CO2 20 19 22   GLUCOSE 142* 202* 108*  BUN 18 19 18   CREATININE 3.62* 3.66* 3.77*  CALCIUM 7.1* 7.3* 8.0*  PHOS -- -- --   LFT  Basename 12/15/10 0400  PROT 5.2*  ALBUMIN 1.8*  AST 52*  ALT 36  ALKPHOS 131*  BILITOT 0.5  BILIDIR --  IBILI --   PT/INR  Basename 12/15/10 0400 12/14/10 0001  LABPROT 20.4* 22.2*  INR 1.71* 1.91*   Hepatitis Panel No results found for this basename: HEPBSAG,HCVAB,HEPAIGM,HEPBIGM in the last 72 hours PTH: Lab Results  Component Value Date   CALCIUM 7.1* 12/15/2010   CAION 0.94* 12/17/2010   PHOS 2.5 12/25/2009    Studies/Results: Ct Head Wo Contrast  11/26/2010  *RADIOLOGY REPORT*  Clinical Data: Syncope.  Post cardiac arrest.  End-stage renal disease.  CT HEAD WITHOUT CONTRAST  Technique:  Contiguous axial images were obtained from the base of the skull through the vertex without contrast.  Comparison: 10/09/2008.  Findings: No intracranial hemorrhage.  Prominent small vessel disease type changes.  Remote right thalamic tiny infarct.  No CT evidence of large acute infarct.  Small acute infarct cannot be excluded by CT.  No intracranial mass lesion detected on this unenhanced exam.  Global atrophy without  hydrocephalus.  Vascular calcifications.  Air-fluid level right sphenoid sinus.  IMPRESSION: No intracranial hemorrhage or CT evidence of large acute infarct.  Remote small right thalamic infarct.  Prominent small vessel disease type changes.  Global atrophy.  Air-fluid level right sphenoid sinus.  Original Report Authenticated By: Fuller Canada, M.D.   Dg Chest Port 1 View  12/15/2010  *RADIOLOGY REPORT*  Clinical Data: Respiratory failure.  PORTABLE CHEST - 1 VIEW  Comparison: Chest x-ray 12-27-10.  Findings: The endotracheal tube is 13 mm above the carina and should be retracted 2 cm. The remaining support apparatus is stable.  The cardiac silhouette, mediastinal and hilar contours are stable.  The  lungs demonstrate slight improved aeration with resolving edema and atelectasis.  IMPRESSION:  1.  The endotracheal tube is 13 mm above the carina and should be retracted 2 cm. 2.  The remaining support apparatus is stable. 3.  Slight improved lung aeration with resolving edema and atelectasis or infiltrates.  Original Report Authenticated By: P. Loralie Champagne, M.D.   Dg Chest Port 1 View  December 27, 2010  *RADIOLOGY REPORT*  Clinical Data: Central line placement  PORTABLE CHEST - 1 VIEW  Comparison: Portable exam 1650 hours compared to 1325 hours  Findings: Left subclavian venous catheter, tip projects over SVC. Endotracheal tube, tip 3.5 cm above carina. Right jugular dual-lumen central venous catheter, tip projecting over SVC. Nasogastric tube extends into stomach. Normal heart size. Tortuous aorta with atherosclerotic calcification. Bilateral airspace infiltrates, slightly increased on the left since previous exam. No pneumothorax.  IMPRESSION: No pneumothorax following central line placement. Bilateral pulmonary infiltrates greater on the right, increased left lower lobe since previous exam.  Original Report Authenticated By: Lollie Marrow, M.D.   Dg Chest Portable 1 View  12/27/2010  *RADIOLOGY REPORT*  Clinical Data: Intubated, unresponsive  PORTABLE CHEST - 1 VIEW  Comparison: 11/13/2010  Findings: Endotracheal tube tip 3.5 cm above carina.  Nasogastric tube been placed at least as far as the stomach, tip not seen. Right IJ central line stable in position.  There are new alveolar opacities centrally in both lung bases and in the right upper lobe. No definite effusion although the lateral costophrenic angles are excluded.  Heart size upper limits normal for technique.  IMPRESSION: 1.  Endotracheal tube and nasogastric tube placement as above. 2.  New bibasilar right upper lobe alveolar opacities, possibly asymmetric edema versus infiltrates.  Original Report Authenticated By: Osa Craver, M.D.      Assessment/Plan: 1. SP Cardiac arrest - post cooling protocol; sedatives all off, not waking up; CCM managing; remains hypotensive on Levophed 2. Hypokalemia= repleted; will need to watch for hyperkalemia as he warms (and not overreplete)  3. ESRD - tths nl hd schedule; I have not written orders yet for tomorrow - if he is still full measure of support tomorrow, and is still hypotensive requiring pressors, may require CVVHD; Last dialysis was Saturday  4. Anemia - will give max epo, continue wkly iron on hd  5. Metabolic bone disease - No op binder, zemplar on hd,   LOS: 2 Nazanin Kinner B @TODAY @11 :50 AM

## 2010-12-15 NOTE — Progress Notes (Signed)
eLink Physician-Brief Progress Note Patient Name: Jackson [name] DOB: 01-31-1928 MRN: [id number]  Date of Service  12/15/2010   HPI/Events of Note  Hypoglycemia inspite of D10 & TFs   eICU Interventions  Increase D10 to 100/h, monitor Na   Intervention Category  Minor Interventions: Routine modifications to care plan (e.g. PRN medications for pain, fever)  Gerardo Territo V. 12/15/2010, 10:13 PM

## 2010-12-15 NOTE — Consult Note (Signed)
CONSULT NOTE  Date: 12/15/2010                  Patient Name:  Jackson [name]  MRN: [id number]  DOB: 03/24/1928  Age / Sex: 77 y.o., male         PCP: Ralene Ok, MD, MD  Primary Cardiologist: Tonny Bollman, MD              Service Requesting Consult: PCCM                 Reason for Consult: Cardiac Arrest              History of Present Illness: Patient is a 77 y.o. male with a PMHx of systolic congestive heart failure (EF 35-40%), ESRD on HD, chronic thrombocytopenia, who presented to [facility] on 12/01/2010 for evaluation of witnessed syncopal episode that occurred on the morning of admission following his dialysis session. Pt's daughter, who witnessed the fall, apparently initiated CPR immediately, and time between EMS arrival and syncopal episode was thought to be minimal, although no records to confirm. On EMS arrival to the scene, pt was found to be in a cardiopulmonary arrest (ventricular fibrillation per EMS report), thereby ACLS protocol was initiated and the pt received a shock, epi, and required intubation at the scene, with return of cardiopulmonary function. On admission to [facility], hypothermia protocol was initiated on 11/24, rewarming began 11/25 PM with goal temperature reached at 4:30AM on 11/26.  At the time of evaluation, the pt remains intubated, has no purposeful movements and nonverbal, therefore, further details cannot be obtained, except from chart review.   Medications: Outpatient medications: Medication Sig  . aspirin 81 MG tablet Take 81 mg by mouth daily.   . B Complex-C-Folic Acid (RENA-VITE PO) Take 1 tablet by mouth daily.    . carvedilol (COREG) 12.5 MG tablet Take 12.5 mg by mouth 2 (two) times daily with a meal.    . chlorproMAZINE (THORAZINE) 25 MG tablet Take 25 mg by mouth as needed.    . finasteride (PROSCAR) 5 MG tablet Take 5 mg by mouth daily.    . isosorbide mononitrate (IMDUR) 30 MG 24 hr tablet Take 30 mg by mouth daily.    Marland Kitchen omeprazole  (PRILOSEC) 40 MG capsule Take 40 mg by mouth daily.      Current medications: Medication Dose Route Frequency  . 0.9 %  sodium chloride infusion   Intravenous Continuous  . antiseptic oral rinse (BIOTENE) solution 15 mL  1 application Mouth Rinse QID  . artificial tears (LACRILUBE) ophthalmic ointment 1 application  1 application Both Eyes Q8H  . calcium gluconate 1 g in sodium chloride 0.9 % 100 mL IVPB  1 g Intravenous Once  . chlorhexidine (PERIDEX) 0.12 % solution 15 mL  15 mL Mouth/Throat BID  . dextrose 10 % infusion   Intravenous Continuous  . dextrose 50 % solution      . dextrose 50 % solution      . dextrose 50 % solution      . fentaNYL (SUBLIMAZE) 10 mcg/mL in sodium chloride 0.9 % 250 mL infusion  25-300 mcg/ hr Intravenous Continuous  . fentaNYL (SUBLIMAZE) bolus via infusion 50 mcg  50 mcg Intravenous Q15 min PRN  . fentaNYL (SUBLIMAZE) injection 100 mcg  100 mcg Intravenous Once  . levalbuterol (XOPENEX) nebulizer solution 0.63 mg  0.63 mg Nebulization Q3H PRN  . levalbuterol (XOPENEX) nebulizer solution 0.63 mg  0.63 mg Nebulization QID  .  midazolam (VERSED) 1 mg/mL bolus via infusion 2 mg  2 mg Intravenous Q30 min PRN  . midazolam (VERSED) 1 mg/mL in sodium chloride 0.9 % 50 mL infusion  1-10 mg/hr Intravenous Continuous  . midazolam (VERSED) injection 2 mg  2 mg Intravenous Once  . norepinephrine (LEVOPHED) 16 mg in dextrose 5 % 250 mL infusion  2-50 mcg/min Intravenous Continuous  . pantoprazole (PROTONIX) injection 40 mg  40 mg Intravenous Q24H  . potassium chloride 10 mEq in 50 mL *CENTRAL LINE* IVPB  10 mEq Intravenous Q1 Hr x 4     Allergies: Allergen Reactions  . Fortaz (Ceftazidime) Itching     Past Medical History: Diagnosis Date  . Chronic systolic heart failure - EF 35-40% (Echo 10/2009) 09/06/2009  . ESRD on hemodialysis     Tues, Thurs, Sat  . Cardiomyopathy   . HTN (hypertension)   . Hyperlipemia   . Anemia     Multifactorial - iron deficiency  and anemia of chronic disease in setting of ESRD. Last anemia panel (07/2005 ) - Fe 16, TIBC  203, Ferritin 350.  Marland Kitchen Gout   . Tricuspid regurgitation   . Thrombus, apical   . Cardiac arrest 12/09/2007    with pulseless electrical activity 2/2 severe hyperkalemia (K > 7.5)  . Thyroid disease   . COPD (chronic obstructive pulmonary disease)   . GERD (gastroesophageal reflux disease)   . Cancer     prostate  . Rhabdomyolysis     H/O - secondary to blunt trauma to the buttock   . Secondary hyperparathyroidism   . 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 Chronic idiopathic thrombocytopenia     BL platelet 100-120.    Past Surgical History: Procedure Date  . Hernia repair   . Av fistula placement   . Jugular catheter placement   . Amputation 1977    traumatic amputation of left thumb    Family History: History reviewed. No pertinent family history.   Social History: Social History  . Marital Status: Widowed    Number of Children: 5   Social History Main Topics  . Smoking status: Former Smoker -- quit 40 years ago    Types: Cigarettes  . Alcohol Use: No    Review of Systems: Full review of systems unable to be obtained secondary to encephalopathic state.  Vital Signs: Blood pressure 121/81, pulse 88, temperature 98.9 F (37.2 C), temperature source Axillary, resp. rate 13, height 5\' 11"  (1.803 m), weight 163 lb 2.3 oz (74 kg), SpO2 100.00%.   Physical Exam: General: Vital signs reviewed and noted. Intubated, not responding to commands or painful stimuli. No purposeful movements.  Head: Normocephalic, atraumatic.  Eyes: Pupils fixed and constricted. No signs of anemia or jaundince.  Nose: Mucous membranes moist, not inflammed, nonerythematous.  Throat: Oropharynx nonerythematous, mildly dry.   Neck: No deformities, masses, or tenderness noted.Supple, No carotid Bruits, no JVD.  Lungs:  Normal respiratory effort. Basilar crackles.  Prolonged expiration  Heart: RRR. S1 and S2 normal without gallop, murmur, or rubs.  Abdomen:  BS normoactive. Soft, Nondistended, non-tender.  No masses or organomegaly.  Extremities: No pretibial edema.    Lab results: Basic Metabolic Panel:  Lab 12/15/10 3086 12/15/10 0400 12/15/10  NA 135 134* 135  K 4.4 4.6 5.0  CL 106 105 104  CO2 20 19 22   GLUCOSE 142* 202* 108*  BUN 18 19 18   CREATININE 3.62* 3.66* 3.77*  CALCIUM  7.1* 7.3* 8.0*  MG -- -- --  PHOS -- -- --    Liver Function Tests:  Lab 12/15/10 0400 01-06-11 1600  AST 52* 56*  ALT 36 31  ALKPHOS 131* 113  BILITOT 0.5 0.6  PROT 5.2* 4.1*  ALBUMIN 1.8* 1.5*    CBC:  Lab 12/15/10 0400 11/25/2010 1805 11/25/2010 1621 12/10/2010 1257  WBC 9.0 -- -- 8.5  NEUTROABS -- -- -- 4.3  HGB 7.6* 9.2* 9.5* --  HCT 23.2* 27.0* 28.0* --  MCV 90.6 -- -- 91.1  PLT 57* -- -- 93*    Cardiac Enzymes:  Lab 12/15/10 0654 January 06, 2011 0500 06-Jan-2011 12/14/2010 1800  CKTOTAL 29 77 79 133  CKMB 4.5* 7.3* 6.5* 6.0*  CKMBINDEX -- -- -- --  TROPONINI 0.35* 0.75* 0.90* 0.98*    CBG:  Lab 12/15/10 0750 12/15/10 0748 12/15/10 0744 12/15/10 0455 12/15/10 0437  GLUCAP 56* 67* <10* 227* <10*    Microbiology: Results for orders placed during the hospital encounter of 12/01/2010  MRSA PCR SCREENING     Status: Normal   Collection Time   12/16/2010  4:36 PM      Component Value Range Status Comment   MRSA by PCR NEGATIVE  NEGATIVE  Final     Coagulation Studies:  Basename 12/15/10 0400 01/06/2011 0001 12/12/2010 1600  LABPROT 20.4* 22.2* 19.3*  INR 1.71* 1.91* 1.60*    Imaging: Ct Head Wo Contrast (11/20/2010) -  No intracranial hemorrhage or CT evidence of large acute infarct.  Remote small right thalamic infarct.  Prominent small vessel disease type changes.  Global atrophy.  Air-fluid level right sphenoid sinus.   Dg Chest Port 1 View (12/15/2010) - 1.  The endotracheal tube is 13 mm above the carina and should be retracted 2 cm. 2.  The  remaining support apparatus is stable. 3.  Slight improved lung aeration with resolving edema and atelectasis or infiltrates.   Dg Chest Port 1 View (11/30/2010) - No pneumothorax following central line placement. Bilateral pulmonary infiltrates greater on the right, increased left lower lobe since previous exam.    Dg Chest Portable 1 View (12/11/2010)  - 1.  Endotracheal tube and nasogastric tube placement as above. 2.  New bibasilar right upper lobe alveolar opacities, possibly asymmetric edema versus infiltrates.      Last Echo (11/03/2009): Diffuse hypokinesis possibly worse in the inferior Solstice Lastinger. LV cavity moderately dilated. Mild LVH. Moderate reduction of systolic function (LV EF 35-40%). Mild MR. Mild LA dilation. PA peak pressure 37 mmHg.   Last Cath: Unknown   Assessment & Plan:  Pt is a 77 y.o. yo male with a PMHX of ESRD on HD, systolic congestive heart failure, chronic thrombocytopenia, mixed anemia, who presented to [facility] on 12/12/2010 with after sustaining witnessed cardiopulmonary (v.fib) arrest after dialysis session, requiring intubation on field. Was admitted by [facility] service, and artic sun protocol initiated 12/19/2010, rewarming initiated 01/06/2023. Pt remains without purposeful movements, not arousable to even painful stimuli. Cardiology consulted for cardiac evaluation in setting of cardiopulmonary arrest.   Cardiopulmonary arrest - the cause of this patient's cardiopulmonary arrest is not completely evident at this time. Likely precipitated by marked hypokalemia-induced arrythmia, with admission potassium notably 2.6, and ventricular fibrillation noted per EMS report. May also be more prone to ventricular arrhythmias at baseline given his significant cardiomyopathy. Coronary heart disease in this pt with cardiac risk factors including hypertension, hyperlipidemia, remote tobacco abuse, ESRD, and obesity cannot be ruled out, as well, there was mild  troponin elevation on admission  (which has been slowly downtrending 0.98 --> 0.35) This likely reflects resuscitative efforts.  Once he has completed artic sun protocol, consider starting low-dose statin, beta blocker as tolerated by blood pressure.  Can consider further invasive cardiac evaluation after further family discussion regarding goals of care.    ESRD (end stage renal disease) on dialysis - Tues, Thurs, Saturday schedule.   Per renal service   Hypokalemia - pt had significant hypokalemia on admission, with refractory hypokalemia during cooling protocol. Has since been repleted appropriately.  Continue to monitor electrolytes, with goal K around 4, goal Mg around 2.   Check Mg.   Careful with potassium repletion in setting of ESRD   Anemia, mixed - history of iron deficiency and anemia of chronic disease in setting of ESRD - pt has notably been having worsening of his anemia since 10/2010, requiring increased doses of Epogen.  Per renal service  Continue Epogen, iron.   Acute on chronic thrombocytopenia - pt has chronic thrombocytopenia with baseline platelets of low 100s. Current platelets of 57. No specific source of blood loss is identified at this time. No specific drugs to cause acute thrombocytopenia.  Consider FOBT.   Hypertension - currently not on home meds in setting of artic sun protocol. Was recently recommended to dc Imdur in setting of frequent syncopal episodes, thought to possibly exacerbate his hypotensive episodes.  Continue to monitor once off of artic sun protocol.  Consider to resume only his home carvedilol once taking oral medications.    Hyperlipidemia - not currently on statin therapy.  Consider FLP - if statin indicated, avoid simvastatin in setting of thrombocytopenia   DVT PPX - SCD    This patient's case and plan of care was discussed with and reviewed by Valera Castle, M.D.   Johnette Abraham, D.OGlenna Durand, Internal Medicine Resident 12/15/2010, 11:29  AM    I have taken a history, reviewed medications, allergies, PMH, SH, FH, and reviewed ROS and examined the patient.  I agree with the assessment and plan. We need to allow 24-48hrs to see if he recovers neurologically. If not would support discontinuation of aggressive care after discussion with family.  Carlei Huang C. Daleen Squibb, MD, [facility] Pager:  [phone]

## 2010-12-15 NOTE — Progress Notes (Signed)
No response to painful stimuli; minimal eye opening to turning/suctioning. No cough or gag noted, no pupillary response, negative doll's eyes.

## 2010-12-15 NOTE — Progress Notes (Addendum)
ANTIBIOTIC CONSULT NOTE - INITIAL  Pharmacy Consult for vancomycin and zosyn Indication: rule out pneumonia  Allergies  Allergen Reactions  . Fortaz (Ceftazidime) Itching   Assessment: Jackson [name] is an 77 year old gentleman who presented with cardiac arrest and was subsequently placed on hypothermia protocol. His past medical history is complicated by ESRD. Pharmacy is consulted today to dose vancomycin and zosyn for possible pneumonia. Patient has wbc 9, temp 98.9 (just off hypothermia protocol), bal pending, cxr unimpressive. Of particular concern, patient has listed allergy to ceftazidime. Although cross reactivity is low, I have advised nursing to monitor patient closely upon zosyn administration.  Goal of Therapy:  Vancomycin pre-HD level 15-25 mcg/ml  Plan:  1. Vancomycin 1500 mg IV today 2. Vancomycin 750 mg with each HD session 3. Zosyn 2.25 grams IV every 8 hours 4. F/U BAL, wbc, temp, cxr  Patient Measurements: Height: 5\' 11"  (180.3 cm) Weight: 163 lb 2.3 oz (74 kg) IBW/kg (Calculated) : 75.3   Vital Signs: Temp: 98.9 F (37.2 C) (11/26 0800) Temp src: Axillary (11/26 0800) BP: 121/81 mmHg (11/26 1100) Pulse Rate: 93  (11/26 1315) Intake/Output from previous day: 11/25 0701 - 11/26 0700 In: 1880 [I.V.:1560; IV Piggyback:320] Out: 7 [Urine:7] Intake/Output from this shift: Total I/O In: 1260.5 [I.V.:1260.5] Out: 0   Labs:  Basename 12/15/10 0440 12/15/10 0400 12/15/10 01-05-2011 1805 January 05, 2011 1621 05-Jan-2011 1257  WBC -- 9.0 -- -- -- 8.5  HGB -- 7.6* -- 9.2* 9.5* --  PLT -- 57* -- -- -- 93*  LABCREA -- -- -- -- -- --  CREATININE 3.62* 3.66* 3.77* -- -- --   Estimated Creatinine Clearance: 17 ml/min (by C-G formula based on Cr of 3.62). No results found for this basename: VANCOTROUGH:2,VANCOPEAK:2,VANCORANDOM:2,GENTTROUGH:2,GENTPEAK:2,GENTRANDOM:2,TOBRATROUGH:2,TOBRAPEAK:2,TOBRARND:2,AMIKACINPEAK:2,AMIKACINTROU:2,AMIKACIN:2, in the last 72 hours    Microbiology: Recent Results (from the past 720 hour(s))  MRSA PCR SCREENING     Status: Normal   Collection Time   2011/01/05  4:36 PM      Component Value Range Status Comment   MRSA by PCR NEGATIVE  NEGATIVE  Final     Medical History: Past Medical History  Diagnosis Date  . Chronic systolic heart failure 09/06/2009  . ESRD on hemodialysis     Tues, Thurs, Sat  . Cardiomyopathy   . HTN (hypertension)   . Hyperlipemia   . Anemia     Multifactorial - iron deficiency and anemia of chronic disease in setting of ESRD. Last anemia panel (07/2005 ) - Fe 16, TIBC  203, Ferritin 350.  Marland Kitchen Gout   . Tricuspid regurgitation   . Thrombus     H/o apical thrombus  . Cardiac arrest 12/09/2007    with pulseless electrical activity 2/2 severe hyperkalemia (K > 7.5)  . Thyroid disease   . COPD (chronic obstructive pulmonary disease)   . GERD (gastroesophageal reflux disease)   . Cancer     prostate  . Rhabdomyolysis     H/O - secondary to blunt trauma to the buttock   . Secondary hyperparathyroidism   . 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 Chronic idiopathic thrombocytopenia     BL platelet 100-120.  Marland Kitchen Syncope     H/O multiple syncopal episodes thought to be vasovagal versus hypotension induced. Last episode  10/2009    Medications:  Prescriptions prior to admission  Medication Sig Dispense Refill  . aspirin 81 MG tablet Take 81 mg by mouth daily.       Marland Kitchen  B Complex-C-Folic Acid (RENA-VITE PO) Take 1 tablet by mouth daily.        . carvedilol (COREG) 12.5 MG tablet Take 12.5 mg by mouth 2 (two) times daily with a meal.        . chlorproMAZINE (THORAZINE) 25 MG tablet Take 25 mg by mouth as needed.        . finasteride (PROSCAR) 5 MG tablet Take 5 mg by mouth daily.        . isosorbide mononitrate (IMDUR) 30 MG 24 hr tablet Take 30 mg by mouth daily.        Marland Kitchen omeprazole (PRILOSEC) 40 MG capsule Take 40 mg by mouth daily.          Katrinka Blazing [place]  R 12/15/2010,1:44 PM

## 2010-12-16 ENCOUNTER — Inpatient Hospital Stay (HOSPITAL_COMMUNITY): Payer: Medicare Other

## 2010-12-16 ENCOUNTER — Other Ambulatory Visit: Payer: Medicare Other

## 2010-12-16 ENCOUNTER — Ambulatory Visit: Payer: Medicare Other | Admitting: Vascular Surgery

## 2010-12-16 DIAGNOSIS — I4901 Ventricular fibrillation: Secondary | ICD-10-CM

## 2010-12-16 LAB — GLUCOSE, CAPILLARY
Glucose-Capillary: 112 mg/dL — ABNORMAL HIGH (ref 70–99)
Glucose-Capillary: 12 mg/dL — CL (ref 70–99)
Glucose-Capillary: 136 mg/dL — ABNORMAL HIGH (ref 70–99)
Glucose-Capillary: 15 mg/dL — CL (ref 70–99)
Glucose-Capillary: 193 mg/dL — ABNORMAL HIGH (ref 70–99)
Glucose-Capillary: 32 mg/dL — CL (ref 70–99)
Glucose-Capillary: 33 mg/dL — CL (ref 70–99)
Glucose-Capillary: 35 mg/dL — CL (ref 70–99)
Glucose-Capillary: 49 mg/dL — ABNORMAL LOW (ref 70–99)
Glucose-Capillary: 63 mg/dL — ABNORMAL LOW (ref 70–99)
Glucose-Capillary: 83 mg/dL (ref 70–99)
Glucose-Capillary: <10 mg/dL — CL (ref 70–99)

## 2010-12-16 LAB — COMPREHENSIVE METABOLIC PANEL
ALT: 22 U/L (ref 0–53)
AST: 27 U/L (ref 0–37)
Albumin: 1.8 g/dL — ABNORMAL LOW (ref 3.5–5.2)
Alkaline Phosphatase: 137 U/L — ABNORMAL HIGH (ref 39–117)
Calcium: 7.7 mg/dL — ABNORMAL LOW (ref 8.4–10.5)
Potassium: 4.2 mEq/L (ref 3.5–5.1)
Sodium: 127 mEq/L — ABNORMAL LOW (ref 135–145)
Total Protein: 5.1 g/dL — ABNORMAL LOW (ref 6.0–8.3)

## 2010-12-16 LAB — BASIC METABOLIC PANEL
BUN: 31 mg/dL — ABNORMAL HIGH (ref 6–23)
Chloride: 100 mEq/L (ref 96–112)
GFR calc Af Amer: 12 mL/min — ABNORMAL LOW (ref >90)
Potassium: 4.2 mEq/L (ref 3.5–5.1)
Sodium: 131 mEq/L — ABNORMAL LOW (ref 135–145)

## 2010-12-16 LAB — PREPARE RBC (CROSSMATCH)

## 2010-12-16 LAB — CBC
HCT: 21.6 % — ABNORMAL LOW (ref 39.0–52.0)
Hemoglobin: 7 g/dL — ABNORMAL LOW (ref 13.0–17.0)
MCHC: 32.4 g/dL (ref 30.0–36.0)
RBC: 2.36 MIL/uL — ABNORMAL LOW (ref 4.22–5.81)

## 2010-12-16 MED ORDER — DEXTROSE 10 % IV SOLN
INTRAVENOUS | Status: DC
  Administered 2010-12-16 – 2010-12-17 (×3): via INTRAVENOUS
  Administered 2010-12-17: 500 mL via INTRAVENOUS
  Administered 2010-12-18 (×2): 40 mL/h via INTRAVENOUS
  Administered 2010-12-18 – 2010-12-22 (×6): via INTRAVENOUS
  Administered 2010-12-22: 40 mL via INTRAVENOUS

## 2010-12-16 MED ORDER — DEXTROSE 50 % IV SOLN
INTRAVENOUS | Status: AC
  Administered 2010-12-16: 50 mL
  Filled 2010-12-16: qty 50

## 2010-12-16 MED ORDER — ACETAMINOPHEN 160 MG/5ML PO SOLN: 650.0000 mg | Freq: Four times a day (QID) | ORAL | Status: DC | PRN

## 2010-12-16 MED ORDER — DEXTROSE 50 % IV SOLN: 25.0000 mL | Freq: Once | INTRAVENOUS | Status: AC | PRN

## 2010-12-16 MED ORDER — DEXTROSE 50 % IV SOLN
50.0000 mL | Freq: Once | INTRAVENOUS | Status: AC | PRN
  Administered 2010-12-16: 25 mL via INTRAVENOUS
  Filled 2010-12-16: qty 50

## 2010-12-16 MED ORDER — DEXTROSE 50 % IV SOLN
INTRAVENOUS | Status: AC
  Administered 2010-12-16: 01:00:00
  Filled 2010-12-16: qty 50

## 2010-12-16 MED ORDER — SODIUM CHLORIDE 0.9 % IV SOLN
750.0000 mg | Freq: Once | INTRAVENOUS | Status: AC
  Administered 2010-12-16: 750 mg via INTRAVENOUS
  Filled 2010-12-16: qty 750

## 2010-12-16 NOTE — Progress Notes (Signed)
HPI:  Jackson [name] is a 77 y.o. male admitted on 2011-01-06 with cardiac arrest after HD. Had shockable rhythm per AED applied by paramedics.  PMHx Cardiomyopathy with EF 30%, ESRD on HD T/Th/Sa, HTN, Hyperlipidemia, Anemia, Gout, TR  Antibiotics:   Zosyn 11/26>>  Vancomycin 11/26>>  Cultures/Sepsis Markers:   Sputum 11/26 Blood 11/27>>>  Access/Protocols:  11/24 R tunneled HD catheter (preadmission)  11/24 ETT>>>  11/24 OGT>>>  11/24 L [place] TLC>>>  11/25 Lt Radial [place]>>>  Best Practice: DVT: Hep SQ GI: Protonix  11/24 Cardiac arrest post HD, ACLS, hypothermia protocol started and complete.  Subjective: Rewarmed, no events overnight.  Physical Exam: Filed Vitals:   12/16/10 1133  BP: 130/56  Pulse: 98  Temp: 98.8 F (37.1 C)  Resp: 24    Intake/Output Summary (Last 24 hours) at 12/16/10 1159 Last data filed at 12/16/10 1100  Gross per 24 hour  Intake 4198.19 ml  Output      0 ml  Net 4198.19 ml   Vent Mode:  [-] PRVC FiO2 (%):  [29.7 %-30.4 %] 30.3 % Vt Set:  [500 mL] 500 mL PEEP:  [5 cmH20] 5 cmH20 Plateau Pressure:  [17 cmH20-25 cmH20] 25 cmH20  Neuro: Unresponsive. Cardiac: RRR, Nl S1/S2, -M/R/G. Pulmonary: Coarse BS diffusely. GI: Soft, NT, ND and +BS. Extremities: 2+ edema and -tenderness.  Labs: CBC    Component Value Date/Time   WBC 7.6 12/16/2010 0320   RBC 2.36* 12/16/2010 0320   HGB 7.0* 12/16/2010 0320   HCT 21.6* 12/16/2010 0320   PLT 48* 12/16/2010 0320   MCV 91.5 12/16/2010 0320   MCH 29.7 12/16/2010 0320   MCHC 32.4 12/16/2010 0320   RDW 20.4* 12/16/2010 0320   LYMPHSABS 3.4 01-06-2011 1257   MONOABS 0.4 January 06, 2011 1257   EOSABS 0.3 01/06/11 1257   BASOSABS 0.1 01-06-2011 1257   BMET    Component Value Date/Time   NA 131* 12/16/2010 0320   K 4.2 12/16/2010 0320   CL 100 12/16/2010 0320   CO2 22 12/16/2010 0320   GLUCOSE 109* 12/16/2010 0320   BUN 31* 12/16/2010 0320   CREATININE 4.93* 12/16/2010 0320   CALCIUM 8.1*  12/16/2010 0320   GFRNONAA 10* 12/16/2010 0320   GFRAA 12* 12/16/2010 0320   ABG    Component Value Date/Time   PHART 7.524* 12/14/2010 0134   PCO2ART 31.1* 12/14/2010 0134   PO2ART 77.0* 12/14/2010 0134   HCO3 26.8* 12/14/2010 0134   TCO2 28 12/14/2010 0134   ACIDBASEDEF 3.0* 01-06-11 1321   O2SAT 98.0 12/14/2010 0134    Lab 12/16/10 0320  MG 1.9   Lab Results  Component Value Date   CALCIUM 8.1* 12/16/2010   PHOS 2.5 12/25/2009    Chest Xray:   Assessment & Plan:  VDRF in setting of cardiac arrest  - Continue full vent support until mental status improved  - F/u CXR  - Bronchial hygiene   Purulent bronchitis with ?PNA  - F/U on cultures. - D2/x vancomycin, zosyn   Cardiac arrest with presumed VF with hx of systolic CHF and post-dialysis hypokalemia  - Rewarmed 11/26  - Consulted cardiology, will defer to cards.  Cardiogenic shock  - Wean off levophed to keep SBP > 90   ESRD  - HD per renal  - Monitor electrolytes closely   Toxic metabolic encephalopathy  - Hold sedation and monitor neuro status for HIE, will call neuro.  Anemia  - F/U CBC. -transfuse for Hb < 7  Thrombocytopenia  - F/U CBC   Hypoglycemia  - Monitor CBG's  - Will have nutrition recommend/start tube feeds   Best practices / Disposition  - SCDs for DVT Px  - Protonix for GI Px   Need family meeting for decision on plan of care.  Will order an EEG in the mean time and call neuro for after EEG is done to prognosticate on a post arrest situation.  The patient is critically ill with multiple organ systems failure and requires high complexity decision making for assessment and support, frequent evaluation and titration of therapies, application of advanced monitoring technologies and extensive interpretation of multiple databases. Critical Care Time devoted to patient care services described in this note is 45 minutes.  Koren Bound, MD

## 2010-12-16 NOTE — Progress Notes (Signed)
eLink Physician-Brief Progress Note Patient Name: Jackson [name] DOB: 1928/08/20 MRN: [id number]  Date of Service  12/16/2010   HPI/Events of Note   Hypoglycemia  eICU Interventions  Call from nurse reporting ongoing issues with hypoglycemia.  Earlier in pm patient had increase in infusion rate of D10 to 100 cc/hr.  Blood sugar remains in 30s.  D50 to be given and increase in D10 rate to 125 cc/hr   Intervention Category Major Interventions: Electrolyte abnormality - evaluation and management Intermediate Interventions: Other: (hypoglycemia) Minor Interventions: Routine modifications to care plan (e.g. PRN medications for pain, fever)  Cressida Milford 12/16/2010, 3:53 AM

## 2010-12-16 NOTE — Progress Notes (Signed)
eLink Physician-Brief Progress Note Patient Name: Jackson [name] DOB: July 04, 1928 MRN: [id number]  Date of Service  12/16/2010   HPI/Events of Note   RN reports Tfs in vent tubing  eICU Interventions  Dc TFs pCXR now , OG appears in position in am film   Intervention Category  Intermediate Interventions: Diagnostic test evaluation   ALVA,RAKESH V. 12/16/2010, 9:24 PM

## 2010-12-16 NOTE — Progress Notes (Addendum)
CBG: 49  Treatment: D50 IV 25 mL  Symptoms: None  Follow-up CBG: Time 1155 CBG Result: 193  Possible Reasons for Event: Inadequate meal intake  Comments/MD notified:n/a    Jackson [name]

## 2010-12-16 NOTE — Progress Notes (Addendum)
Subjective: Interval History: Sedation off for roughly 24 hours or more - patient has not waked up  Objective: As above, not awake despite no sedation for 29 hours Spiked fever to 102 Remains on pressor support (levophed) Last dialysis was Saturday    . antiseptic oral rinse  1 application Mouth Rinse QID  . artificial tears  1 application Both Eyes Q8H  . chlorhexidine  15 mL Mouth/Throat BID  . dextrose      . dextrose      . feeding supplement  30 mL Per Tube BID  . insulin aspart  0-9 Units Subcutaneous Q4H  . levalbuterol  0.63 mg Nebulization QID  . pantoprazole sodium  40 mg Per Tube Q24H  . piperacillin-tazobactam (ZOSYN)  IV  2.25 g Intravenous Q8H  . vancomycin  1,500 mg Intravenous Once  . DISCONTD: fentaNYL  100 mcg Intravenous Once  . DISCONTD: insulin aspart  0-9 Units Subcutaneous TID WC  . DISCONTD: midazolam  2 mg Intravenous Once  . DISCONTD: pantoprazole (PROTONIX) IV  40 mg Intravenous Q24H      . sodium chloride 20 mL/hr at 12/16/10 0900  . dextrose 125 mL/hr at 12/16/10 0900  . feeding supplement (NEPRO CARB STEADY) 1,000 mL (12/15/10 1428)  . norepinephrine (LEVOPHED) Adult infusion 14 mcg/min (12/16/10 0900)  . DISCONTD: fentaNYL infusion INTRAVENOUS Stopped (12/15/10 0423)  . DISCONTD: midazolam (VERSED) infusion Stopped (12/15/10 0423)   Vital signs in last 24 hours:  Temp:  [98.5 F (36.9 C)-102.1 F (38.9 C)] 102.1 F (38.9 C) (11/27 0839) Pulse Rate:  [88-110] 97  (11/27 0900) Resp:  [11-24] 24  (11/27 0900) BP: (79-130)/(47-81) 105/54 mmHg (11/27 0500) SpO2:  [100 %] 100 % (11/27 0900) Arterial Line BP: (77-143)/(43-67) 112/67 mmHg (11/27 0900) FiO2 (%):  [29.7 %-40.2 %] 30 % (11/27 0900) Weight:  [71.4 kg (157 lb 6.5 oz)] 157 lb 6.5 oz (71.4 kg) (11/27 0700) Weight change: -2.6 kg (-5 lb 11.7 oz)  Intake/Output: I/O last 3 completed shifts: In: 5421.9 [I.V.:4239.4; NG/GT:532.5; IV Piggyback:650] Out: 7 [Urine:7]  Intake/Output this  shift:  Total I/O In: 416.2 [I.V.:316.2; NG/GT:100] Out: -   EXAM: Responds to pain by withdrawal No response to voice CVS-S1S2 no S3  Cannot hear murmur RS-Bilateral diffuse rhonchi  ABD-Bowel sounds present, soft EXT- Not much in the way of peripheral edema Permacath in place  Lab Results:  Basename 12/16/10 0320 12/15/10 0400 12/14/2010 1805 11/28/2010 1257  WBC 7.6 9.0 -- 8.5  HGB 7.0* 7.6* 9.2* --  HCT 21.6* 23.2* 27.0* --  PLT 48* 57* -- 93*   BMET  Basename 12/16/10 0320 12/15/10 0440 12/15/10 0400  NA 131* 135 134*  K 4.2 4.4 4.6  CL 100 106 105  CO2 22 20 19   GLUCOSE 109* 142* 202*  BUN 31* 18 19  CREATININE 4.93* 3.62* 3.66*  CALCIUM 8.1* 7.1* 7.3*  PHOS -- -- --   LFT  Basename 12/15/10 0400  PROT 5.2*  ALBUMIN 1.8*  AST 52*  ALT 36  ALKPHOS 131*  BILITOT 0.5  BILIDIR --  IBILI --   PT/INR  Basename 12/15/10 0400 12/14/10 0001  LABPROT 20.4* 22.2*  INR 1.71* 1.91*   Hepatitis Panel No results found for this basename: HEPBSAG,HCVAB,HEPAIGM,HEPBIGM in the last 72 hours PTH: Lab Results  Component Value Date   CALCIUM 8.1* 12/16/2010   CAION 0.94* 12/09/2010   PHOS 2.5 12/25/2009    Studies/Results: Dg Chest Port 1 View  12/16/2010  *RADIOLOGY REPORT*  Clinical Data: Respiratory failure.  PORTABLE CHEST - 1 VIEW  Comparison: 12/15/2010.  Findings: Endotracheal tube tip is at the level of the carina. Recommend retracting endotracheal tube tip by 3 cm.  Right central line tip distal superior vena cava level.  Left central line tip mid superior vena cava level.  Cardiomegaly.  Asymmetric air space disease most notable in the lung bases.  This may represent pulmonary edema although infiltrate not excluded in the proper clinical setting.  No gross pneumothorax.  Nasogastric tube courses below the diaphragm.  The tip is not included on this exam.  IMPRESSION: Endotracheal tube tip is at the level of the carina.   Recommend retracting endotracheal tube  tip by 3 cm.  Asymmetric air space disease most notable lung bases without significant change.  Question pulmonary edema and / or infectious infiltrate?  Original Report Authenticated By: Fuller Canada, M.D.   Dg Chest Port 1 View  12/15/2010  *RADIOLOGY REPORT*  Clinical Data: Respiratory failure.  PORTABLE CHEST - 1 VIEW  Comparison: Chest x-ray 12/04/2010.  Findings: The endotracheal tube is 13 mm above the carina and should be retracted 2 cm. The remaining support apparatus is stable.  The cardiac silhouette, mediastinal and hilar contours are stable.  The lungs demonstrate slight improved aeration with resolving edema and atelectasis.  IMPRESSION:  1.  The endotracheal tube is 13 mm above the carina and should be retracted 2 cm. 2.  The remaining support apparatus is stable. 3.  Slight improved lung aeration with resolving edema and atelectasis or infiltrates.  Original Report Authenticated By: P. Loralie Champagne, M.D.    Assessment/Plan: 82 year with  ESRD, chronic systolic CHF, COPD, chronic low plts, s/p cardiac arrest (VFIB)in the field post dialysis on Saturday, who underwent cooling protocol, and has not awakened despite discontinuation of sedation for over 24 hours; remains on some pressor and ventilator dependent; now febrile and hypoglycemic  ESRD-Due for dialysis today; BP's in past hour a little better in 120's despite ongoing need for levophed.  We will attempt HD but if intolerant, and full measure of support is the plan, will require CVVHD  ANEMIA-Hb under 8, needs PRBC's. On max Aranesp and weekly iron with dialysis  MBD-no binders or Vitamin D right now  HTN/VOL-Volume up by about 5 liters,, CXR can't distinguish volume from infiltrate; will need vol removal if possible with dialysis  ACCESS-permcath  Fever - ? pulm source; on antibiotic coverage with Vancomycin and Zosyn; resp cultures pending; get blood cultures    LOS: 3 Syriana Croslin B @TODAY @9 :38 AM

## 2010-12-16 NOTE — Progress Notes (Signed)
Pt has has had hypoglycemic issues all night long; hypoglycemic protocol initiated and followed; MD made aware twice for adjustments with fluids and to notify of pt's status; orders received; will continue to assess and monitor closely ;

## 2010-12-16 NOTE — Progress Notes (Signed)
Subjective: Patient is still sedated and intubated, overnight had no major events except for fever spike, and hypoglycemic episode, which has currently resolved.  Objective: Vital signs in last 24 hours: Filed Vitals:   12/16/10 0300 12/16/10 0339 12/16/10 0400 12/16/10 0500  BP:  79/47 87/49 105/54  Pulse: 100  100 97  Temp:   98.5 F (36.9 C)   TempSrc:   Oral   Resp: 17 20 19 16   Height:      Weight:      SpO2: 100% 100% 100% 100%   Weight change:   Intake/Output Summary (Last 24 hours) at 12/16/10 0725 Last data filed at 12/16/10 1610  Gross per 24 hour  Intake 4235.65 ml  Output      0 ml  Net 4235.65 ml   Physical Exam: General:  Vital signs reviewed and noted. Intubated, not responding to commands or painful stimuli. No purposeful movements.   Head:  Normocephalic, atraumatic.   Eyes:  Pupils fixed and constricted. No signs of anemia or jaundince.   Nose:  Mucous membranes moist, not inflammed, nonerythematous.   Throat:  Oropharynx nonerythematous, mildly dry.   Neck:  No deformities, masses, or tenderness noted.Supple, No carotid Bruits, no JVD.   Lungs:  Normal respiratory effort. Diffuse rhonchi, Basilar crackles. Prolonged expiration   Heart:  RRR. S1 and S2 normal without gallop, murmur, or rubs.   Abdomen:  BS normoactive. Soft, Nondistended, non-tender. No masses or organomegaly.   Extremities:  no edema in LE, with mild dependant edema in UE.     Lab Results: Basic Metabolic Panel:  Lab 12/16/10 9604 12/15/10 0440  NA 131* 135  K 4.2 4.4  CL 100 106  CO2 22 20  GLUCOSE 109* 142*  BUN 31* 18  CREATININE 4.93* 3.62*  CALCIUM 8.1* 7.1*  MG 1.9 --  PHOS -- --   Liver Function Tests:  Lab 12/15/10 0400 12/14/10 1600  AST 52* 56*  ALT 36 31  ALKPHOS 131* 113  BILITOT 0.5 0.6  PROT 5.2* 4.1*  ALBUMIN 1.8* 1.5*   CBC:  Lab 12/16/10 0320 12/15/10 0400 12/12/2010 1257  WBC 7.6 9.0 --  NEUTROABS -- -- 4.3  HGB 7.0* 7.6* --  HCT 21.6* 23.2* --    MCV 91.5 90.6 --  PLT 48* 57* --   Cardiac Enzymes:  Lab 12/15/10 0654 12/14/10 0500 12/14/10  CKTOTAL 29 77 79  CKMB 4.5* 7.3* 6.5*  CKMBINDEX -- -- --  TROPONINI 0.35* 0.75* 0.90*   CBG:  Lab 12/16/10 0442 12/16/10 0349 12/16/10 0257 12/16/10 0224 12/16/10 0104 12/15/10 2347  GLUCAP 112* 32* 95 100* <10*33* 83   Coagulation:  Lab 12/15/10 0400 12/14/10 0001 11/20/2010 1600 12/08/2010 1257  LABPROT 20.4* 22.2* 19.3* 20.4*  INR 1.71* 1.91* 1.60* 1.71*    Micro Results: Recent Results (from the past 240 hour(s))  MRSA PCR SCREENING     Status: Normal   Collection Time   12/05/2010  4:36 PM      Component Value Range Status Comment   MRSA by PCR NEGATIVE  NEGATIVE  Final    Studies/Results: Dg Chest Port 1 View  12/15/2010  *RADIOLOGY REPORT*  Clinical Data: Respiratory failure.  PORTABLE CHEST - 1 VIEW  Comparison: Chest x-ray 11/22/2010.  Findings: The endotracheal tube is 13 mm above the carina and should be retracted 2 cm. The remaining support apparatus is stable.  The cardiac silhouette, mediastinal and hilar contours are stable.  The lungs demonstrate slight improved  aeration with resolving edema and atelectasis.  IMPRESSION:  1.  The endotracheal tube is 13 mm above the carina and should be retracted 2 cm. 2.  The remaining support apparatus is stable. 3.  Slight improved lung aeration with resolving edema and atelectasis or infiltrates.  Original Report Authenticated By: P. Loralie Champagne, M.D.   Medications:  Scheduled Meds:   . antiseptic oral rinse  1 application Mouth Rinse QID  . artificial tears  1 application Both Eyes Q8H  . chlorhexidine  15 mL Mouth/Throat BID  . dextrose      . dextrose      . dextrose      . feeding supplement  30 mL Per Tube BID  . insulin aspart  0-9 Units Subcutaneous Q4H  . levalbuterol  0.63 mg Nebulization QID  . pantoprazole sodium  40 mg Per Tube Q24H  . piperacillin-tazobactam (ZOSYN)  IV  2.25 g Intravenous Q8H  . vancomycin   1,500 mg Intravenous Once  . DISCONTD: fentaNYL  100 mcg Intravenous Once  . DISCONTD: insulin aspart  0-9 Units Subcutaneous TID WC  . DISCONTD: midazolam  2 mg Intravenous Once  . DISCONTD: pantoprazole (PROTONIX) IV  40 mg Intravenous Q24H   Continuous Infusions:   . sodium chloride 20 mL/hr at 12/16/10 0500  . dextrose 125 mL/hr at 12/16/10 0500  . feeding supplement (NEPRO CARB STEADY) 1,000 mL (12/15/10 1428)  . norepinephrine (LEVOPHED) Adult infusion 10 mcg/min (12/16/10 0552)  . DISCONTD: fentaNYL infusion INTRAVENOUS Stopped (12/15/10 0423)  . DISCONTD: midazolam (VERSED) infusion Stopped (12/15/10 0423)   PRN Meds:.dextrose, dextrose, fentaNYL, levalbuterol, midazolam  Assessment/Plan: Pt is a 77 y.o. yo male with a PMHX of ESRD on HD, systolic congestive heart failure, chronic thrombocytopenia, mixed anemia, who presented to [facility] on 12/15/2010 with after sustaining witnessed cardiopulmonary (v.fib) arrest after dialysis session, requiring intubation on field. Was admitted by [facility] service, and artic sun protocol initiated 12/02/2010, rewarming initiated 11/25. Pt remains without purposeful movements, not arousable to even painful stimuli. Cardiology consulted for cardiac evaluation in setting of cardiopulmonary arrest.   Cardiopulmonary arrest - the cause of this patient's cardiopulmonary arrest is not completely evident at this time. Likely precipitated by marked hypokalemia-induced arrythmia, with admission potassium notably 2.6, and ventricular fibrillation noted per EMS report. May also be more prone to ventricular arrhythmias at baseline given his significant cardiomyopathy. Coronary heart disease in this pt with cardiac risk factors including hypertension, hyperlipidemia, remote tobacco abuse, ESRD, and obesity cannot be ruled out, as well, there was mild troponin elevation on admission (which has been slowly downtrending 0.98 --> 0.35) This likely reflects resuscitative efforts.   --Can consider further invasive cardiac evaluation after patients mental status improves. --Given apparent volume overload, patient may benefit from CVVHD. --Maintain hemoglobin at goal of 8, given Ischemic CHF.  --Given fever spike consider pan-cultures and possiblly ID consultation.    LOS: 3 days   Jackson [name],Jackson [name] 12/16/2010, 7:25 AM  Attending Note:   The patient was seen and examined.  Agree with assessment and plan as noted above.  The patient has still not woken up after [employer] protocol.  Known CHF with EF of 35% .  Has CAD - would prefer a Hb.  Of 8.  Renal may need to transfuse with CVVHD.  Appears to be volume overloaded.  Vesta Mixer, Montez Hageman., MD, [facility] 12/16/2010, 8:51 AM

## 2010-12-16 NOTE — Progress Notes (Signed)
UR Completed.   Jackson [name] [phone] 12/16/2010

## 2010-12-17 ENCOUNTER — Inpatient Hospital Stay (HOSPITAL_COMMUNITY): Payer: Medicare Other

## 2010-12-17 LAB — POCT I-STAT 3, ART BLOOD GAS (G3+)
O2 Saturation: 98 %
pCO2 arterial: 34.3 mmHg — ABNORMAL LOW (ref 35.0–45.0)
pH, Arterial: 7.518 — ABNORMAL HIGH (ref 7.350–7.450)

## 2010-12-17 LAB — CBC
HCT: 26.5 % — ABNORMAL LOW (ref 39.0–52.0)
Hemoglobin: 9 g/dL — ABNORMAL LOW (ref 13.0–17.0)
MCHC: 34 g/dL (ref 30.0–36.0)
RDW: 17.9 % — ABNORMAL HIGH (ref 11.5–15.5)
WBC: 6.8 10*3/uL (ref 4.0–10.5)

## 2010-12-17 LAB — TYPE AND SCREEN: Unit division: 0

## 2010-12-17 LAB — GLUCOSE, CAPILLARY
Glucose-Capillary: 66 mg/dL — ABNORMAL LOW (ref 70–99)
Glucose-Capillary: 81 mg/dL (ref 70–99)

## 2010-12-17 LAB — RENAL FUNCTION PANEL
Calcium: 7.5 mg/dL — ABNORMAL LOW (ref 8.4–10.5)
Creatinine, Ser: 3.08 mg/dL — ABNORMAL HIGH (ref 0.50–1.35)
GFR calc Af Amer: 20 mL/min — ABNORMAL LOW (ref >90)
GFR calc non Af Amer: 18 mL/min — ABNORMAL LOW (ref >90)
Phosphorus: 1.5 mg/dL — ABNORMAL LOW (ref 2.3–4.6)
Sodium: 130 mEq/L — ABNORMAL LOW (ref 135–145)

## 2010-12-17 LAB — MAGNESIUM: Magnesium: 1.6 mg/dL (ref 1.5–2.5)

## 2010-12-17 MED ORDER — POTASSIUM CHLORIDE 20 MEQ/15ML (10%) PO LIQD
40.0000 meq | Freq: Every day | ORAL | Status: DC
  Filled 2010-12-17: qty 30

## 2010-12-17 MED ORDER — POTASSIUM PHOSPHATE DIBASIC 3 MMOLE/ML IV SOLN
20.0000 mmol | Freq: Once | INTRAVENOUS | Status: AC
  Administered 2010-12-17: 20 mmol via INTRAVENOUS
  Filled 2010-12-17: qty 6.67

## 2010-12-17 MED ORDER — MAGNESIUM SULFATE 40 MG/ML IJ SOLN
2.0000 g | Freq: Once | INTRAMUSCULAR | Status: AC
  Administered 2010-12-17: 2 g via INTRAVENOUS
  Filled 2010-12-17: qty 50

## 2010-12-17 MED ORDER — METOPROLOL TARTRATE 1 MG/ML IV SOLN
5.0000 mg | Freq: Four times a day (QID) | INTRAVENOUS | Status: DC | PRN
  Administered 2010-12-18: 5 mg via INTRAVENOUS
  Filled 2010-12-17: qty 5

## 2010-12-17 MED ORDER — MAGNESIUM SULFATE BOLUS VIA INFUSION: 2.0000 g | Freq: Once | INTRAVENOUS | Status: DC

## 2010-12-17 MED ORDER — SODIUM CHLORIDE 0.9 % IV BOLUS (SEPSIS)
500.0000 mL | Freq: Once | INTRAVENOUS | Status: AC
  Administered 2010-12-17: 500 mL via INTRAVENOUS

## 2010-12-17 NOTE — Procedures (Signed)
EEG ID:  [id number].  HISTORY:  This is an 77 year old woman with syncope per report.  MEDICATIONS:  No anti-convulsive medications per report on admission.  CONDITION OF RECORDING:  This 16-lead EEG was performed with patient in awake and in drowsy states.  Background rhythm and background patterns were well-organized with a well-sustained posterior dominant rhythm of 5- 6 Hz, symmetrical and reactive to eye opening and closing.  Abnormal Potentials: no epileptiform activity or focal slowing was noted.  ACTIVATION PROCEDURES:  Hyperventilation was not performed.  Photic stimulation did not activate tracing.  EKG: single channel EKG monitoring detected an irregular rhythm.  IMPRESSION:  This is an abnormal awake and drowsy EEG due to presence of mild-to-moderate diffuse background slowing.  The finding may be suggestive of mild-to-moderate diffuse cerebral dysfunction and may be due to toxic metabolic encephalopathy, neurodegenerative disorder, and/or Bi-hemispheric structural abnormality.  Single channel of EKG monitoring detected an irregular rhythm.  If clinically warranted, clinical correlation was suggested.          ______________________________ Carmell Austria, MD    [id number] D:  12/16/2010 21:37:37  T:  12/16/2010 23:54:39  Job #:  [id number]

## 2010-12-17 NOTE — Progress Notes (Signed)
HPI:  Jackson [name] is a 77 y.o. male admitted on 12/02/2010 with cardiac arrest after HD. Had shockable rhythm per AED applied by paramedics.  PMHx Cardiomyopathy with EF 30%, ESRD on HD T/Th/Sa, HTN, Hyperlipidemia, Anemia, Gout, TR  Antibiotics:   Zosyn 11/26>>  Vancomycin 11/26>>  Cultures/Sepsis Markers:   Sputum 11/26 Blood 11/27>>>  Access/Protocols:  11/24 R tunneled HD catheter (preadmission)  11/24 ETT>>>  11/24 OGT>>>  11/24 L [place] TLC>>>  11/25 Lt Radial Aline>>>  Best Practice: DVT: Hep SQ GI: Protonix  11/24 Cardiac arrest post HD, ACLS, hypothermia protocol started and complete.  Subjective: Rewarmed, no events overnight.  Physical Exam: Filed Vitals:   12/16/10 1133  BP: 130/56  Pulse: 98  Temp: 98.8 F (37.1 C)  Resp: 24    Intake/Output Summary (Last 24 hours) at 12/17/10 1002 Last data filed at 12/17/10 0800  Gross per 24 hour  Intake 3604.69 ml  Output   2100 ml  Net 1504.69 ml   Vent Mode:  [-] PRVC FiO2 (%):  [29.7 %-30.5 %] 30 % Set Rate:  [12 bmp] 12 bmp Vt Set:  [500 mL] 500 mL PEEP:  [5 cmH20] 5 cmH20 Plateau Pressure:  [16 cmH20-25 cmH20] 17 cmH20  Neuro: Unresponsive. Cardiac: RRR, Nl S1/S2, -M/R/G. Pulmonary: Coarse BS diffusely. GI: Soft, NT, ND and +BS. Extremities: 2+ edema and -tenderness.  Labs: CBC    Component Value Date/Time   WBC 6.8 12/17/2010 0406   RBC 3.02* 12/17/2010 0406   HGB 9.0* 12/17/2010 0406   HCT 26.5* 12/17/2010 0406   PLT 53* 12/17/2010 0406   MCV 87.7 12/17/2010 0406   MCH 29.8 12/17/2010 0406   MCHC 34.0 12/17/2010 0406   RDW 17.9* 12/17/2010 0406   LYMPHSABS 3.4 12/06/2010 1257   MONOABS 0.4 11/30/2010 1257   EOSABS 0.3 12/03/2010 1257   BASOSABS 0.1 12/19/2010 1257   BMET    Component Value Date/Time   NA 130* 12/17/2010 0406   K 3.2* 12/17/2010 0406   CL 97 12/17/2010 0406   CO2 25 12/17/2010 0406   GLUCOSE 133* 12/17/2010 0406   BUN 19 12/17/2010 0406   CREATININE 3.08*  12/17/2010 0406   CALCIUM 7.5* 12/17/2010 0406   GFRNONAA 18* 12/17/2010 0406   GFRAA 20* 12/17/2010 0406   ABG    Component Value Date/Time   PHART 7.518* 12/17/2010 0438   PCO2ART 34.3* 12/17/2010 0438   PO2ART 96.0 12/17/2010 0438   HCO3 27.8* 12/17/2010 0438   TCO2 29 12/17/2010 0438   ACIDBASEDEF 3.0* 12/07/2010 1321   O2SAT 98.0 12/17/2010 0438    Lab 12/17/10 0406  MG 1.6   Lab Results  Component Value Date   CALCIUM 7.5* 12/17/2010   PHOS 1.5* 12/17/2010    Chest Xray:   Assessment & Plan:  VDRF in setting of cardiac arrest  - PS trials today, no SBT as there is no airway protection. - F/u CXR. - Bronchial hygiene.  Purulent bronchitis with ?PNA  - F/U on cultures. - D3/x vancomycin, zosyn   Shock:  Likely developing septic shock. - Per family discussion no further escalation of care and no pressors. - IVF only, bolus already ordered and on continuous fluid on board.  Cardiac arrest with presumed VF with hx of systolic CHF and post-dialysis hypokalemia  - Rewarmed 11/26  - Consulted cardiology, will defer to cards.  ESRD  - HD per renal  - Monitor electrolytes closely   Toxic metabolic encephalopathy  - Hold sedation  and monitor neuro status for HIE, will call neuro.  Anemia  - F/U CBC. -transfuse for Hb < 7   Thrombocytopenia  - F/U CBC   Hypoglycemia  - Monitor CBG's  - Will have nutrition recommend/start tube feeds   Best practices / Disposition  - SCDs for DVT Px  - Protonix for GI Px   Family discussion today, patient would not want this level of intervention per family, they are certain he would not want to have a trach/peg scenario and will make a full LCB with no CPR, cardioversion, pressors or anti-arrhythemics and once family is ready will terminally extubate when they are all ready.  The patient is critically ill with multiple organ systems failure and requires high complexity decision making for assessment and support,  frequent evaluation and titration of therapies, application of advanced monitoring technologies and extensive interpretation of multiple databases. Critical Care Time devoted to patient care services described in this note is 45 minutes.  Koren Bound, MD

## 2010-12-17 NOTE — Progress Notes (Signed)
eLink Physician-Brief Progress Note Patient Name: Jackson [name] DOB: May 22, 1928 MRN: [id number]  Date of Service  12/17/2010   HPI/Events of Note  TF stopped earlier due to retention and placed on D10 infusion by Dr. Vassie Loll - blood sugar now 66   eICU Interventions  Plan: Increase D10 rate to 125 cc/hr   Intervention Category Major Interventions: Electrolyte abnormality - evaluation and management Intermediate Interventions: Other: (hypoglycemia) Minor Interventions: Routine modifications to care plan (e.g. PRN medications for pain, fever)  Marquie Aderhold 12/17/2010, 3:56 AM

## 2010-12-17 NOTE — Progress Notes (Signed)
eLink Physician-Brief Progress Note Patient Name: Jackson [name] DOB: 09/05/1928 MRN: [id number]  Date of Service  12/17/2010   HPI/Events of Note     eICU Interventions  Lopressor prn for hypertension   Intervention Category  Intermediate Interventions: Hypertension - evaluation and management   ALVA,RAKESH V. 12/17/2010, 9:07 PM

## 2010-12-17 NOTE — Progress Notes (Signed)
Subjective: Patient is sedated and intubated, overnight had no arrhythmias and no other major events except for fever spike, and hypoglycemic episode, patients mental status has not improved despite being off sedation for prolonged periods.   Objective: Vital signs in last 24 hours: Filed Vitals:   12/17/10 0600 12/17/10 0615 12/17/10 0630 12/17/10 0645  BP: 84/46 83/49 89/52  102/55  Pulse: 104 105 103 97  Temp:      TempSrc:      Resp: 16 17 18 16   Height:      Weight:      SpO2: 100% 100% 100% 100%   Weight change: 0 lb (0 kg)  Intake/Output Summary (Last 24 hours) at 12/17/10 0716 Last data filed at 12/17/10 1610  Gross per 24 hour  Intake 3798.99 ml  Output   2100 ml  Net 1698.99 ml   Physical Exam: General: Vital signs reviewed and noted. Intubated, not responding to commands or painful stimuli. No purposeful movements.  Eyes: Pupils fixed and constricted. No signs of anemia or jaundince.  Throat: Oropharynx nonerythematous, mildly dry.  Neck: No deformities, masses, or tenderness noted.Supple, No carotid Bruits, no JVD.  Lungs: Normal respiratory effort. Diffuse rhonchi, Basilar crackles. Prolonged expiration  Heart: RRR. S1 and S2 normal without gallop, murmur, or rubs.  Abdomen: BS normoactive. Soft, Nondistended, non-tender. No masses or organomegaly.  Extremities: no edema in LE, with mild dependant edema in UE.   Lab Results: Basic Metabolic Panel:  Lab 12/17/10 9604 12/16/10 0700 12/16/10 0320  NA 130* 127* --  K 3.2* 4.2 --  CL 97 96 --  CO2 25 21 --  GLUCOSE 133* 149* --  BUN 19 35* --  CREATININE 3.08* 5.21* --  CALCIUM 7.5* 7.7* --  MG 1.6 -- 1.9  PHOS 1.5* -- --   Liver Function Tests:  Lab 12/17/10 0406 12/16/10 0700 12/15/10 0400  AST -- 27 52*  ALT -- 22 36  ALKPHOS -- 137* 131*  BILITOT -- 0.5 0.5  PROT -- 5.1* 5.2*  ALBUMIN 1.6* 1.8* --   CBC:  Lab 12/17/10 0406 12/16/10 0320 2011-01-06 1257  WBC 6.8 7.6 --  NEUTROABS -- -- 4.3  HGB  9.0* 7.0* --  HCT 26.5* 21.6* --  MCV 87.7 91.5 --  PLT 53* 48* --   Cardiac Enzymes:  Lab 12/15/10 0654 12/14/10 0500 12/14/10  CKTOTAL 29 77 79  CKMB 4.5* 7.3* 6.5*  CKMBINDEX -- -- --  TROPONINI 0.35* 0.75* 0.90*   CBG:  Lab 12/17/10 0346 12/16/10 2349 12/16/10 1946 12/16/10 1644 12/16/10 1642 12/16/10 1641  GLUCAP 66* 81 133* 136* <10* 15*   Coagulation:  Lab 12/15/10 0400 12/14/10 0001 01/06/2011 1600 01-06-11 1257  LABPROT 20.4* 22.2* 19.3* 20.4*  INR 1.71* 1.91* 1.60* 1.71*    Micro Results: Recent Results (from the past 240 hour(s))  MRSA PCR SCREENING     Status: Normal   Collection Time   Jan 06, 2011  4:36 PM      Component Value Range Status Comment   MRSA by PCR NEGATIVE  NEGATIVE  Final   CULTURE, RESPIRATORY     Status: Normal (Preliminary result)   Collection Time   12/15/10 11:15 AM      Component Value Range Status Comment   Specimen Description ENDOTRACHEAL   Final    Special Requests NONE   Final    Gram Stain     Final    Value: ABUNDANT WBC PRESENT, PREDOMINANTLY PMN     NO SQUAMOUS  EPITHELIAL CELLS SEEN     RARE GRAM POSITIVE COCCI IN PAIRS   Culture Culture reincubated for better growth   Final    Report Status PENDING   Incomplete    Studies/Results: Dg Chest Port 1 View  12/16/2010  *RADIOLOGY REPORT*  Clinical Data: Query aspiration.  PORTABLE CHEST - 1 VIEW  Comparison: 12/16/2010 at 5:33 a.m.  Findings: The endotracheal tube tip is approximately 2 cm above the carina.  Nasogastric tube is in place.  A right internal jugular central venous catheter is noted with tip at the cavoatrial junction.  Left central line tip projects over the SVC.  Airspace opacity noted in the left lower lobe.  There is hazy opacity along the right hemithorax but with improved aeration of the right lower lobe.  IMPRESSION: 1.  Improved aeration of the right lower lobe, with hazy opacity over the right hemithorax which could reflect a layering pleural effusion.  2.   Continued airspace opacity in the left lower lobe, possibly pneumonia or atelectasis. Aspiration pneumonitis cannot be excluded, although the endotracheal tube may have a protective effect against new aspiration.  3.  The endotracheal tube has been retracted and its tip is 2 cm above the carina.  Original Report Authenticated By: Dellia Cloud, M.D.   Dg Chest Port 1 View  12/16/2010  *RADIOLOGY REPORT*  Clinical Data: Respiratory failure.  PORTABLE CHEST - 1 VIEW  Comparison: 12/15/2010.  Findings: Endotracheal tube tip is at the level of the carina. Recommend retracting endotracheal tube tip by 3 cm.  Right central line tip distal superior vena cava level.  Left central line tip mid superior vena cava level.  Cardiomegaly.  Asymmetric air space disease most notable in the lung bases.  This may represent pulmonary edema although infiltrate not excluded in the proper clinical setting.  No gross pneumothorax.  Nasogastric tube courses below the diaphragm.  The tip is not included on this exam.  IMPRESSION: Endotracheal tube tip is at the level of the carina.   Recommend retracting endotracheal tube tip by 3 cm.  Asymmetric air space disease most notable lung bases without significant change.  Question pulmonary edema and / or infectious infiltrate?  Original Report Authenticated By: Fuller Canada, M.D.   Medications:  Scheduled Meds:   . antiseptic oral rinse  1 application Mouth Rinse QID  . chlorhexidine  15 mL Mouth/Throat BID  . feeding supplement  30 mL Per Tube BID  . insulin aspart  0-9 Units Subcutaneous Q4H  . levalbuterol  0.63 mg Nebulization QID  . pantoprazole sodium  40 mg Per Tube Q24H  . piperacillin-tazobactam (ZOSYN)  IV  2.25 g Intravenous Q8H  . vancomycin  750 mg Intravenous Once  . DISCONTD: artificial tears  1 application Both Eyes Q8H   Continuous Infusions:   . sodium chloride 20 mL/hr at 12/16/10 0900  . dextrose 500 mL (12/17/10 0609)  . norepinephrine  (LEVOPHED) Adult infusion 5 mcg/min (12/17/10 1610)  . DISCONTD: dextrose 125 mL/hr at 12/16/10 1234  . DISCONTD: feeding supplement (NEPRO CARB STEADY) 1,000 mL (12/15/10 1428)   PRN Meds:.dextrose, dextrose, fentaNYL, levalbuterol, midazolam, DISCONTD: acetaminophen (TYLENOL) oral liquid 160 mg/5 mL Assessment/Plan: No change in plan. 77 year old male with history of VIFB arrest in 2010, chronic systolic CHF EF 35%, COPD, ESRD, s/p cardiac arrest at home post dialysis on 11/24, who underwent cooling protocol 11/24-11/25, and has not awakened since despite d/c of sedation for prolonged periods; now spiking fever,  on broad antibiotcs and intermittently requiring pressors.   Ventricular Fibrillation Arrest - The cause of this patient's cardiopulmonary arrest is not completely evident at this time. May have been precipitated by marked hypokalemia, with admission potassium notably 2.6. May also be more prone to ventricular arrhythmias at baseline given his significant cardiomyopathy. Note that pt had mild troponin elevation on admission (which has been slowly downtrending 0.98 --> 0.35) This likely reflects resuscitative efforts.  --We will consider further invasive cardiac evaluation for his VFIB arrest only after patients mental status improves.  --Patient remains volume overloaded, restrict fluids as much as possible and continue HD/CVVHD.  --Maintain hemoglobin at goal of 8, given Ischemic CHF.       LOS: 4 days   TOBBIA,PATRICK 12/17/2010, 7:16 AM Patient seen and examined. I agree with the assessment and plan as detailed above. See also my additional thoughts below.   The patient is elderly with multiple comorbidities. I have spoken with Dr. Excell Seltzer today, who follows him as an outpatient. Dr. Excell Seltzer has very carefully considered whether an ICD should be considered over time. It has been felt in the past that this was not indicated because of the patient's age and multiple comorbidities. The  patient has end-stage renal disease on dialysis. Unfortunately there has been no significant return of CNS function. The outlook is very limited. His volume status is being treated with dialysis.  Willa Rough, MD, [facility] 12/17/2010 9:12 AM

## 2010-12-17 NOTE — Progress Notes (Signed)
Subjective:  HD done yesterday but with much difficulty, were able to remove 2L.  However still no meaningful neuro recovery, family discussions being held.  BP still low but off pressors, low BS requiring d10 at 125/hour  Objective Vital signs in last 24 hours: Filed Vitals:   12/17/10 0800 12/17/10 0817 12/17/10 0822 12/17/10 0850  BP:  99/54    Pulse: 97 97  94  Temp:      TempSrc:      Resp: 17 16  17   Height:      Weight:      SpO2:  100% 100%    Weight change: 0 kg (0 lb)  Intake/Output Summary (Last 24 hours) at 12/17/10 1019 Last data filed at 12/17/10 0800  Gross per 24 hour  Intake 3604.69 ml  Output   2100 ml  Net 1504.69 ml   Labs: Basic Metabolic Panel:  Lab 12/17/10 1610 12/16/10 0700 12/16/10 0320  NA 130* 127* 131*  K 3.2* 4.2 4.2  CL 97 96 100  CO2 25 21 22   GLUCOSE 133* 149* 109*  BUN 19 35* 31*  CREATININE 3.08* 5.21* 4.93*  CALCIUM 7.5* 7.7* 8.1*  ALB -- -- --  PHOS 1.5* -- --   Liver Function Tests:  Lab 12/17/10 0406 12/16/10 0700 12/15/10 0400 12/14/10 1600  AST -- 27 52* 56*  ALT -- 22 36 31  ALKPHOS -- 137* 131* 113  BILITOT -- 0.5 0.5 0.6  PROT -- 5.1* 5.2* 4.1*  ALBUMIN 1.6* 1.8* 1.8* --   No results found for this basename: LIPASE:3,AMYLASE:3 in the last 168 hours No results found for this basename: AMMONIA:3 in the last 168 hours CBC:  Lab 12/17/10 0406 12/16/10 0320 12/15/10 0400 12/08/2010 1257  WBC 6.8 7.6 9.0 --  NEUTROABS -- -- -- 4.3  HGB 9.0* 7.0* 7.6* --  HCT 26.5* 21.6* 23.2* --  MCV 87.7 91.5 90.6 91.1  PLT 53* 48* 57* --   Cardiac Enzymes:  Lab 12/15/10 0654 12/14/10 0500 12/14/10 12/17/2010 1800  CKTOTAL 29 77 79 133  CKMB 4.5* 7.3* 6.5* 6.0*  CKMBINDEX -- -- -- --  TROPONINI 0.35* 0.75* 0.90* 0.98*   CBG:  Lab 12/17/10 0748 12/17/10 0346 12/16/10 2349 12/16/10 1946 12/16/10 1644  GLUCAP 153* 66* 81 133* 136*    Iron Studies: No results found for this basename: IRON,TIBC,TRANSFERRIN,FERRITIN in the last 72  hours Studies/Results: Dg Chest Port 1 View  12/17/2010  *RADIOLOGY REPORT*  Clinical Data: ET tube placement, shortness of breath.  PORTABLE CHEST - 1 VIEW  Comparison: None.  Findings: Diffuse interstitial prominence throughout the lungs, likely mild edema.  Heart is borderline in size.  There is bibasilar atelectasis or infiltrates, increased since prior study.  IMPRESSION: Increasing bibasilar atelectasis or infiltrates.  Increasing interstitial prominence, likely interstitial edema.  Original Report Authenticated By: Cyndie Chime, M.D.   Dg Chest Port 1 View  12/16/2010  *RADIOLOGY REPORT*  Clinical Data: Query aspiration.  PORTABLE CHEST - 1 VIEW  Comparison: 12/16/2010 at 5:33 a.m.  Findings: The endotracheal tube tip is approximately 2 cm above the carina.  Nasogastric tube is in place.  A right internal jugular central venous catheter is noted with tip at the cavoatrial junction.  Left central line tip projects over the SVC.  Airspace opacity noted in the left lower lobe.  There is hazy opacity along the right hemithorax but with improved aeration of the right lower lobe.  IMPRESSION: 1.  Improved aeration of  the right lower lobe, with hazy opacity over the right hemithorax which could reflect a layering pleural effusion.  2.  Continued airspace opacity in the left lower lobe, possibly pneumonia or atelectasis. Aspiration pneumonitis cannot be excluded, although the endotracheal tube may have a protective effect against new aspiration.  3.  The endotracheal tube has been retracted and its tip is 2 cm above the carina.  Original Report Authenticated By: Dellia Cloud, M.D.   Dg Chest Port 1 View  12/16/2010  *RADIOLOGY REPORT*  Clinical Data: Respiratory failure.  PORTABLE CHEST - 1 VIEW  Comparison: 12/15/2010.  Findings: Endotracheal tube tip is at the level of the carina. Recommend retracting endotracheal tube tip by 3 cm.  Right central line tip distal superior vena cava level.  Left  central line tip mid superior vena cava level.  Cardiomegaly.  Asymmetric air space disease most notable in the lung bases.  This may represent pulmonary edema although infiltrate not excluded in the proper clinical setting.  No gross pneumothorax.  Nasogastric tube courses below the diaphragm.  The tip is not included on this exam.  IMPRESSION: Endotracheal tube tip is at the level of the carina.   Recommend retracting endotracheal tube tip by 3 cm.  Asymmetric air space disease most notable lung bases without significant change.  Question pulmonary edema and / or infectious infiltrate?  Original Report Authenticated By: Fuller Canada, M.D.   Medications: Infusions:    . sodium chloride 20 mL/hr at 12/16/10 0900  . dextrose 125 mL/hr at 12/17/10 1018  . norepinephrine (LEVOPHED) Adult infusion 3 mcg/min (12/17/10 0800)  . DISCONTD: dextrose 125 mL/hr at 12/16/10 1234  . DISCONTD: feeding supplement (NEPRO CARB STEADY) 1,000 mL (12/15/10 1428)    Scheduled Medications:    . antiseptic oral rinse  1 application Mouth Rinse QID  . chlorhexidine  15 mL Mouth/Throat BID  . feeding supplement  30 mL Per Tube BID  . levalbuterol  0.63 mg Nebulization QID  . magnesium sulfate IVPB  2 g Intravenous Once  . pantoprazole sodium  40 mg Per Tube Q24H  . piperacillin-tazobactam (ZOSYN)  IV  2.25 g Intravenous Q8H  . potassium phosphate IVPB (mmol)  20 mmol Intravenous Once  . sodium chloride  500 mL Intravenous Once  . vancomycin  750 mg Intravenous Once  . DISCONTD: artificial tears  1 application Both Eyes Q8H  . DISCONTD: insulin aspart  0-9 Units Subcutaneous Q4H  . DISCONTD: magnesium  2 g Intravenous Once  . DISCONTD: potassium chloride  40 mEq Oral Daily    have reviewed scheduled and prn medications.  Physical Exam: General: no sedation, unresponsive Heart: RRR Lungs: fio2 of 30%, CBS bilat Abdomen: soft, nt Extremities: dependant edema  Dialysis Access: PC in place    I Assessment/ Plan: Pt is a 77 y.o. yo male who was admitted on 12/15/2010 with  Cardiac arrest at HD.  With maximal support, not improving  Assessment/Plan: 1. S/p cardiac arrest-  Very little neurologic improvement now 4 days out, possible irreversible anoxic brain injury? 2. ESRD- usually TTS.  Next treatment would be tomorrow, pt very tenuous and family discussions are being held.  Will re eval in AM.  No acute need for HD today.  Issue is that he is getting a lot of IVF so that will catch up to him 3. Anemia- transfuse PRN, add aranesp 4. Secondary hyperparathyroidism- meds on hold right now 5. HTN/volume- getting IVF but fio2 is OK.  Needs ivf for support right now.  If survives/improves- will need aggressive fluid removal later 6. Dispo- appears poor prognosis, will follow  Keshanna Riso A   12/17/2010,10:19 AM  LOS: 4 days

## 2010-12-18 ENCOUNTER — Inpatient Hospital Stay (HOSPITAL_COMMUNITY): Payer: Medicare Other

## 2010-12-18 DIAGNOSIS — J9600 Acute respiratory failure, unspecified whether with hypoxia or hypercapnia: Secondary | ICD-10-CM

## 2010-12-18 DIAGNOSIS — N186 End stage renal disease: Secondary | ICD-10-CM

## 2010-12-18 DIAGNOSIS — I4901 Ventricular fibrillation: Secondary | ICD-10-CM

## 2010-12-18 LAB — CBC
HCT: 26.8 % — ABNORMAL LOW (ref 39.0–52.0)
MCV: 86.5 fL (ref 78.0–100.0)
RDW: 18 % — ABNORMAL HIGH (ref 11.5–15.5)
WBC: 5.8 10*3/uL (ref 4.0–10.5)

## 2010-12-18 LAB — BASIC METABOLIC PANEL
BUN: 27 mg/dL — ABNORMAL HIGH (ref 6–23)
CO2: 23 mEq/L (ref 19–32)
Chloride: 88 mEq/L — ABNORMAL LOW (ref 96–112)
Creatinine, Ser: 4.02 mg/dL — ABNORMAL HIGH (ref 0.50–1.35)
GFR calc Af Amer: 15 mL/min — ABNORMAL LOW (ref >90)

## 2010-12-18 LAB — GLUCOSE, CAPILLARY
Glucose-Capillary: 119 mg/dL — ABNORMAL HIGH (ref 70–99)
Glucose-Capillary: 129 mg/dL — ABNORMAL HIGH (ref 70–99)
Glucose-Capillary: 136 mg/dL — ABNORMAL HIGH (ref 70–99)

## 2010-12-18 LAB — POCT I-STAT 3, ART BLOOD GAS (G3+)
pCO2 arterial: 39 mmHg (ref 35.0–45.0)
pH, Arterial: 7.413 (ref 7.350–7.450)

## 2010-12-18 MED ORDER — PARICALCITOL 5 MCG/ML IV SOLN: 2.0000 ug | INTRAVENOUS | Status: DC

## 2010-12-18 MED ORDER — PROPOFOL 10 MG/ML IV EMUL
5.0000 ug/kg/min | INTRAVENOUS | Status: DC
  Administered 2010-12-26: 5 ug/kg/min via INTRAVENOUS
  Filled 2010-12-18 (×2): qty 100

## 2010-12-18 MED ORDER — DEXTROSE 50 % IV SOLN
INTRAVENOUS | Status: AC
  Filled 2010-12-18: qty 50

## 2010-12-18 MED ORDER — DARBEPOETIN ALFA-POLYSORBATE 60 MCG/0.3ML IJ SOLN
60.0000 ug | INTRAMUSCULAR | Status: DC
  Administered 2010-12-18 – 2010-12-25 (×2): 60 ug via INTRAVENOUS
  Filled 2010-12-18 (×3): qty 0.3

## 2010-12-18 MED ORDER — FENTANYL CITRATE 0.05 MG/ML IJ SOLN
50.0000 ug | Freq: Once | INTRAMUSCULAR | Status: AC
  Administered 2010-12-18: 50 ug via INTRAVENOUS
  Filled 2010-12-18: qty 2

## 2010-12-18 MED ORDER — VANCOMYCIN HCL 1000 MG IV SOLR
750.0000 mg | Freq: Once | INTRAVENOUS | Status: AC
  Administered 2010-12-18: 750 mg via INTRAVENOUS
  Filled 2010-12-18: qty 750

## 2010-12-18 MED ORDER — HEPARIN SODIUM (PORCINE) 1000 UNIT/ML DIALYSIS
20.0000 [IU]/kg | INTRAMUSCULAR | Status: DC | PRN
  Administered 2010-12-18 – 2010-12-22 (×3): 1500 [IU] via INTRAVENOUS_CENTRAL

## 2010-12-18 MED ORDER — PARICALCITOL 5 MCG/ML IV SOLN
2.0000 ug | INTRAVENOUS | Status: DC
  Administered 2010-12-18 – 2011-01-03 (×7): 2 ug via INTRAVENOUS
  Filled 2010-12-18 (×9): qty 0.4

## 2010-12-18 MED ORDER — DARBEPOETIN ALFA-POLYSORBATE 60 MCG/0.3ML IJ SOLN
INTRAMUSCULAR | Status: AC
  Administered 2010-12-18: 60 ug via INTRAVENOUS
  Filled 2010-12-18: qty 0.3

## 2010-12-18 MED ORDER — PARICALCITOL 5 MCG/ML IV SOLN
INTRAVENOUS | Status: AC
  Administered 2010-12-18: 2 ug via INTRAVENOUS
  Filled 2010-12-18: qty 1

## 2010-12-18 MED ORDER — METOPROLOL TARTRATE 12.5 MG HALF TABLET
12.5000 mg | ORAL_TABLET | Freq: Four times a day (QID) | ORAL | Status: DC
  Administered 2010-12-18 – 2010-12-20 (×6): 12.5 mg via ORAL
  Filled 2010-12-18 (×13): qty 1

## 2010-12-18 NOTE — Progress Notes (Signed)
HPI:  Jackson [name] is a 77 y.o. male admitted on Dec 21, 2010 with cardiac arrest after HD. Had shockable rhythm per AED applied by paramedics.  PMHx Cardiomyopathy with EF 30%, ESRD on HD T/Th/Sa, HTN, Hyperlipidemia, Anemia, Gout, TR  Antibiotics:   Zosyn 11/26>>  Vancomycin 11/26>>  Cultures/Sepsis Markers:   Sputum 11/26 Blood 11/27>>>  Access/Protocols:  11/24 R tunneled HD catheter (preadmission)  11/24 ETT>>>  11/24 OGT>>>  11/24 L [place] TLC>>>  11/25 Lt Radial [place]>>>  Best Practice: DVT: Hep SQ GI: Protonix  11/24 Cardiac arrest post HD, ACLS, hypothermia protocol started and complete.  Subjective: Rewarmed, no events overnight.  Physical Exam: Filed Vitals:   12/18/10 0800  BP: 144/92  Pulse: 83  Temp: 97.8 F (36.6 C)  Resp: 14    Intake/Output Summary (Last 24 hours) at 12/18/10 0851 Last data filed at 12/18/10 0839  Gross per 24 hour  Intake 3826.26 ml  Output      0 ml  Net 3826.26 ml   Vent Mode:  [-] CPAP FiO2 (%):  [29.7 %-30.2 %] 30 % Set Rate:  [12 bmp] 12 bmp Vt Set:  [500 mL] 500 mL PEEP:  [5 cmH20-5.1 cmH20] 5 cmH20 Pressure Support:  [5 cmH20] 5 cmH20 Plateau Pressure:  [14 cmH20-17 cmH20] 14 cmH20  Neuro: Unresponsive. Cardiac: RRR, Nl S1/S2, -M/R/G. Pulmonary: Coarse BS diffusely. GI: Soft, NT, ND and +BS. Extremities: 2+ edema and -tenderness.  Labs: CBC    Component Value Date/Time   WBC 5.8 12/18/2010 0341   RBC 3.10* 12/18/2010 0341   HGB 9.3* 12/18/2010 0341   HCT 26.8* 12/18/2010 0341   PLT 34* 12/18/2010 0341   MCV 86.5 12/18/2010 0341   MCH 30.0 12/18/2010 0341   MCHC 34.7 12/18/2010 0341   RDW 18.0* 12/18/2010 0341   LYMPHSABS 3.4 12/21/10 1257   MONOABS 0.4 December 21, 2010 1257   EOSABS 0.3 Dec 21, 2010 1257   BASOSABS 0.1 12-21-2010 1257   BMET    Component Value Date/Time   NA 121* 12/18/2010 0341   K 3.3* 12/18/2010 0341   CL 88* 12/18/2010 0341   CO2 23 12/18/2010 0341   GLUCOSE 131* 12/18/2010 0341   BUN 27* 12/18/2010 0341   CREATININE 4.02* 12/18/2010 0341   CALCIUM 7.5* 12/18/2010 0341   GFRNONAA 13* 12/18/2010 0341   GFRAA 15* 12/18/2010 0341   ABG    Component Value Date/Time   PHART 7.413 12/18/2010 0329   PCO2ART 39.0 12/18/2010 0329   PO2ART 117.0* 12/18/2010 0329   HCO3 25.0* 12/18/2010 0329   TCO2 26 12/18/2010 0329   ACIDBASEDEF 3.0* 12-21-10 1321   O2SAT 99.0 12/18/2010 0329    Lab 12/18/10 0341  MG 2.0   Lab Results  Component Value Date   CALCIUM 7.5* 12/18/2010   PHOS 3.1 12/18/2010    Chest Xray:   Assessment & Plan:  VDRF in setting of cardiac arrest.   - PS trials today, no SBT as there is no airway protection. - F/u CXR. - Bronchial hygiene.  Purulent bronchitis with ?PNA.  - F/U on cultures. - D4/x vancomycin and zosyn.  Shock:  Now resolved. - Per family discussion no further escalation of care and no pressors on 11/28, but since the nursing report since yesterday the family is requesting a conversation regarding code status.  I have called neuro and once seen will communicate with the family and discuss this again. - IVF only, bolus already ordered and on continuous fluid on board.  Cardiac arrest  with presumed VF with hx of systolic CHF and post-dialysis hypokalemia  - Rewarmed 11/26  - Consulted cardiology, will defer to cards.  ESRD  - HD per renal. - Monitor electrolytes closely.  Anoxic encephalopathy  - D/C versed/fentanyl (patient has not received any since Monday 11/26) and has been dialyzed since. - Will check ammonia level today.  Anemia  - F/U CBC. - Transfuse for Hb < 7.   Thrombocytopenia, resolved. - F/U CBC.   Hypoglycemia  - Monitor CBG's.  - Will have nutrition recommend/start tube feeds.   Best practices / Disposition  - SCDs for DVT Px  - Protonix for GI Px   Bedside RN overnight informed family that patient is following commandsx1.  I find that very highly doubtful.  Unfortunately she informed the  family that the patient followed command and now there is a lot more turmoil within the family.  EEG results are noted, neuro called and once she sees the patient will communicate again with the family.  The patient is critically ill with multiple organ systems failure and requires high complexity decision making for assessment and support, frequent evaluation and titration of therapies, application of advanced monitoring technologies and extensive interpretation of multiple databases. Critical Care Time devoted to patient care services described in this note is 45 minutes.  Koren Bound, M.D.

## 2010-12-18 NOTE — Progress Notes (Signed)
Subjective:  Patient maybe waking up some? Opens eyes to stimuli but could not get to follow commands Objective Vital signs in last 24 hours: Filed Vitals:   12/18/10 0700 12/18/10 0742 12/18/10 0750 12/18/10 0800  BP: 125/84 160/97  144/92  Pulse: 85 93  83  Temp:    97.8 F (36.6 C)  TempSrc:    Oral  Resp: 13 14  14   Height:      Weight:      SpO2: 100% 100% 100% 100%   Weight change: 4.8 kg (10 lb 9.3 oz)  Intake/Output Summary (Last 24 hours) at 12/18/10 0907 Last data filed at 12/18/10 0839  Gross per 24 hour  Intake 3721.26 ml  Output      0 ml  Net 3721.26 ml   Labs: Basic Metabolic Panel:  Lab 12/18/10 4098 12/17/10 0406 12/16/10 0700  NA 121* 130* 127*  K 3.3* 3.2* 4.2  CL 88* 97 96  CO2 23 25 21   GLUCOSE 131* 133* 149*  BUN 27* 19 35*  CREATININE 4.02* 3.08* 5.21*  CALCIUM 7.5* 7.5* 7.7*  ALB -- -- --  PHOS 3.1 1.5* --   Liver Function Tests:  Lab 12/17/10 0406 12/16/10 0700 12/15/10 0400 12/14/10 1600  AST -- 27 52* 56*  ALT -- 22 36 31  ALKPHOS -- 137* 131* 113  BILITOT -- 0.5 0.5 0.6  PROT -- 5.1* 5.2* 4.1*  ALBUMIN 1.6* 1.8* 1.8* --   No results found for this basename: LIPASE:3,AMYLASE:3 in the last 168 hours No results found for this basename: AMMONIA:3 in the last 168 hours CBC:  Lab 12/18/10 0341 12/17/10 0406 12/16/10 0320 12/15/10 0400 11/25/2010 1257  WBC 5.8 6.8 7.6 -- --  NEUTROABS -- -- -- -- 4.3  HGB 9.3* 9.0* 7.0* -- --  HCT 26.8* 26.5* 21.6* -- --  MCV 86.5 87.7 91.5 90.6 91.1  PLT 34* 53* 48* -- --   Cardiac Enzymes:  Lab 12/15/10 0654 12/14/10 0500 12/14/10 11/23/2010 1800  CKTOTAL 29 77 79 133  CKMB 4.5* 7.3* 6.5* 6.0*  CKMBINDEX -- -- -- --  TROPONINI 0.35* 0.75* 0.90* 0.98*   CBG:  Lab 12/18/10 0329 12/18/10 0003 12/17/10 2050 12/17/10 1948 12/17/10 1617  GLUCAP 133* 119* 129* 123* 134*    Iron Studies: No results found for this basename: IRON,TIBC,TRANSFERRIN,FERRITIN in the last 72 hours Studies/Results: Dg  Chest Port 1 View  12/18/2010  *RADIOLOGY REPORT*  Clinical Data: Evaluate endotracheal tube position  PORTABLE CHEST - 1 VIEW  Comparison: Portable chest x-ray of 12/17/2010  Findings: The tip of the endotracheal tube is approximately 8-0.6 cm above the carina.  There is evidence of pulmonary vascular congestion present with probable small effusions.  Cardiomegaly is stable.  Left and right central venous lines remain.  IMPRESSION:  1.  Endotracheal tip approximately 2.6 cm above the carina. 2.  Little change in probable mild pulmonary vascular congestion and small effusions.  Original Report Authenticated By: Juline Patch, M.D.   Dg Chest Port 1 View  12/17/2010  *RADIOLOGY REPORT*  Clinical Data: ET tube placement, shortness of breath.  PORTABLE CHEST - 1 VIEW  Comparison: None.  Findings: Diffuse interstitial prominence throughout the lungs, likely mild edema.  Heart is borderline in size.  There is bibasilar atelectasis or infiltrates, increased since prior study.  IMPRESSION: Increasing bibasilar atelectasis or infiltrates.  Increasing interstitial prominence, likely interstitial edema.  Original Report Authenticated By: Cyndie Chime, M.D.   Dg Chest [facility]  1 View  12/16/2010  *RADIOLOGY REPORT*  Clinical Data: Query aspiration.  PORTABLE CHEST - 1 VIEW  Comparison: 12/16/2010 at 5:33 a.m.  Findings: The endotracheal tube tip is approximately 2 cm above the carina.  Nasogastric tube is in place.  A right internal jugular central venous catheter is noted with tip at the cavoatrial junction.  Left central line tip projects over the SVC.  Airspace opacity noted in the left lower lobe.  There is hazy opacity along the right hemithorax but with improved aeration of the right lower lobe.  IMPRESSION: 1.  Improved aeration of the right lower lobe, with hazy opacity over the right hemithorax which could reflect a layering pleural effusion.  2.  Continued airspace opacity in the left lower lobe, possibly  pneumonia or atelectasis. Aspiration pneumonitis cannot be excluded, although the endotracheal tube may have a protective effect against new aspiration.  3.  The endotracheal tube has been retracted and its tip is 2 cm above the carina.  Original Report Authenticated By: Dellia Cloud, M.D.   Medications: Infusions:    . sodium chloride 20 mL/hr (12/18/10 0839)  . dextrose 125 mL/hr at 12/18/10 0226  . DISCONTD: norepinephrine (LEVOPHED) Adult infusion Stopped (12/17/10 1000)    Scheduled Medications:    . antiseptic oral rinse  1 application Mouth Rinse QID  . chlorhexidine  15 mL Mouth/Throat BID  . levalbuterol  0.63 mg Nebulization QID  . magnesium sulfate IVPB  2 g Intravenous Once  . metoprolol tartrate  12.5 mg Oral Q6H  . pantoprazole sodium  40 mg Per Tube Q24H  . piperacillin-tazobactam (ZOSYN)  IV  2.25 g Intravenous Q8H  . potassium phosphate IVPB (mmol)  20 mmol Intravenous Once  . sodium chloride  500 mL Intravenous Once  . DISCONTD: feeding supplement  30 mL Per Tube BID  . DISCONTD: insulin aspart  0-9 Units Subcutaneous Q4H  . DISCONTD: magnesium  2 g Intravenous Once  . DISCONTD: potassium chloride  40 mEq Oral Daily    have reviewed scheduled and prn medications.  Physical Exam: General: no sedation, opens eyes to stimuli.  Edematous Heart: RRR Lungs: fio2 of 30%, CBS bilat Abdomen: soft, nt Extremities: dependant edema  Dialysis Access: PC in place   I Assessment/ Plan: Pt is a 77 y.o. yo male who was admitted on 12/18/2010 with  Cardiac arrest at HD.  With maximal support, awaiting improvement.  Assessment/Plan: 1. S/p cardiac arrest- Little neurologic improvement now 4 days out, possible irreversible anoxic brain injury? Neuro to get involved today 2. ESRD- usually TTS.  Will plan for HD today, need mostly for volume 3. Anemia- transfuse PRN, add aranesp 4. Secondary hyperparathyroidism- holding binders, phos is good, slightly hypocalcemic so  add low dose vitamin D  5. HTN/volume- getting IVF but fio2 is OK. As thought, high IVFs are catching up to him, will try moderate fluid removal with HD today.  Will attempt to decrease d10 ivf and still keep sugar up. 6. Dispo- appears poor prognosis, will follow.  Neuro to see today  Tranika Scholler A   12/18/2010,9:07 AM  LOS: 5 days

## 2010-12-18 NOTE — Progress Notes (Signed)
PHARMACY - CRITICAL CARE PROGRESS NOTE  Pharmacy Consult for CCM patient with vancomycin/zosyn consult Indication: Sepsis/?PNA   Allergies  Allergen Reactions  . Fortaz (Ceftazidime) Itching   Medications:  Scheduled:    . antiseptic oral rinse  1 application Mouth Rinse QID  . chlorhexidine  15 mL Mouth/Throat BID  . darbepoetin (ARANESP) injection - DIALYSIS  60 mcg Intravenous Q Thu-HD  . levalbuterol  0.63 mg Nebulization QID  . magnesium sulfate IVPB  2 g Intravenous Once  . metoprolol tartrate  12.5 mg Oral Q6H  . pantoprazole sodium  40 mg Per Tube Q24H  . paricalcitol  2 mcg Intravenous Q T,Th,Sa-HD  . piperacillin-tazobactam (ZOSYN)  IV  2.25 g Intravenous Q8H  . potassium phosphate IVPB (mmol)  20 mmol Intravenous Once  . DISCONTD: feeding supplement  30 mL Per Tube BID  . DISCONTD: paricalcitol  2 mcg Intravenous 3 times weekly   Infusions:    . sodium chloride 20 mL/hr (12/18/10 0839)  . dextrose 125 mL/hr at 12/18/10 0226  . DISCONTD: norepinephrine (LEVOPHED) Adult infusion Stopped (12/17/10 1000)    Assessment: Mr. Saintjean is an 77 year old gentleman in critical condition with complications secondary to cardiac arrest and likely sepsis/?pna.  1. ID - pna/sespis: day#2 vancomycin/zosyn, respiratory BAL oral flora, blood ngtd (awaiting final report), mrsa pcr-, 24 hr tmax 97.8, still fully ventilated, cxr with apparent vascular congestion, to undergo hd today.  2. VDRF - GCS 9<---11, unresponsive to touch although eyes moving without purpose. No sedation currently. Ventilated with FiO2 30 satting 100%.  3. S/P cardiac arrest - Pressures increased today off pressors, will start low dose beta blocker four times daily.  4. Hypoglycemia - Notable hypoglycemic episodes last few days on D10 drip. Sugars better in the 120-130 range. Will begin tube feed consult today.  5. ESRD - T/Th/Sa HD, last session Tuesday. Will undergo HD again today per renal.  Plan:  1.  Vancomycin 750 mg with HD today for goal pre-HD level 15-25, will order level if vancomycin continues through the weekend 2. Continue zosyn 2.25 grams every 8 hours IV 3. F/U final blood culture results 4. F/U HD schedule for vancomycin dosing  Jolly Carlini, [place] R 12/18/2010,10:33 AM

## 2010-12-18 NOTE — Progress Notes (Signed)
UR Completed.  Jackson [name] Jane [phone] 12/18/2010  

## 2010-12-18 NOTE — Consult Note (Signed)
Reason for Consult:Altered mental status s/p arrest Referring Physician: Molli Knock  CC: Altered mental status  HPI: Jackson [name] is an 77 y.o. male who experienced a cardiac arrest on 11/24 post HD. Underwent hypothermia protocol and was rewarmed on the 26th.  Despite rewarming and discontinuation of sedation, the patient has not regained his baseline mental status.  Has had an EEG that shows general background slowing and is reactive.  MRI of the brain has been performed as well and shows evidence of lacunes, small vessel ischemic changes and chronic small hemorrhagic changes but no evidence of diffuse cerebral injury as would be expected with anoxic brain injury.   Past Medical History  Diagnosis Date  . Chronic systolic heart failure 09/06/2009  . ESRD on hemodialysis     Tues, Thurs, Sat  . Cardiomyopathy   . HTN (hypertension)   . Hyperlipemia   . Anemia     Multifactorial - iron deficiency and anemia of chronic disease in setting of ESRD. Last anemia panel (07/2005 ) - Fe 16, TIBC  203, Ferritin 350.  Marland Kitchen Gout   . Tricuspid regurgitation   . Thrombus     H/o apical thrombus  . Cardiac arrest 12/09/2007    with pulseless electrical activity 2/2 severe hyperkalemia (K > 7.5)  . Thyroid disease   . COPD (chronic obstructive pulmonary disease)   . GERD (gastroesophageal reflux disease)   . Cancer     prostate  . Rhabdomyolysis     H/O - secondary to blunt trauma to the buttock   . Secondary hyperparathyroidism   . 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 Chronic idiopathic thrombocytopenia     BL platelet 100-120.  Marland Kitchen Syncope     H/O multiple syncopal episodes thought to be vasovagal versus hypotension induced. Last episode  10/2009    Past Surgical History  Procedure Date  . Hernia repair   . Av fistula placement   . Jugular catheter placement   . 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 Right common iliac artery  aneurysm repair 10/2009    History reviewed. No pertinent family history.  Social History:  reports that he has quit smoking. His smoking use included Cigarettes. He quit after 40 years of use. He does not have any smokeless tobacco history on file. He reports that he does not drink alcohol. His drug history not on file.  Allergies  Allergen Reactions  . Fortaz (Ceftazidime) Itching    Medications:  I have reviewed the patient's current medications. Scheduled:   . antiseptic oral rinse  1 application Mouth Rinse QID  . chlorhexidine  15 mL Mouth/Throat BID  . darbepoetin (ARANESP) injection - DIALYSIS  60 mcg Intravenous Q Thu-HD  . levalbuterol  0.63 mg Nebulization QID  . metoprolol tartrate  12.5 mg Oral Q6H  . pantoprazole sodium  40 mg Per Tube Q24H  . paricalcitol  2 mcg Intravenous Q T,Th,Sa-HD  . piperacillin-tazobactam (ZOSYN)  IV  2.25 g Intravenous Q8H  . potassium phosphate IVPB (mmol)  20 mmol Intravenous Once  . vancomycin  750 mg Intravenous Once  . DISCONTD: paricalcitol  2 mcg Intravenous 3 times weekly    ROS: Unable to obtain  Blood pressure 156/75, pulse 82, temperature 97.8 F (36.6 C), temperature source Oral, resp. rate 20, height 5\' 11"  (1.803 m), weight 76.2 kg (167 lb 15.9 oz), SpO2 100.00%.  Neurologic Examination: Mental Status: Patient  does not respond to verbal stimuli.  With deep sternal rub seems to localize to pain and seems to make an attempt to push my arm away with his RUE.  Does not follow commands.  No verbalizations noted.  Cranial Nerves: II: patient does not respond confrontation bilaterally, pupils right 3 mm, left 3 mm,and reactive bilaterally III,IV,VI: doll's response present bilaterally.  V,VII: corneal reflex absent on the left  VIII: patient does not respond to verbal stimuli IX,X: gag reflex unable to test. Patient intubated.   XI: trapezius strength unable to test bilaterally XII: tongue strength unable to  test Motor: Extremities flaccid throughout.  No spontaneous movement noted.  Sensory: Responds to noxious stimuli in all extremities.  Pulls both arms up to his chest.  Withdraws legs to painful stimuli. Deep Tendon Reflexes:  Absent throughout. Plantars: upgoing bilaterally Cerebellar: Unable to perform   Results for orders placed during the hospital encounter of 2010-12-16 (from the past 48 hour(s))  GLUCOSE, CAPILLARY     Status: Abnormal   Collection Time   12/16/10  4:41 PM      Component Value Range Comment   Glucose-Capillary 15 (*) 70 - 99 (mg/dL)    Comment 1 Repeat Test     GLUCOSE, CAPILLARY     Status: Abnormal   Collection Time   12/16/10  4:42 PM      Component Value Range Comment   Glucose-Capillary <10 (*) 70 - 99 (mg/dL) REPEATED TO VERIFY, CHARGE CREDITED  GLUCOSE, CAPILLARY     Status: Abnormal   Collection Time   12/16/10  4:44 PM      Component Value Range Comment   Glucose-Capillary 136 (*) 70 - 99 (mg/dL)   GLUCOSE, CAPILLARY     Status: Abnormal   Collection Time   12/16/10  7:46 PM      Component Value Range Comment   Glucose-Capillary 133 (*) 70 - 99 (mg/dL)    Comment 1 Notify RN      Comment 2 Documented in Chart     GLUCOSE, CAPILLARY     Status: Normal   Collection Time   12/16/10 11:49 PM      Component Value Range Comment   Glucose-Capillary 81  70 - 99 (mg/dL)    Comment 1 Notify RN      Comment 2 Documented in Chart     GLUCOSE, CAPILLARY     Status: Abnormal   Collection Time   12/17/10  3:46 AM      Component Value Range Comment   Glucose-Capillary 66 (*) 70 - 99 (mg/dL)    Comment 1 Notify RN      Comment 2 Documented in Chart     RENAL FUNCTION PANEL     Status: Abnormal   Collection Time   12/17/10  4:06 AM      Component Value Range Comment   Sodium 130 (*) 135 - 145 (mEq/L)    Potassium 3.2 (*) 3.5 - 5.1 (mEq/L)    Chloride 97  96 - 112 (mEq/L)    CO2 25  19 - 32 (mEq/L)    Glucose, Bld 133 (*) 70 - 99 (mg/dL)    BUN  19  6 - 23 (mg/dL) DELTA CHECK NOTED   Creatinine, Ser 3.08 (*) 0.50 - 1.35 (mg/dL) DELTA CHECK NOTED   Calcium 7.5 (*) 8.4 - 10.5 (mg/dL)    Phosphorus 1.5 (*) 2.3 - 4.6 (mg/dL)    Albumin 1.6 (*) 3.5 - 5.2 (g/dL)  GFR calc non Af Amer 18 (*) >90 (mL/min)    GFR calc Af Amer 20 (*) >90 (mL/min)   CBC     Status: Abnormal   Collection Time   12/17/10  4:06 AM      Component Value Range Comment   WBC 6.8  4.0 - 10.5 (K/uL)    RBC 3.02 (*) 4.22 - 5.81 (MIL/uL)    Hemoglobin 9.0 (*) 13.0 - 17.0 (g/dL)    HCT 16.1 (*) 09.6 - 52.0 (%)    MCV 87.7  78.0 - 100.0 (fL)    MCH 29.8  26.0 - 34.0 (pg)    MCHC 34.0  30.0 - 36.0 (g/dL)    RDW 04.5 (*) 40.9 - 15.5 (%)    Platelets 53 (*) 150 - 400 (K/uL) CONSISTENT WITH PREVIOUS RESULT  MAGNESIUM     Status: Normal   Collection Time   12/17/10  4:06 AM      Component Value Range Comment   Magnesium 1.6  1.5 - 2.5 (mg/dL)   POCT I-STAT 3, BLOOD GAS (G3+)     Status: Abnormal   Collection Time   12/17/10  4:38 AM      Component Value Range Comment   pH, Arterial 7.518 (*) 7.350 - 7.450     pCO2 arterial 34.3 (*) 35.0 - 45.0 (mmHg)    pO2, Arterial 96.0  80.0 - 100.0 (mmHg)    Bicarbonate 27.8 (*) 20.0 - 24.0 (mEq/L)    TCO2 29  0 - 100 (mmol/L)    O2 Saturation 98.0      Acid-Base Excess 5.0 (*) 0.0 - 2.0 (mmol/L)    Patient temperature 98.6 F      Collection site ARTERIAL LINE      Drawn by RT      Sample type ARTERIAL     GLUCOSE, CAPILLARY     Status: Abnormal   Collection Time   12/17/10  7:48 AM      Component Value Range Comment   Glucose-Capillary 153 (*) 70 - 99 (mg/dL)    Comment 1 Notify RN      Comment 2 Documented in Chart     GLUCOSE, CAPILLARY     Status: Abnormal   Collection Time   12/17/10 12:12 PM      Component Value Range Comment   Glucose-Capillary 169 (*) 70 - 99 (mg/dL)   GLUCOSE, CAPILLARY     Status: Abnormal   Collection Time   12/17/10  4:17 PM      Component Value Range Comment   Glucose-Capillary  134 (*) 70 - 99 (mg/dL)   GLUCOSE, CAPILLARY     Status: Abnormal   Collection Time   12/17/10  7:48 PM      Component Value Range Comment   Glucose-Capillary 123 (*) 70 - 99 (mg/dL)   GLUCOSE, CAPILLARY     Status: Abnormal   Collection Time   12/17/10  8:50 PM      Component Value Range Comment   Glucose-Capillary 129 (*) 70 - 99 (mg/dL)   GLUCOSE, CAPILLARY     Status: Abnormal   Collection Time   12/18/10 12:03 AM      Component Value Range Comment   Glucose-Capillary 119 (*) 70 - 99 (mg/dL)   GLUCOSE, CAPILLARY     Status: Abnormal   Collection Time   12/18/10  3:29 AM      Component Value Range Comment   Glucose-Capillary 133 (*) 70 - 99 (mg/dL)  POCT I-STAT 3, BLOOD GAS (G3+)     Status: Abnormal   Collection Time   12/18/10  3:29 AM      Component Value Range Comment   pH, Arterial 7.413  7.350 - 7.450     pCO2 arterial 39.0  35.0 - 45.0 (mmHg)    pO2, Arterial 117.0 (*) 80.0 - 100.0 (mmHg)    Bicarbonate 25.0 (*) 20.0 - 24.0 (mEq/L)    TCO2 26  0 - 100 (mmol/L)    O2 Saturation 99.0      Patient temperature 98.2 F      Collection site ARTERIAL LINE      Drawn by RT      Sample type ARTERIAL     CBC     Status: Abnormal   Collection Time   12/18/10  3:41 AM      Component Value Range Comment   WBC 5.8  4.0 - 10.5 (K/uL)    RBC 3.10 (*) 4.22 - 5.81 (MIL/uL)    Hemoglobin 9.3 (*) 13.0 - 17.0 (g/dL)    HCT 04.5 (*) 40.9 - 52.0 (%)    MCV 86.5  78.0 - 100.0 (fL)    MCH 30.0  26.0 - 34.0 (pg)    MCHC 34.7  30.0 - 36.0 (g/dL)    RDW 81.1 (*) 91.4 - 15.5 (%)    Platelets 34 (*) 150 - 400 (K/uL) CONSISTENT WITH PREVIOUS RESULT  BASIC METABOLIC PANEL     Status: Abnormal   Collection Time   12/18/10  3:41 AM      Component Value Range Comment   Sodium 121 (*) 135 - 145 (mEq/L) DELTA CHECK NOTED   Potassium 3.3 (*) 3.5 - 5.1 (mEq/L)    Chloride 88 (*) 96 - 112 (mEq/L)    CO2 23  19 - 32 (mEq/L)    Glucose, Bld 131 (*) 70 - 99 (mg/dL)    BUN 27 (*) 6 - 23  (mg/dL)    Creatinine, Ser 7.82 (*) 0.50 - 1.35 (mg/dL)    Calcium 7.5 (*) 8.4 - 10.5 (mg/dL)    GFR calc non Af Amer 13 (*) >90 (mL/min)    GFR calc Af Amer 15 (*) >90 (mL/min)   MAGNESIUM     Status: Normal   Collection Time   12/18/10  3:41 AM      Component Value Range Comment   Magnesium 2.0  1.5 - 2.5 (mg/dL)   PHOSPHORUS     Status: Normal   Collection Time   12/18/10  3:41 AM      Component Value Range Comment   Phosphorus 3.1  2.3 - 4.6 (mg/dL)   GLUCOSE, CAPILLARY     Status: Abnormal   Collection Time   12/18/10  7:45 AM      Component Value Range Comment   Glucose-Capillary 136 (*) 70 - 99 (mg/dL)   GLUCOSE, CAPILLARY     Status: Normal   Collection Time   12/18/10 11:55 AM      Component Value Range Comment   Glucose-Capillary 90  70 - 99 (mg/dL)     Dg Chest Port 1 View  12/18/2010  *RADIOLOGY REPORT*  Clinical Data: Evaluate endotracheal tube position  PORTABLE CHEST - 1 VIEW  Comparison: Portable chest x-ray of 12/17/2010  Findings: The tip of the endotracheal tube is approximately 8-0.6 cm above the carina.  There is evidence of pulmonary vascular congestion present with probable small effusions.  Cardiomegaly is stable.  Left and  right central venous lines remain.  IMPRESSION:  1.  Endotracheal tip approximately 2.6 cm above the carina. 2.  Little change in probable mild pulmonary vascular congestion and small effusions.  Original Report Authenticated By: Juline Patch, M.D.   Dg Chest Port 1 View  12/17/2010  *RADIOLOGY REPORT*  Clinical Data: ET tube placement, shortness of breath.  PORTABLE CHEST - 1 VIEW  Comparison: None.  Findings: Diffuse interstitial prominence throughout the lungs, likely mild edema.  Heart is borderline in size.  There is bibasilar atelectasis or infiltrates, increased since prior study.  IMPRESSION: Increasing bibasilar atelectasis or infiltrates.  Increasing interstitial prominence, likely interstitial edema.  Original Report  Authenticated By: Cyndie Chime, M.D.   Dg Chest Port 1 View  12/16/2010  *RADIOLOGY REPORT*  Clinical Data: Query aspiration.  PORTABLE CHEST - 1 VIEW  Comparison: 12/16/2010 at 5:33 a.m.  Findings: The endotracheal tube tip is approximately 2 cm above the carina.  Nasogastric tube is in place.  A right internal jugular central venous catheter is noted with tip at the cavoatrial junction.  Left central line tip projects over the SVC.  Airspace opacity noted in the left lower lobe.  There is hazy opacity along the right hemithorax but with improved aeration of the right lower lobe.  IMPRESSION: 1.  Improved aeration of the right lower lobe, with hazy opacity over the right hemithorax which could reflect a layering pleural effusion.  2.  Continued airspace opacity in the left lower lobe, possibly pneumonia or atelectasis. Aspiration pneumonitis cannot be excluded, although the endotracheal tube may have a protective effect against new aspiration.  3.  The endotracheal tube has been retracted and its tip is 2 cm above the carina.  Original Report Authenticated By: Dellia Cloud, M.D.    Assessment/Plan:  Patient Active Hospital Problem List: Cardiopulmonary arrest (12/15/2010)   Assessment: Patient has not regained baseline mental status but has shown some improvement over the past 24-48 hours although minimal.  Patient does not satisfy criteria for brain death. Although the odds are not in this patient's favor for full functional recovery based on the history available in the record, in the face of the recent clinical changes in the patient more time should be given before final determinations are made.  Patient will plateau at some point and this will likely signify what his new functional abilities will be.  Case discussed at length with the daughter who is requesting a family meeting at 4pm today.   Plan: Re-evaluate in 24-48 hours with support to be continued for that period of time.  Further  determination will be made at time.   Thana Farr, MD [facility] [phone] 12/18/2010, 2:17 PM

## 2010-12-18 NOTE — Progress Notes (Signed)
Subjective: Patient is sedated and intubated, overnight had a short run of NSVT, with no other major events except for fever spike, and hypoglycemic episode, patients mental status may be recovering given that the patient is now withdrawing from pain intermittently, and per Nursing report was responsive to basic commands overnight.    Objective: Vital signs in last 24 hours: Filed Vitals:   12/18/10 0320 12/18/10 0400 12/18/10 0500 12/18/10 0600  BP: 165/81 113/66 116/77 127/79  Pulse: 91 88 81 81  Temp:  98.4 F (36.9 C)    TempSrc:  Oral    Resp:  13 11 11   Height:      Weight:  167 lb 15.9 oz (76.2 kg)    SpO2: 100% 100% 100% 100%   Weight change: 10 lb 9.3 oz (4.8 kg)  Intake/Output Summary (Last 24 hours) at 12/18/10 0704 Last data filed at 12/18/10 0600  Gross per 24 hour  Intake 3681.26 ml  Output      0 ml  Net 3681.26 ml   Physical Exam: General: Vital signs reviewed and noted. Intubated, not responding to commands or painful stimuli. No purposeful movements.  Eyes: Pupils fixed and constricted. No signs of anemia or jaundince.  Throat: Oropharynx nonerythematous, mildly dry.  Neck: No deformities, masses, or tenderness noted.Supple, No carotid Bruits, no JVD.  Lungs: Normal respiratory effort. Diffuse rhonchi, Basilar crackles. Prolonged expiration  Heart: RRR. S1 and S2 normal without gallop, murmur, or rubs.  Abdomen: BS normoactive. Soft, Nondistended, non-tender. No masses or organomegaly.  Extremities: no edema in LE, with mild dependant edema in UE.   Lab Results: Basic Metabolic Panel:  Lab 12/18/10 4782 12/17/10 0406  NA 121* 130*  K 3.3* 3.2*  CL 88* 97  CO2 23 25  GLUCOSE 131* 133*  BUN 27* 19  CREATININE 4.02* 3.08*  CALCIUM 7.5* 7.5*  MG 2.0 1.6  PHOS 3.1 1.5*   Liver Function Tests:  Lab 12/17/10 0406 12/16/10 0700 12/15/10 0400  AST -- 27 52*  ALT -- 22 36  ALKPHOS -- 137* 131*  BILITOT -- 0.5 0.5  PROT -- 5.1* 5.2*  ALBUMIN 1.6*  1.8* --   CBC:  Lab 12/18/10 0341 12/17/10 0406 11/26/2010 1257  WBC 5.8 6.8 --  NEUTROABS -- -- 4.3  HGB 9.3* 9.0* --  HCT 26.8* 26.5* --  MCV 86.5 87.7 --  PLT 34* 53* --   Cardiac Enzymes:  Lab 12/15/10 0654 12/14/10 0500 12/14/10  CKTOTAL 29 77 79  CKMB 4.5* 7.3* 6.5*  CKMBINDEX -- -- --  TROPONINI 0.35* 0.75* 0.90*   CBG:  Lab 12/18/10 0329 12/18/10 0003 12/17/10 2050 12/17/10 1948 12/17/10 1617 12/17/10 1212  GLUCAP 133* 119* 129* 123* 134* 169*   Coagulation:  Lab 12/15/10 0400 12/14/10 0001 12/07/2010 1600 11/20/2010 1257  LABPROT 20.4* 22.2* 19.3* 20.4*  INR 1.71* 1.91* 1.60* 1.71*    Micro Results: Blood cultures> negative x2 at 24h.   Studies/Results: Dg Chest Port 1 View  12/17/2010  *RADIOLOGY REPORT*  Clinical Data: ET tube placement, shortness of breath.  PORTABLE CHEST - 1 VIEW  Comparison: None.  Findings: Diffuse interstitial prominence throughout the lungs, likely mild edema.  Heart is borderline in size.  There is bibasilar atelectasis or infiltrates, increased since prior study.  IMPRESSION: Increasing bibasilar atelectasis or infiltrates.  Increasing interstitial prominence, likely interstitial edema.  Original Report Authenticated By: Cyndie Chime, M.D.   Dg Chest Port 1 View  12/16/2010  *RADIOLOGY REPORT*  Clinical Data: Query aspiration.  PORTABLE CHEST - 1 VIEW  Comparison: 12/16/2010 at 5:33 a.m.  Findings: The endotracheal tube tip is approximately 2 cm above the carina.  Nasogastric tube is in place.  A right internal jugular central venous catheter is noted with tip at the cavoatrial junction.  Left central line tip projects over the SVC.  Airspace opacity noted in the left lower lobe.  There is hazy opacity along the right hemithorax but with improved aeration of the right lower lobe.  IMPRESSION: 1.  Improved aeration of the right lower lobe, with hazy opacity over the right hemithorax which could reflect a layering pleural effusion.  2.   Continued airspace opacity in the left lower lobe, possibly pneumonia or atelectasis. Aspiration pneumonitis cannot be excluded, although the endotracheal tube may have a protective effect against new aspiration.  3.  The endotracheal tube has been retracted and its tip is 2 cm above the carina.  Original Report Authenticated By: Dellia Cloud, M.D.   Medications:  Scheduled Meds:   . antiseptic oral rinse  1 application Mouth Rinse QID  . chlorhexidine  15 mL Mouth/Throat BID  . levalbuterol  0.63 mg Nebulization QID  . magnesium sulfate IVPB  2 g Intravenous Once  . pantoprazole sodium  40 mg Per Tube Q24H  . piperacillin-tazobactam (ZOSYN)  IV  2.25 g Intravenous Q8H  . potassium phosphate IVPB (mmol)  20 mmol Intravenous Once  . sodium chloride  500 mL Intravenous Once  . DISCONTD: feeding supplement  30 mL Per Tube BID  . DISCONTD: insulin aspart  0-9 Units Subcutaneous Q4H  . DISCONTD: magnesium  2 g Intravenous Once  . DISCONTD: potassium chloride  40 mEq Oral Daily   Continuous Infusions:   . sodium chloride Stopped (12/18/10 0544)  . dextrose 125 mL/hr at 12/18/10 0226  . DISCONTD: norepinephrine (LEVOPHED) Adult infusion Stopped (12/17/10 1000)   PRN Meds:.fentaNYL, levalbuterol, metoprolol, midazolam Assessment/Plan: 77 year old male with history of VIFB arrest in 2010, chronic systolic CHF EF 35%, COPD, ESRD, s/p cardiac arrest at home post dialysis on 11/24, who underwent cooling protocol 11/24-11/25, and has not awakened since despite d/c of sedation, overnight 11/29 patient was reported by overnight nursing staff to respond to simple commands and withdraw from pain, unfortunately this morning patient is not responding to commands or withdrawing from pain, but is displaying basic autonomic reflexes.  Ventricular Fibrillation Arrest - The cause of this patient's cardiopulmonary arrest is not completely evident at this time. May have been precipitated by marked  hypokalemia, with admission potassium notably 2.6. May also be more prone to ventricular arrhythmias at baseline given his significant cardiomyopathy. Note that pt had mild troponin elevation on admission (which has been slowly downtrending 0.98 --> 0.35) This likely reflects resuscitative efforts. Note that patient was not considered a candidate in the past for an ICD given co-morbidities and advanced age. --We will consider further invasive cardiac evaluation for his VFIB arrest only after patients mental status improves.  --Patient remains volume overloaded, restrict fluids as much as possible and continue HD/CVVHD.  --Maintain hemoglobin at goal of 8, given Ischemic CHF.  --Given elevated BP add metoprolol 12.5 qid.   LOS: 5 days   Jackson [name],Jackson [name] 12/18/2010, 7:04 AM  History reviewed with the patient, no changes to be made. Opens eyes but non purposeful.  Coarse breath sounds The patient exam reveals as above.  All available labs, radiology testing, previous records reviewed. Agree with documented assessment and  plan. Conservative management pending neuro status.  Consider neuro consult.  Add low dose beta blocker.  Fayrene Fearing [facility]  8:24 AM 12/05/2010

## 2010-12-19 ENCOUNTER — Inpatient Hospital Stay (HOSPITAL_COMMUNITY): Payer: Medicare Other

## 2010-12-19 LAB — BASIC METABOLIC PANEL
CO2: 24 mEq/L (ref 19–32)
Chloride: 95 mEq/L — ABNORMAL LOW (ref 96–112)
Creatinine, Ser: 3.07 mg/dL — ABNORMAL HIGH (ref 0.50–1.35)
Glucose, Bld: 113 mg/dL — ABNORMAL HIGH (ref 70–99)
Sodium: 128 mEq/L — ABNORMAL LOW (ref 135–145)

## 2010-12-19 LAB — PHOSPHORUS: Phosphorus: 2.7 mg/dL (ref 2.3–4.6)

## 2010-12-19 LAB — CBC
Hemoglobin: 8.7 g/dL — ABNORMAL LOW (ref 13.0–17.0)
MCV: 86.6 fL (ref 78.0–100.0)
Platelets: 34 10*3/uL — ABNORMAL LOW (ref 150–400)
RBC: 2.9 MIL/uL — ABNORMAL LOW (ref 4.22–5.81)
WBC: 4.2 10*3/uL (ref 4.0–10.5)

## 2010-12-19 LAB — POCT I-STAT 3, ART BLOOD GAS (G3+)
Acid-Base Excess: 2 mmol/L (ref 0.0–2.0)
Bicarbonate: 25.7 mEq/L — ABNORMAL HIGH (ref 20.0–24.0)
O2 Saturation: 98 %
Patient temperature: 100.5
TCO2: 27 mmol/L (ref 0–100)

## 2010-12-19 LAB — MAGNESIUM: Magnesium: 2 mg/dL (ref 1.5–2.5)

## 2010-12-19 MED ORDER — HEPARIN SODIUM (PORCINE) 1000 UNIT/ML DIALYSIS: 20.0000 [IU]/kg | INTRAMUSCULAR | Status: DC | PRN

## 2010-12-19 MED ORDER — PRO-STAT SUGAR FREE PO LIQD
30.0000 mL | Freq: Two times a day (BID) | ORAL | Status: DC
  Administered 2010-12-19 – 2010-12-25 (×14): 30 mL
  Filled 2010-12-19 (×16): qty 30

## 2010-12-19 MED ORDER — NEPRO/CARBSTEADY PO LIQD
1000.0000 mL | ORAL | Status: DC
  Administered 2010-12-19 – 2010-12-25 (×5): 1000 mL
  Filled 2010-12-19 (×11): qty 1000

## 2010-12-19 NOTE — Progress Notes (Signed)
Nursing note:   During bath, patient raised both arms up to face ( right arm with more ROM & strength; left arm very weak) when face washed and mouth care done.  Eyes opened; marked facial grimacing.  Event witnessed by 2 other RN's.  Patient would inconsistently open eyes when name called loudly.  Spontaneous movement of right arm & right leg throughout the night. Very little spontaneous movement of left side.  Painful stimuli required to elicit left arm movement except for the one time left arm localized when mouth care provided. Patient did not clearly follow any commands during the night.

## 2010-12-19 NOTE — Progress Notes (Signed)
Subjective: Patient seems to be slowly improving.  Today able to follow some simple commands.  No complaints.  Remains ventilated.  Objective: Vital signs in last 24 hours: Temp:  [97.6 F (36.4 C)-100.5 F (38.1 C)] 97.6 F (36.4 C) (11/30 1545) Pulse Rate:  [75-105] 84  (11/30 1500) Resp:  [12-38] 17  (11/30 1500) BP: (98-170)/(50-93) 133/85 mmHg (11/30 1300) SpO2:  [94 %-100 %] 100 % (11/30 1557) Arterial Line BP: (103-198)/(47-116) 144/116 mmHg (11/30 1500) FiO2 (%):  [29.7 %-30.5 %] 30 % (11/30 1557) Weight:  [74.3 kg (163 lb 12.8 oz)-74.8 kg (164 lb 14.5 oz)] 164 lb 14.5 oz (74.8 kg) (11/30 0600)  Intake/Output from previous day: 11/29 0701 - 11/30 0700 In: 1935 [I.V.:1455; NG/GT:180; IV Piggyback:300] Out: 2950  Intake/Output this shift: Total I/O In: 390 [I.V.:340; IV Piggyback:50] Out: -  Nutritional status:    Neurologic Exam: Mental Status:  Patient does not respond to verbal stimuli. With deep sternal rub again seems to localize to pain and seems to make an attempt to push my arm away with his RUE and turns his head toward me; eyes open but he makes no attempts at speech. Does not track visually.  Follows simple commands such as sticking out his tongue and moving each lower extremity to command.  Cranial Nerves:  II: patient does not respond to confrontation bilaterally, pupils right 3 mm, left 3 mm,and reactive bilaterally  III,IV,VI: doll's response present bilaterally.  V,VII: corneal reflex absent on the left  VIII: grossly intact IX,X: gag reflex unable to test. Patient intubated.  XI: trapezius strength unable to test bilaterally  XII: midline tongue extension Motor:  Lifts both arms against gravity (right greater than left) Moves both lower extremities in the bed but does not lift them off the bed.  Sensory:  Responds to noxious stimuli in all extremities. Pulls both arms up to his chest. Withdraws legs to painful stimuli.  Deep Tendon Reflexes:  Absent  throughout.  Plantars:  upgoing bilaterally  Cerebellar:  Unable to perform   Lab Results:  Basename 12/19/10 0355 12/18/10 2015 12/18/10 0341  WBC 4.2 -- 5.8  HGB 8.7* -- 9.3*  HCT 25.1* -- 26.8*  PLT 34* -- 34*  NA 128* -- 121*  K 4.3 -- 3.3*  CL 95* -- 88*  CO2 24 -- 23  GLUCOSE 113* 94 --  BUN 17 -- 27*  CREATININE 3.07* -- 4.02*  CALCIUM 7.6* -- 7.5*  LABA1C -- -- --   Studies/Results: Dg Chest Port 1 View  12/19/2010  *RADIOLOGY REPORT*  Clinical Data: Assess endotracheal tube.  PORTABLE CHEST - 1 VIEW  Comparison: 12/18/2010.  Findings: Endotracheal tube tip 4 cm above the carina.  Right sided split catheter with tips in the region of the distal superior vena cava.  Left central line tip mid superior vena cava level. Nasogastric tube courses below the diaphragm.  The tip is not included on this exam.  No gross pneumothorax.  Cardiomegaly.  Central pulmonary vascular prominence/mild pulmonary edema.  Calcified tortuous aorta.  Bibasilar patchy opacity may represent atelectasis and crowding of vessels although infectious infiltrate not excluded.  IMPRESSION: No significant change.  Original Report Authenticated By: Fuller Canada, M.D.   Dg Chest Port 1 View  12/18/2010  *RADIOLOGY REPORT*  Clinical Data: Evaluate endotracheal tube position  PORTABLE CHEST - 1 VIEW  Comparison: Portable chest x-ray of 12/17/2010  Findings: The tip of the endotracheal tube is approximately 8-0.6 cm above the carina.  There is evidence of pulmonary vascular congestion present with probable small effusions.  Cardiomegaly is stable.  Left and right central venous lines remain.  IMPRESSION:  1.  Endotracheal tip approximately 2.6 cm above the carina. 2.  Little change in probable mild pulmonary vascular congestion and small effusions.  Original Report Authenticated By: Juline Patch, M.D.    Medications:  I have reviewed the patient's current medications. Scheduled:   . antiseptic oral rinse  1  application Mouth Rinse QID  . chlorhexidine  15 mL Mouth/Throat BID  . darbepoetin (ARANESP) injection - DIALYSIS  60 mcg Intravenous Q Thu-HD  . dextrose      . feeding supplement  30 mL Per Tube BID  . fentaNYL  50 mcg Intravenous Once  . levalbuterol  0.63 mg Nebulization QID  . metoprolol tartrate  12.5 mg Oral Q6H  . pantoprazole sodium  40 mg Per Tube Q24H  . paricalcitol  2 mcg Intravenous Q T,Th,Sa-HD  . piperacillin-tazobactam (ZOSYN)  IV  2.25 g Intravenous Q8H  . vancomycin  750 mg Intravenous Once    Assessment/Plan:  Patient Active Hospital Problem List: Cardiopulmonary arrest (12/15/2010) Assessment: Patient has not regained baseline mental status but has shown some improvement over his exam from yesterday.  Patient clearly able to follow some commands today.  With continued improvement seen will continue to follow clinically. Plan: Will continue to follow with you.      LOS: 6 days   Thana Farr, MD [facility] [phone] 12/19/2010  4:14 PM

## 2010-12-19 NOTE — Progress Notes (Signed)
   Subjective:  Patient is sedated and intubated, overnight had no major events, with no other major events except for fever spike, patients mental status may be recovering given that the patient is now withdrawing from pain, and is having no purposeful movements.   Objective:  Vital Signs in the last 24 hours: Temp:  [97.1 F (36.2 C)-100.5 F (38.1 C)] 98.2 F (36.8 C) (11/30 0400) Pulse Rate:  [75-105] 75  (11/30 0700) Resp:  [11-38] 13  (11/30 0700) BP: (92-170)/(43-97) 117/73 mmHg (11/30 0700) SpO2:  [94 %-100 %] 100 % (11/30 0700) Arterial Line BP: (103-198)/(47-84) 122/61 mmHg (11/30 0700) FiO2 (%):  [29.7 %-30.5 %] 30.1 % (11/30 0700) Weight:  [163 lb 12.8 oz (74.3 kg)-167 lb 15.9 oz (76.2 kg)] 164 lb 14.5 oz (74.8 kg) (11/30 0600)  Intake/Output from previous day: 11/29 0701 - 11/30 0700 In: 1845 [I.V.:1385; NG/GT:160; IV Piggyback:300] Out: 2950   Physical Exam: General: Vital signs reviewed and noted. Intubated, not responding to commands or painful stimuli. No purposeful movements.  Eyes: Pupils fixed and constricted. No signs of anemia or jaundince.  Throat: Oropharynx nonerythematous, mildly dry.  Neck: No deformities, masses, or tenderness noted.Supple, No carotid Bruits, no JVD.  Lungs: Normal respiratory effort. Diffuse rhonchi, Basilar crackles. Prolonged expiration  Heart: RRR. S1 and S2 normal without gallop, murmur, or rubs.  Abdomen: BS normoactive. Soft, Nondistended, non-tender. No masses or organomegaly.  Extremities: no edema in LE, with mild dependant edema in UE.    Lab Results:  Basename 12/19/10 0355 12/18/10 0341  WBC 4.2 5.8  HGB 8.7* 9.3*  PLT 34* 34*    Basename 12/19/10 0355 12/18/10 2015 12/18/10 0341  NA 128* -- 121*  K 4.3 -- 3.3*  CL 95* -- 88*  CO2 24 -- 23  GLUCOSE 113* 94 --  BUN 17 -- 27*  CREATININE 3.07* -- 4.0 2*   No results found for this basename: TROPONINI:2,CK,MB:2 in the last 72 hours  Tele: No major events  overnight  Assessment/Plan:  77 year old male with history of VIFB arrest in 2010, chronic systolic CHF EF 35%, COPD, ESRD, s/p cardiac arrest at home post dialysis on 11/24, who underwent cooling protocol 11/24-11/25, and has not awakened since despite d/c of sedation, overnight 11/29 patient was reported by overnight nursing staff to respond to simple commands and withdraw from pain, unfortunately this morning patient is not responding to commands or withdrawing from pain, but is displaying basic autonomic reflexes.   Ventricular Fibrillation Arrest - The cause of this patient's cardiopulmonary arrest is not completely evident at this time. May have been precipitated by marked hypokalemia, with admission potassium notably 2.6. May also be more prone to ventricular arrhythmias at baseline given his significant cardiomyopathy. Note that pt had mild troponin elevation on admission (which has been slowly downtrending 0.98 --> 0.35) This likely reflects resuscitative efforts. Note that patient was not considered a candidate in the past for an ICD given co-morbidities and advanced age.  --We will consider further invasive cardiac evaluation for his VFIB arrest only after patients mental status improves.  --Patient remains volume overloaded, restrict fluids as much as possible and continue HD/CVVHD.  --Maintain hemoglobin at goal of 8, given Ischemic CHF.  --Continue metoprolol 12.5 qid.   Darnelle Maffucci, M.D. 12/19/2010, 7:21 AM  Patient seen, examined.  Agree with findings of P. TObbia.  Patient remains intubated, minimally responsive.  Volume overloaded.  Followed by renal service. No new recommendations.  Will follow neurologic response.

## 2010-12-19 NOTE — Progress Notes (Signed)
Nutrition Follow Up  Diet: NPO TF stopped 11/28 due to fluid overload. New consult received to restart TF.   Patient currently on CPAP.   Weight: 74.8 kg   Intake/Output Summary (Last 24 hours) at 12/19/10 1508 Last data filed at 12/19/10 1421  Gross per 24 hour  Intake   1740 ml  Output   2950 ml  Net  -1210 ml   Meds reviewed.   Labs:  CMP     Component Value Date/Time   NA 128* 12/19/2010 0355   K 4.3 12/19/2010 0355   CL 95* 12/19/2010 0355   CO2 24 12/19/2010 0355   GLUCOSE 113* 12/19/2010 0355   BUN 17 12/19/2010 0355   CREATININE 3.07* 12/19/2010 0355   CALCIUM 7.6* 12/19/2010 0355   PROT 5.1* 12/16/2010 0700   ALBUMIN 1.6* 12/17/2010 0406   AST 27 12/16/2010 0700   ALT 22 12/16/2010 0700   ALKPHOS 137* 12/16/2010 0700   BILITOT 0.5 12/16/2010 0700   GFRNONAA 18* 12/19/2010 0355   GFRAA 21* 12/19/2010 0355   Nutrition Dx: Inadequate oral intake, ongoing  Goals: TF to meet 90-100% of estimated needs, not meeting.   Monitor: TF tolerance, weight trends, labs.  Intervention:  Restart Nepro at 15 mL/hr. Increase by 10 mL every 4 hours to goal rate of 35 mL/hr with 30 mL ProStat BID. This provides 1656 kcal, 98 g protein, 611 mL free water.   Linnell Fulling, RD, LDN

## 2010-12-19 NOTE — Progress Notes (Signed)
Subjective:  Opens eyes to stimuli, looked at me and seemed to move toes on command.  HD yesterday, tolerated well, removed nearly 3 liters   Objective Vital signs in last 24 hours: Filed Vitals:   12/19/10 0400 12/19/10 0500 12/19/10 0600 12/19/10 0700  BP: 135/90 117/76  117/73  Pulse: 81 75 79 75  Temp: 98.2 F (36.8 C)   98.1 F (36.7 C)  TempSrc: Oral   Oral  Resp: 12 16 13 13   Height:      Weight:   74.8 kg (164 lb 14.5 oz)   SpO2: 100% 100% 100% 100%   Weight change: 0 kg (0 lb)  Intake/Output Summary (Last 24 hours) at 12/19/10 0842 Last data filed at 12/19/10 0700  Gross per 24 hour  Intake   1770 ml  Output   2950 ml  Net  -1180 ml   Labs: Basic Metabolic Panel:  Lab 12/19/10 8119 12/18/10 2015 12/18/10 0341 12/17/10 0406  NA 128* -- 121* 130*  K 4.3 -- 3.3* 3.2*  CL 95* -- 88* 97  CO2 24 -- 23 25  GLUCOSE 113* 94 131* --  BUN 17 -- 27* 19  CREATININE 3.07* -- 4.02* 3.08*  CALCIUM 7.6* -- 7.5* 7.5*  ALB -- -- -- --  PHOS 2.7 -- 3.1 1.5*   Liver Function Tests:  Lab 12/17/10 0406 12/16/10 0700 12/15/10 0400 12/14/10 1600  AST -- 27 52* 56*  ALT -- 22 36 31  ALKPHOS -- 137* 131* 113  BILITOT -- 0.5 0.5 0.6  PROT -- 5.1* 5.2* 4.1*  ALBUMIN 1.6* 1.8* 1.8* --   No results found for this basename: LIPASE:3,AMYLASE:3 in the last 168 hours No results found for this basename: AMMONIA:3 in the last 168 hours CBC:  Lab 12/19/10 0355 12/18/10 0341 12/17/10 0406 12/16/10 0320 12/15/10 0400 12/01/2010 1257  WBC 4.2 5.8 6.8 -- -- --  NEUTROABS -- -- -- -- -- 4.3  HGB 8.7* 9.3* 9.0* -- -- --  HCT 25.1* 26.8* 26.5* -- -- --  MCV 86.6 86.5 87.7 91.5 90.6 --  PLT 34* 34* 53* -- -- --   Cardiac Enzymes:  Lab 12/15/10 0654 12/14/10 0500 12/14/10 12/14/2010 1800  CKTOTAL 29 77 79 133  CKMB 4.5* 7.3* 6.5* 6.0*  CKMBINDEX -- -- -- --  TROPONINI 0.35* 0.75* 0.90* 0.98*   CBG:  Lab 12/19/10 0736 12/19/10 0358 12/18/10 2347 12/18/10 2000 12/18/10 1939  GLUCAP 106*  109* 103* 90 56*    Iron Studies: No results found for this basename: IRON,TIBC,TRANSFERRIN,FERRITIN in the last 72 hours Studies/Results: Dg Chest Port 1 View  12/19/2010  *RADIOLOGY REPORT*  Clinical Data: Assess endotracheal tube.  PORTABLE CHEST - 1 VIEW  Comparison: 12/18/2010.  Findings: Endotracheal tube tip 4 cm above the carina.  Right sided split catheter with tips in the region of the distal superior vena cava.  Left central line tip mid superior vena cava level. Nasogastric tube courses below the diaphragm.  The tip is not included on this exam.  No gross pneumothorax.  Cardiomegaly.  Central pulmonary vascular prominence/mild pulmonary edema.  Calcified tortuous aorta.  Bibasilar patchy opacity may represent atelectasis and crowding of vessels although infectious infiltrate not excluded.  IMPRESSION: No significant change.  Original Report Authenticated By: Fuller Canada, M.D.   Dg Chest Port 1 View  12/18/2010  *RADIOLOGY REPORT*  Clinical Data: Evaluate endotracheal tube position  PORTABLE CHEST - 1 VIEW  Comparison: Portable chest x-ray of 12/17/2010  Findings: The tip of the endotracheal tube is approximately 8-0.6 cm above the carina.  There is evidence of pulmonary vascular congestion present with probable small effusions.  Cardiomegaly is stable.  Left and right central venous lines remain.  IMPRESSION:  1.  Endotracheal tip approximately 2.6 cm above the carina. 2.  Little change in probable mild pulmonary vascular congestion and small effusions.  Original Report Authenticated By: Juline Patch, M.D.   Medications: Infusions:    . sodium chloride 20 mL/hr (12/18/10 0839)  . dextrose 40 mL/hr at 12/18/10 2349  . propofol      Scheduled Medications:    . antiseptic oral rinse  1 application Mouth Rinse QID  . chlorhexidine  15 mL Mouth/Throat BID  . darbepoetin (ARANESP) injection - DIALYSIS  60 mcg Intravenous Q Thu-HD  . dextrose      . fentaNYL  50 mcg Intravenous  Once  . levalbuterol  0.63 mg Nebulization QID  . metoprolol tartrate  12.5 mg Oral Q6H  . pantoprazole sodium  40 mg Per Tube Q24H  . paricalcitol  2 mcg Intravenous Q T,Th,Sa-HD  . piperacillin-tazobactam (ZOSYN)  IV  2.25 g Intravenous Q8H  . vancomycin  750 mg Intravenous Once  . DISCONTD: paricalcitol  2 mcg Intravenous 3 times weekly    have reviewed scheduled and prn medications.  Physical Exam: General: no sedation, opens eyes to stimuli and looked at me, moved toes to command.  Edematous Heart: RRR Lungs: fio2 of 30%, CBS bilat Abdomen: soft, nt Extremities: dependant edema  Dialysis Access: PC in place   I Assessment/ Plan: Pt is a 77 y.o. yo male who was admitted on 12/12/2010 with  Cardiac arrest at HD.  With maximal support, awaiting improvement.  Assessment/Plan: 1. S/p cardiac arrest- Some neurologic improvement now 5 days out , Neuro advises reassessment 24-48 hours, continue with full support until then 2. ESRD- usually TTS.  Will plan for HD tomorrow via PC 3. Anemia- transfuse PRN, also on aranesp 4. Secondary hyperparathyroidism- holding binders, phos is good, continue zemplar  5. HTN/volume- getting IVF but fio2 is OK. high IVFs are catching up to him, as evidenced by the sodium.  will try moderate fluid removal with HD tomorrow.  Looks like still needs d10 to keep sugar up, continue at 40/hour   6. Dispo- appears poor prognosis, will follow.  Neuro involved  Libby Goehring A   12/19/2010,8:42 AM  LOS: 6 days

## 2010-12-19 NOTE — Progress Notes (Signed)
HPI:  Jackson [name] is a 77 y.o. male admitted on 12/17/2010 with cardiac arrest after HD. Had shockable rhythm per AED applied by paramedics.  PMHx Cardiomyopathy with EF 30%, ESRD on HD T/Th/Sa, HTN, Hyperlipidemia, Anemia, Gout, TR  Antibiotics:   Zosyn 11/26>>  Vancomycin 11/26>>  Cultures/Sepsis Markers:   Sputum 11/26>>>NTD Blood 11/27>>>NTD  Access/Protocols:  11/24 R tunneled HD catheter (preadmission)  11/24 ETT>>> 11/24 OGT>>>  11/24 L [place] TLC>>>  11/25 Lt Radial [place]>>>  Best Practice: DVT: Hep SQ GI: Protonix  11/24 Cardiac arrest post HD, ACLS, hypothermia protocol started and complete.  Subjective: Rewarmed, no events overnight.  Physical Exam: Filed Vitals:   12/19/10 0900  BP: 144/83  Pulse: 79  Temp:   Resp: 15    Intake/Output Summary (Last 24 hours) at 12/19/10 1019 Last data filed at 12/19/10 0700  Gross per 24 hour  Intake   1690 ml  Output   2950 ml  Net  -1260 ml   Vent Mode:  [-] CPAP FiO2 (%):  [29.7 %-30.5 %] 29.8 % Set Rate:  [12 bmp] 12 bmp Vt Set:  [500 mL] 500 mL PEEP:  [5 cmH20] 5 cmH20 Pressure Support:  [5 cmH20-10 cmH20] 10 cmH20 Plateau Pressure:  [16 cmH20-24 cmH20] 16 cmH20  Neuro: Unresponsive. Cardiac: RRR, Nl S1/S2, -M/R/G. Pulmonary: Coarse BS diffusely. GI: Soft, NT, ND and +BS. Extremities: 2+ edema and -tenderness.  Labs: CBC    Component Value Date/Time   WBC 4.2 12/19/2010 0355   RBC 2.90* 12/19/2010 0355   HGB 8.7* 12/19/2010 0355   HCT 25.1* 12/19/2010 0355   PLT 34* 12/19/2010 0355   MCV 86.6 12/19/2010 0355   MCH 30.0 12/19/2010 0355   MCHC 34.7 12/19/2010 0355   RDW 18.0* 12/19/2010 0355   LYMPHSABS 3.4 12/07/2010 1257   MONOABS 0.4 12/18/2010 1257   EOSABS 0.3 12/11/2010 1257   BASOSABS 0.1 12/12/2010 1257   BMET    Component Value Date/Time   NA 128* 12/19/2010 0355   K 4.3 12/19/2010 0355   CL 95* 12/19/2010 0355   CO2 24 12/19/2010 0355   GLUCOSE 113* 12/19/2010 0355   BUN 17  12/19/2010 0355   CREATININE 3.07* 12/19/2010 0355   CALCIUM 7.6* 12/19/2010 0355   GFRNONAA 18* 12/19/2010 0355   GFRAA 21* 12/19/2010 0355   ABG    Component Value Date/Time   PHART 7.453* 12/19/2010 0331   PCO2ART 37.0 12/19/2010 0331   PO2ART 107.0* 12/19/2010 0331   HCO3 25.7* 12/19/2010 0331   TCO2 27 12/19/2010 0331   ACIDBASEDEF 3.0* 12/15/2010 1321   O2SAT 98.0 12/19/2010 0331    Lab 12/19/10 0355  MG 2.0   Lab Results  Component Value Date   CALCIUM 7.6* 12/19/2010   PHOS 2.7 12/19/2010    Chest Xray:   Assessment & Plan:  VDRF in setting of cardiac arrest.   - Maintain on PS as tolerated, full vent support at night then PS again in AM. - F/u CXR. - Bronchial hygiene.  Purulent bronchitis with ?PNA.  - F/U on cultures. - D5/x vancomycin and zosyn.  Shock:  Now resolved.  Actually hypertensive now. - Per family discussion no further escalation of care, CPR, cardioversion or pressors on 11/28. - Will start low dose beta blockers PO today.  Cardiac arrest with presumed VF with hx of systolic CHF and post-dialysis hypokalemia  - Rewarmed 11/26. - Add beta blockers PO today.  ESRD  - HD per renal.  Anoxic  encephalopathy: No bz since 11/26 with purposeful withdraw to pain but not to command. - Monitor daily. - Neuro following. - Will need additional discussion with family.  Anemia  - F/U CBC. - Transfuse for Hb < 7.   Thrombocytopenia, resolved. - F/U CBC.   Hypoglycemia  - Monitor CBG's.  - Restart TF today.   Best practices / Disposition  - SCDs for DVT Px  - Protonix for GI Px   Met with the family on 11/29, after discussion, decided to give patient 24-48 hours to declare himself from an improvement standpoint.  Depending on rate of improvement and level of functionality will determine plan of care.  The patient is critically ill with multiple organ systems failure and requires high complexity decision making for assessment and support,  frequent evaluation and titration of therapies, application of advanced monitoring technologies and extensive interpretation of multiple databases. Critical Care Time devoted to patient care services described in this note is 35 minutes.  Koren Bound, M.D.

## 2010-12-20 ENCOUNTER — Inpatient Hospital Stay (HOSPITAL_COMMUNITY): Payer: Medicare Other

## 2010-12-20 LAB — BLOOD GAS, ARTERIAL
Acid-base deficit: 3.2 mmol/L — ABNORMAL HIGH (ref 0.0–2.0)
Drawn by: 23588
FIO2: 0.3 %
MECHVT: 500 mL
PEEP: 5 cmH2O
RATE: 12 resp/min
pCO2 arterial: 27.4 mmHg — ABNORMAL LOW (ref 35.0–45.0)

## 2010-12-20 LAB — BASIC METABOLIC PANEL
BUN: 29 mg/dL — ABNORMAL HIGH (ref 6–23)
CO2: 23 mEq/L (ref 19–32)
Chloride: 93 mEq/L — ABNORMAL LOW (ref 96–112)
Creatinine, Ser: 3.94 mg/dL — ABNORMAL HIGH (ref 0.50–1.35)
Glucose, Bld: 113 mg/dL — ABNORMAL HIGH (ref 70–99)

## 2010-12-20 LAB — CBC
HCT: 26.4 % — ABNORMAL LOW (ref 39.0–52.0)
Hemoglobin: 9.2 g/dL — ABNORMAL LOW (ref 13.0–17.0)
MCH: 29.8 pg (ref 26.0–34.0)
MCV: 85.4 fL (ref 78.0–100.0)
Platelets: 61 10*3/uL — ABNORMAL LOW (ref 150–400)
RBC: 3.09 MIL/uL — ABNORMAL LOW (ref 4.22–5.81)
WBC: 6.1 10*3/uL (ref 4.0–10.5)

## 2010-12-20 LAB — GLUCOSE, CAPILLARY
Glucose-Capillary: 100 mg/dL — ABNORMAL HIGH (ref 70–99)
Glucose-Capillary: 102 mg/dL — ABNORMAL HIGH (ref 70–99)
Glucose-Capillary: 86 mg/dL (ref 70–99)
Glucose-Capillary: 89 mg/dL (ref 70–99)

## 2010-12-20 LAB — POCT I-STAT 3, ART BLOOD GAS (G3+)
Bicarbonate: 23.2 mEq/L (ref 20.0–24.0)
Patient temperature: 98.2
pH, Arterial: 7.451 — ABNORMAL HIGH (ref 7.350–7.450)
pO2, Arterial: 108 mmHg — ABNORMAL HIGH (ref 80.0–100.0)

## 2010-12-20 LAB — MAGNESIUM: Magnesium: 2.1 mg/dL (ref 1.5–2.5)

## 2010-12-20 MED ORDER — AMLODIPINE 1 MG/ML ORAL SUSPENSION: 5.0000 mg | Freq: Every day | ORAL | Status: DC

## 2010-12-20 MED ORDER — AMLODIPINE BESYLATE 5 MG PO TABS
5.0000 mg | ORAL_TABLET | Freq: Every day | ORAL | Status: DC
  Filled 2010-12-20 (×2): qty 1

## 2010-12-20 MED ORDER — METOPROLOL TARTRATE 25 MG PO TABS
25.0000 mg | ORAL_TABLET | Freq: Four times a day (QID) | ORAL | Status: DC
  Administered 2010-12-20 – 2011-01-02 (×45): 25 mg via ORAL
  Filled 2010-12-20 (×53): qty 1

## 2010-12-20 MED ORDER — WHITE PETROLATUM GEL
Status: AC
  Filled 2010-12-20: qty 5

## 2010-12-20 MED ORDER — AMLODIPINE 1 MG/ML ORAL SUSPENSION
5.0000 mg | Freq: Every day | ORAL | Status: DC
  Administered 2010-12-21: 5 mg via ORAL
  Filled 2010-12-20 (×2): qty 5

## 2010-12-20 NOTE — Consult Note (Signed)
Subjective:  Pt not responding currently.  Had HD today with almost 4 kg off.  Objective:    Vital signs in last 24 hours: Filed Vitals:   12/20/10 1942 12/20/10 2000 12/20/10 2100 12/20/10 2307  BP: 158/79 119/70 130/72 137/62  Pulse: 86 74 88 88  Temp:  99.9 F (37.7 C)    TempSrc:   Oral   Resp: 20 17 21 12   Height:      Weight:      SpO2: 100% 99% 100% 100%   Weight change: 4.1 kg (9 lb 0.6 oz)  Intake/Output Summary (Last 24 hours) at 12/20/10 2322 Last data filed at 12/20/10 2100  Gross per 24 hour  Intake   2030 ml  Output   3953 ml  Net  -1923 ml   Weights 67 kg on admit >> peaked at 80 kg today preHD, down to to 73.6 kg after HD  Labs: Basic Metabolic Panel:  Lab 12/20/10 1610 12/19/10 0355 12/18/10 2015 12/18/10 0341 12/17/10 0406 12/16/10 0700 12/16/10 0320 12/15/10 0440  NA 126* 128* -- 121* 130* 127* 131* 135  K 3.7 4.3 -- 3.3* 3.2* 4.2 4.2 4.4  CL 93* 95* -- 88* 97 96 100 106  CO2 23 24 -- 23 25 21 22 20   GLUCOSE 113* 113* 94 131* 133* 149* 109* --  BUN 29* 17 -- 27* 19 35* 31* 18  CREATININE 3.94* 3.07* -- 4.02* 3.08* 5.21* 4.93* 3.62*  ALB -- -- -- -- -- -- -- --  CALCIUM 7.8* 7.6* -- 7.5* 7.5* 7.7* 8.1* 7.1*  PHOS 3.0 2.7 -- 3.1 1.5* -- -- --   Liver Function Tests:  Lab 12/17/10 0406 12/16/10 0700 12/15/10 0400 12/14/10 1600  AST -- 27 52* 56*  ALT -- 22 36 31  ALKPHOS -- 137* 131* 113  BILITOT -- 0.5 0.5 0.6  PROT -- 5.1* 5.2* 4.1*  ALBUMIN 1.6* 1.8* 1.8* --   No results found for this basename: LIPASE:3,AMYLASE:3 in the last 168 hours No results found for this basename: AMMONIA:3 in the last 168 hours CBC:  Lab 12/20/10 0500 12/19/10 0355 12/18/10 0341 12/17/10 0406  WBC 6.1 4.2 5.8 6.8  NEUTROABS -- -- -- --  HGB 9.2* 8.7* 9.3* 9.0*  HCT 26.4* 25.1* 26.8* 26.5*  MCV 85.4 86.6 86.5 87.7  PLT 61* 34* 34* 53*   Cardiac Enzymes:  Lab 12/15/10 0654 12/14/10 0500 12/14/10  CKTOTAL 29 77 79  CKMB 4.5* 7.3* 6.5*  CKMBINDEX -- -- --    TROPONINI 0.35* 0.75* 0.90*   CBG:  Lab 12/20/10 1558 12/20/10 0756 12/20/10 0419 12/19/10 2355 12/19/10 1935  GLUCAP 102* 86 100* 89 78    Iron Studies: No results found for this basename: IRON:30,TIBC:30,SATURATION RATIOS:30,TRANSFERRIN:30,FERRITIN:30 in the last 168 hours Studies/Results: Dg Chest Port 1 View  12/20/2010  *RADIOLOGY REPORT*  Clinical Data: Perforation, ventilatory support, renal failure  PORTABLE CHEST - 1 VIEW  Comparison: 12/19/2010  Findings: Endotracheal tube 2.8 cm above the carina.  Bilateral central lines and NG tube stable position.  Monitor leads overlie the chest.  Heart remains enlarged with worsening diffuse pulmonary edema pattern noted.  Pleural effusions present bilaterally with basilar atelectasis / consolidation, worse in the left lower lobe. No pneumothorax.  IMPRESSION: Worsening CHF pattern.  Original Report Authenticated By: Judie Petit. Ruel Favors, M.D.   Dg Chest Port 1 View  12/19/2010  *RADIOLOGY REPORT*  Clinical Data: Assess endotracheal tube.  PORTABLE CHEST - 1 VIEW  Comparison: 12/18/2010.  Findings: Endotracheal tube tip 4 cm above the carina.  Right sided split catheter with tips in the region of the distal superior vena cava.  Left central line tip mid superior vena cava level. Nasogastric tube courses below the diaphragm.  The tip is not included on this exam.  No gross pneumothorax.  Cardiomegaly.  Central pulmonary vascular prominence/mild pulmonary edema.  Calcified tortuous aorta.  Bibasilar patchy opacity may represent atelectasis and crowding of vessels although infectious infiltrate not excluded.  IMPRESSION: No significant change.  Original Report Authenticated By: Fuller Canada, M.D.   Medications:    . sodium chloride 20 mL/hr at 12/20/10 2000  . dextrose 40 mL/hr at 12/20/10 2000  . feeding supplement (NEPRO CARB STEADY) 1,000 mL (12/19/10 1950)  . propofol        . amLODipine  5 mg Oral Daily  . amLODipine  5 mg Oral Daily  .  antiseptic oral rinse  1 application Mouth Rinse QID  . chlorhexidine  15 mL Mouth/Throat BID  . darbepoetin (ARANESP) injection - DIALYSIS  60 mcg Intravenous Q Thu-HD  . feeding supplement  30 mL Per Tube BID  . levalbuterol  0.63 mg Nebulization QID  . metoprolol tartrate  25 mg Oral Q6H  . pantoprazole sodium  40 mg Per Tube Q24H  . paricalcitol  2 mcg Intravenous Q T,Th,Sa-HD  . piperacillin-tazobactam (ZOSYN)  IV  2.25 g Intravenous Q8H  . white petrolatum      . DISCONTD: amLODipine  5 mg Oral Daily  . DISCONTD: metoprolol tartrate  12.5 mg Oral Q6H    I  have reviewed scheduled and prn medications.  Physical Exam: General: on vent, eyes rolled back, staring at ceiling, not responding to commands Heart: reg no rub or S3 Lungs: coarse BS bilat Abdomen: soft, ND Extremities: scrotal and penile edema, thigh edema bilat Dialysis Access: R IJ permcath Neuro: moves extremities  Problem/Plan: 1. ESRD- dialysis yesterday and today.  Next HD Monday.  Volume excess is improving.   2. S/P cardiac arrest - possible HIE 3. Anemia- Hb around 9, on ESA 4. Secondary hyperparathyroidism- vit D, no binders, PO4 3-4 range 5. HTN/volume- BB/CCB per tube. Will follow.  Jackson [name] D  12/20/2010,11:22 PM  LOS: 7 days  Cell #  [phone]

## 2010-12-20 NOTE — Progress Notes (Signed)
HPI:  Jackson [name] is a 77 y.o. male admitted on 12-23-2010 with cardiac arrest after HD. Had shockable rhythm per AED applied by paramedics.  PMHx Cardiomyopathy with EF 30%, ESRD on HD T/Th/Sa, HTN, Hyperlipidemia, Anemia, Gout, TR  Antibiotics:   Zosyn 11/26>>  Vancomycin 11/26>>  Cultures/Sepsis Markers:   Sputum 11/26>>>NTD Blood 11/27>>>NTD  Access/Protocols:  11/24 R tunneled HD catheter (preadmission)  11/24 ETT>>> 11/24 OGT>>>  11/24 L [place] TLC>>>  11/25 Lt Radial [place]>>>  Best Practice: DVT: Hep SQ GI: Protonix  11/24 Cardiac arrest post HD, ACLS, hypothermia protocol started and complete.  Subjective: Rewarmed, no events overnight.  Physical Exam: Filed Vitals:   12/20/10 0757  BP: 181/90  Pulse: 92  Temp: 98.1 F (36.7 C)  Resp: 20     Intake/Output Summary (Last 24 hours) at 12/20/10 1037 Last data filed at 12/20/10 0600  Gross per 24 hour  Intake   1675 ml  Output      1 ml  Net   1674 ml   Vent Mode:  [-] PRVC FiO2 (%):  [29.5 %-30.4 %] 30 % Set Rate:  [12 bmp] 12 bmp Vt Set:  [500 mL] 500 mL PEEP:  [4.7 cmH20-5 cmH20] 5 cmH20 Pressure Support:  [10 cmH20] 10 cmH20 Plateau Pressure:  [18 cmH20-34 cmH20] 34 cmH20  Neuro: Unresponsive, withdraws to pain but not following any command. Cardiac: RRR, Nl S1/S2, -M/R/G. Pulmonary: Coarse BS diffusely. GI: Soft, NT, ND and +BS. Extremities: 2+ edema and -tenderness.  Labs: CBC    Component Value Date/Time   WBC 6.1 12/20/2010 0500   RBC 3.09* 12/20/2010 0500   HGB 9.2* 12/20/2010 0500   HCT 26.4* 12/20/2010 0500   PLT 61* 12/20/2010 0500   MCV 85.4 12/20/2010 0500   MCH 29.8 12/20/2010 0500   MCHC 34.8 12/20/2010 0500   RDW 17.9* 12/20/2010 0500   LYMPHSABS 3.4 12-23-2010 1257   MONOABS 0.4 12-23-2010 1257   EOSABS 0.3 December 23, 2010 1257   BASOSABS 0.1 12/23/10 1257   BMET    Component Value Date/Time   NA 126* 12/20/2010 0500   K 3.7 12/20/2010 0500   CL 93* 12/20/2010 0500   CO2 23  12/20/2010 0500   GLUCOSE 113* 12/20/2010 0500   BUN 29* 12/20/2010 0500   CREATININE 3.94* 12/20/2010 0500   CALCIUM 7.8* 12/20/2010 0500   GFRNONAA 13* 12/20/2010 0500   GFRAA 15* 12/20/2010 0500   ABG    Component Value Date/Time   PHART 7.451* 12/20/2010 0336   PCO2ART 33.3* 12/20/2010 0336   PO2ART 108.0* 12/20/2010 0336   HCO3 23.2 12/20/2010 0336   TCO2 24 12/20/2010 0336   ACIDBASEDEF 3.2* 12/20/2010 0320   O2SAT 99.0 12/20/2010 0336    Lab 12/20/10 0500  MG 2.1   Lab Results  Component Value Date   CALCIUM 7.8* 12/20/2010   PHOS 3.0 12/20/2010    Chest Xray:   Assessment & Plan:  VDRF in setting of cardiac arrest.   - Maintain on PS as tolerated, full vent support at night then PS again in AM. - F/u CXR. - Bronchial hygiene.  Purulent bronchitis with ?PNA.  - F/U on cultures. - D6/x vancomycin and zosyn.  Shock:  Now resolved.  Actually hypertensive now. - Per family discussion no further escalation of care, CPR, cardioversion or pressors on 11/28. - Will start low dose beta blockers PO today.  Cardiac arrest with presumed VF with hx of systolic CHF and post-dialysis hypokalemia  - Rewarmed  11/26. - Continue beta blocker. - Add norvac for BP control.  ESRD  - HD per renal.  Anoxic encephalopathy: No bz since 11/26 with purposeful withdraw to pain but not to command. - Monitor daily. - Neuro following. - Will need additional discussion with family on Monday.  Anemia  - F/U CBC. - Transfuse for Hb < 7.   Thrombocytopenia, resolved. - F/U CBC.   Hypoglycemia  - Monitor CBG's.  - Restart TF today.   Best practices / Disposition  - SCDs for DVT Px  - Protonix for GI Px   Met with the family on 11/29, after discussion, decided to give patient 24-48 hours to declare himself from an improvement standpoint.  Depending on rate of improvement and level of functionality will determine plan of care.  The patient is critically ill with multiple organ systems  failure and requires high complexity decision making for assessment and support, frequent evaluation and titration of therapies, application of advanced monitoring technologies and extensive interpretation of multiple databases. Critical Care Time devoted to patient care services described in this note is 35 minutes.  Koren Bound, M.D.

## 2010-12-20 NOTE — Progress Notes (Signed)
Subjective:    Jackson [name] is an 77 year old gentleman with a history of end-stage renal disease. He was admitted to the hospital after having a cardiac arrest during dialysis.  The patient was found to have ventricular fibrillation. He was successfully cardioverted using an AED. He has a known cardiomyopathy with an ejection fraction of around 30%. He also has a history of hypertension, hyperlipidemia, anemia, and gout.  He has been basically unresponsive for the past several days. There is some question as to whether he will wake up enough to be extubated versus having a trach placed.     Marland Kitchen antiseptic oral rinse  1 application Mouth Rinse QID  . chlorhexidine  15 mL Mouth/Throat BID  . darbepoetin (ARANESP) injection - DIALYSIS  60 mcg Intravenous Q Thu-HD  . feeding supplement  30 mL Per Tube BID  . levalbuterol  0.63 mg Nebulization QID  . metoprolol tartrate  12.5 mg Oral Q6H  . pantoprazole sodium  40 mg Per Tube Q24H  . paricalcitol  2 mcg Intravenous Q T,Th,Sa-HD  . piperacillin-tazobactam (ZOSYN)  IV  2.25 g Intravenous Q8H  . white petrolatum          . sodium chloride 20 mL/hr (12/18/10 0839)  . dextrose 40 mL/hr at 12/19/10 2338  . feeding supplement (NEPRO CARB STEADY) 1,000 mL (12/19/10 1950)  . propofol      Objective:  Vital Signs in the last 24 hours: Blood pressure 140/80, pulse 89, temperature 98.1 F (36.7 C), temperature source Oral, resp. rate 13, height 5\' 11"  (1.803 m), weight 177 lb 0.5 oz (80.3 kg), SpO2 98.00%. Temp:  [97.6 F (36.4 C)-98.2 F (36.8 C)] 98.1 F (36.7 C) (12/01 0757) Pulse Rate:  [77-92] 89  (12/01 0600) Resp:  [13-22] 13  (12/01 0600) BP: (125-181)/(69-102) 140/80 mmHg (12/01 0535) SpO2:  [98 %-100 %] 98 % (12/01 0600) Arterial Line BP: (133-174)/(63-116) 165/82 mmHg (12/01 0500) FiO2 (%):  [29.5 %-30.4 %] 30.1 % (12/01 0600) Weight:  [177 lb 0.5 oz (80.3 kg)] 177 lb 0.5 oz (80.3 kg) (12/01 0500)  Intake/Output from  previous day: 11/30 0701 - 12/01 0700 In: 1815 [I.V.:1260; NG/GT:455; IV Piggyback:100] Out: 1 [Stool:1] Intake/Output from this shift:    Physical Exam: The patient remains on that. He was unresponsive.  HEENT:   the sclera are nonicteric. .  The carotids are 2+ without bruits.  There is no thyromegaly.     Lungs: He has extensive wheezes and congestion.  There is a left subclavian line in place.  Heart: Irregularly irregular.  Abdomen: good bowel sounds.  There is no guarding or rebound.  There is no hepatosplenomegaly or tenderness.  There are no masses.   Extremities:  He has diffuse edema.  Neuro:  The patient is unresponsive.   Lab Results:  Basename 12/20/10 0500 12/19/10 0355  WBC 6.1 4.2  HGB 9.2* 8.7*  PLT 61* 34*    Basename 12/20/10 0500 12/19/10 0355  NA 126* 128*  K 3.7 4.3  CL 93* 95*  CO2 23 24  GLUCOSE 113* 113*  BUN 29* 17  CREATININE 3.94* 3.07*   No results found for this basename: TROPONINI:2,CK,MB:2 in the last 72 hours No results found for this basename: BNP in the last 72 hours Hepatic Function Panel No results found for this basename: PROT,ALBUMIN,AST,ALT,ALKPHOS,BILITOT,BILIDIR,IBILI in the last 72 hours Lab Results  Component Value Date   CHOL  Value: 146        ATP III  CLASSIFICATION:  <200     mg/dL   Desirable  086-578  mg/dL   Borderline High  >=469    mg/dL   High        62/95/2841   HDL 54 11/03/2009   LDLCALC  Value: 77        Total Cholesterol/HDL:CHD Risk Coronary Heart Disease Risk Table                     Men   Women  1/2 Average Risk   3.4   3.3  Average Risk       5.0   4.4  2 X Average Risk   9.6   7.1  3 X Average Risk  23.4   11.0        Use the calculated Patient Ratio above and the CHD Risk Table to determine the patient's CHD Risk.        ATP III CLASSIFICATION (LDL):  <100     mg/dL   Optimal  324-401  mg/dL   Near or Above                    Optimal  130-159  mg/dL   Borderline  027-253  mg/dL   High  >664     mg/dL    Very High 40/34/7425   TRIG 76 11/03/2009   CHOLHDL 2.7 11/03/2009   No results found for this basename: INR in the last 72 hours  Telemetry: The patient is in atrial fibrillation.  Assessment/Plan:   1.  ventricular fibrillation cardiac arrest : the patient is now in atrial fibrillation. Unfortunately he is unresponsive.    2. Hypertension:  The patient's blood pressure remains mildly elevated. We will increase his metoprolol to 25 mg every 6 hours.    Disposition: The patient is currently unresponsive. There is some discussion about whether or not to extubate him versus placing a tracheostomy.  No further cardiac plans at this time.  Vesta Mixer, Montez Hageman., MD, [facility] 12/20/2010, 8:20 AM LOS: Day 7

## 2010-12-20 DEATH — deceased

## 2010-12-21 ENCOUNTER — Inpatient Hospital Stay (HOSPITAL_COMMUNITY): Payer: Medicare Other

## 2010-12-21 DIAGNOSIS — N186 End stage renal disease: Secondary | ICD-10-CM

## 2010-12-21 DIAGNOSIS — J9600 Acute respiratory failure, unspecified whether with hypoxia or hypercapnia: Secondary | ICD-10-CM

## 2010-12-21 DIAGNOSIS — I4901 Ventricular fibrillation: Secondary | ICD-10-CM

## 2010-12-21 LAB — BLOOD GAS, ARTERIAL
Bicarbonate: 26.2 mEq/L — ABNORMAL HIGH (ref 20.0–24.0)
MECHVT: 500 mL
O2 Saturation: 98.4 %
PEEP: 5 cmH2O
Patient temperature: 98.6
TCO2: 27.3 mmol/L (ref 0–100)

## 2010-12-21 LAB — BASIC METABOLIC PANEL
CO2: 24 mEq/L (ref 19–32)
Chloride: 96 mEq/L (ref 96–112)
Creatinine, Ser: 2.63 mg/dL — ABNORMAL HIGH (ref 0.50–1.35)
Glucose, Bld: 105 mg/dL — ABNORMAL HIGH (ref 70–99)

## 2010-12-21 LAB — GLUCOSE, CAPILLARY
Glucose-Capillary: 104 mg/dL — ABNORMAL HIGH (ref 70–99)
Glucose-Capillary: 104 mg/dL — ABNORMAL HIGH (ref 70–99)
Glucose-Capillary: 119 mg/dL — ABNORMAL HIGH (ref 70–99)

## 2010-12-21 LAB — CBC
HCT: 25 % — ABNORMAL LOW (ref 39.0–52.0)
Hemoglobin: 8.7 g/dL — ABNORMAL LOW (ref 13.0–17.0)
MCV: 85.6 fL (ref 78.0–100.0)
Platelets: 171 10*3/uL (ref 150–400)
RBC: 2.92 MIL/uL — ABNORMAL LOW (ref 4.22–5.81)
WBC: 7.6 10*3/uL (ref 4.0–10.5)

## 2010-12-21 LAB — MAGNESIUM: Magnesium: 1.9 mg/dL (ref 1.5–2.5)

## 2010-12-21 MED ORDER — HYDRALAZINE HCL 20 MG/ML IJ SOLN
10.0000 mg | INTRAMUSCULAR | Status: DC | PRN
  Administered 2010-12-21: 10 mg via INTRAVENOUS
  Filled 2010-12-21 (×3): qty 1

## 2010-12-21 MED ORDER — AMLODIPINE 1 MG/ML ORAL SUSPENSION
10.0000 mg | Freq: Every day | ORAL | Status: DC
  Filled 2010-12-21: qty 10

## 2010-12-21 MED ORDER — HEPARIN SODIUM (PORCINE) 1000 UNIT/ML DIALYSIS: 20.0000 [IU]/kg | INTRAMUSCULAR | Status: DC | PRN

## 2010-12-21 NOTE — Progress Notes (Signed)
Subjective: Patient remains intubated. On no sedation.  Objective: Vital signs: Temp:  98.5 F Pulse Rate:  108 Resp:  18 BP: 164/101 SpO2:  100 % FiO2 (%):  30 % Weight:  [73.6 kg (162 lb 4.1 oz)-76.8 kg (169 lb 5 oz)] 162 lb 4.1 oz (73.6 kg) (12/01 1813)  Intake/Output: 12/01 0701 - 12/02 0700 In: 1600 [I.V.:760; NG/GT:840] Out: 3952    Nutritional status:    Neurologic Exam: Mental Status:  Patient opens eyes to verbal stimuli. With deep sternal rub again seems to localize to pain and seems to make an attempt to push my arm away with his RUE and turns his head toward me; Reaches outward with both upper extremities (R>L).  Does not track visually. Follows simple commands such as sticking out his tongue and moving each lower extremity to command.  Cranial Nerves:  II: patient does not respond to confrontation bilaterally, pupils right 3 mm, left 3 mm,and reactive bilaterally  III,IV,VI: doll's response present bilaterally.  V,VII: corneal reflex absent on the left  VIII: grossly intact  IX,X: gag reflex unable to test. Patient intubated.  XI: trapezius strength unable to test bilaterally  XII: midline tongue extension  Motor:  Lifts both arms against gravity (right greater than left) Moves both lower extremities in the bed but does not lift them off the bed.  Sensory:  Responds to noxious stimuli in all extremities. Pulls both arms up to his chest. Withdraws legs to painful stimuli.  Deep Tendon Reflexes:  Absent throughout.  Plantars:  upgoing bilaterally  Cerebellar:  Unable to perform   Lab Results:  Basename  12/20/10 0500  WBC  6.1  HGB  9.2*  HCT  26.4*  PLT  61*  NA  126*  K  3.7  CL  93*  CO2  23  GLUCOSE  113*  BUN  29*  CREATININE  3.94*  CALCIUM  7.8*  LABA1C  --    Studies/Results: Dg Chest Port 1 View  12/20/2010  *RADIOLOGY REPORT*  Clinical Data: Perforation, ventilatory support, renal failure  PORTABLE CHEST - 1 VIEW  Comparison:  12/19/2010  Findings: Endotracheal tube 2.8 cm above the carina.  Bilateral central lines and NG tube stable position.  Monitor leads overlie the chest.  Heart remains enlarged with worsening diffuse pulmonary edema pattern noted.  Pleural effusions present bilaterally with basilar atelectasis / consolidation, worse in the left lower lobe. No pneumothorax.  IMPRESSION: Worsening CHF pattern.  Original Report Authenticated By: Judie Petit. Ruel Favors, M.D.    Medications:  I have reviewed the patient's current medications. Scheduled:   . amLODipine  5 mg Oral Daily  . amLODipine  5 mg Oral Daily  . antiseptic oral rinse  1 application Mouth Rinse QID  . chlorhexidine  15 mL Mouth/Throat BID  . darbepoetin (ARANESP) injection - DIALYSIS  60 mcg Intravenous Q Thu-HD  . feeding supplement  30 mL Per Tube BID  . levalbuterol  0.63 mg Nebulization QID  . metoprolol tartrate  25 mg Oral Q6H  . pantoprazole sodium  40 mg Per Tube Q24H  . paricalcitol  2 mcg Intravenous Q T,Th,Sa-HD  . piperacillin-tazobactam (ZOSYN)  IV  2.25 g Intravenous Q8H  . DISCONTD: amLODipine  5 mg Oral Daily  . DISCONTD: metoprolol tartrate  12.5 mg Oral Q6H    Assessment/Plan:  Patient Active Hospital Problem List: Cardiopulmonary arrest (12/15/2010)   Assessment: Patient continues to make gains on a daily basis, although small. With continued improvement  seen will continue to follow clinically.   Plan: Will continue to follow with you.     LOS: 7 days   Thana Farr, MD [facility] [phone] 12/20/2010  7:14 PM

## 2010-12-21 NOTE — Progress Notes (Signed)
Subjective:    Mr. Charm Barges is an 77 year old gentleman with a history of end-stage renal disease. He was admitted to the hospital after having a cardiac arrest during dialysis.  The patient was found to have ventricular fibrillation. He was successfully cardioverted using an AED. He has a known cardiomyopathy with an ejection fraction of around 30%. He also has a history of hypertension, hyperlipidemia, anemia, and gout.  He is waking up some and following commands.  He is on pressure support.     Marland Kitchen amLODipine  5 mg Oral Daily  . amLODipine  5 mg Oral Daily  . antiseptic oral rinse  1 application Mouth Rinse QID  . chlorhexidine  15 mL Mouth/Throat BID  . darbepoetin (ARANESP) injection - DIALYSIS  60 mcg Intravenous Q Thu-HD  . feeding supplement  30 mL Per Tube BID  . levalbuterol  0.63 mg Nebulization QID  . metoprolol tartrate  25 mg Oral Q6H  . pantoprazole sodium  40 mg Per Tube Q24H  . paricalcitol  2 mcg Intravenous Q T,Th,Sa-HD  . piperacillin-tazobactam (ZOSYN)  IV  2.25 g Intravenous Q8H  . DISCONTD: amLODipine  5 mg Oral Daily  . DISCONTD: metoprolol tartrate  12.5 mg Oral Q6H      . sodium chloride 20 mL/hr at 12/21/10 0500  . dextrose 40 mL/hr at 12/21/10 0500  . feeding supplement (NEPRO CARB STEADY) 1,000 mL (12/20/10 2347)  . propofol      Objective:  Vital Signs in the last 24 hours: Blood pressure 160/73, pulse 97, temperature 98.8 F (37.1 C), temperature source Oral, resp. rate 22, height 5\' 11"  (1.803 m), weight 162 lb 4.1 oz (73.6 kg), SpO2 100.00%. Temp:  [98.5 F (36.9 C)-99.9 F (37.7 C)] 98.8 F (37.1 C) (12/02 0400) Pulse Rate:  [74-112] 97  (12/02 0351) Resp:  [12-28] 22  (12/02 0351) BP: (92-178)/(46-105) 160/73 mmHg (12/02 0754) SpO2:  [98 %-100 %] 100 % (12/02 0754) Arterial Line BP: (115-206)/(53-107) 115/60 mmHg (12/02 0000) FiO2 (%):  [29.7 %-30.5 %] 30 % (12/02 0754) Weight:  [162 lb 4.1 oz (73.6 kg)-169 lb 5 oz (76.8 kg)] 162 lb  4.1 oz (73.6 kg) (12/01 1813)  Intake/Output from previous day: 12/01 0701 - 12/02 0700 In: 1600 [I.V.:760; NG/GT:840] Out: 3952  Intake/Output from this shift:    Physical Exam: The patient remains on that. He was unresponsive.  HEENT:   the sclera are nonicteric. .  The carotids are 2+ without bruits.  There is no thyromegaly.     Lungs: He has extensive wheezes and congestion.  There is a left subclavian line in place.   dialysis cather in right subclavian.  Heart: Irregularly irregular.  Abdomen: good bowel sounds.  There is no guarding or rebound.  There is no hepatosplenomegaly or tenderness.  There are no masses.   Extremities:  He has diffuse edema.  Neuro:  The patient is unresponsive.   Lab Results:  Basename 12/21/10 0346 12/20/10 0500  WBC 7.6 6.1  HGB 8.7* 9.2*  PLT 171 61*    Basename 12/21/10 0346 12/20/10 0500  NA 128* 126*  K 4.5 3.7  CL 96 93*  CO2 24 23  GLUCOSE 105* 113*  BUN 17 29*  CREATININE 2.63* 3.94*   No results found for this basename: TROPONINI:2,CK,MB:2 in the last 72 hours No results found for this basename: BNP in the last 72 hours Hepatic Function Panel No results found for this basename: PROT,ALBUMIN,AST,ALT,ALKPHOS,BILITOT,BILIDIR,IBILI in the last 72  hours Lab Results  Component Value Date   CHOL  Value: 146        ATP III CLASSIFICATION:  <200     mg/dL   Desirable  119-147  mg/dL   Borderline High  >=829    mg/dL   High        56/21/3086   HDL 54 11/03/2009   LDLCALC  Value: 77        Total Cholesterol/HDL:CHD Risk Coronary Heart Disease Risk Table                     Men   Women  1/2 Average Risk   3.4   3.3  Average Risk       5.0   4.4  2 X Average Risk   9.6   7.1  3 X Average Risk  23.4   11.0        Use the calculated Patient Ratio above and the CHD Risk Table to determine the patient's CHD Risk.        ATP III CLASSIFICATION (LDL):  <100     mg/dL   Optimal  578-469  mg/dL   Near or Above                    Optimal   130-159  mg/dL   Borderline  629-528  mg/dL   High  >413     mg/dL   Very High 24/40/1027   TRIG 76 11/03/2009   CHOLHDL 2.7 11/03/2009   No results found for this basename: INR in the last 72 hours  Telemetry: The patient is in atrial fibrillation.  Assessment/Plan:   1.  ventricular fibrillation cardiac arrest : the patient is now in atrial fibrillation.  He is making slight improvements.  2. Hypertension:  The patient's blood pressure remains mildly elevated. Add hydralazine 10 Q 4 hr. PRN  Disposition: The patient is making slight progress - follows some commands.  Family meeting tomorrow.   Vesta Mixer, Montez Hageman., MD, [facility] 12/21/2010, 8:07 AM LOS: Day 8

## 2010-12-21 NOTE — Progress Notes (Signed)
HPI:  Jackson [name] is a 77 y.o. male admitted on 12/27/2010 with cardiac arrest after HD. Had shockable rhythm per AED applied by paramedics.  PMHx Cardiomyopathy with EF 30%, ESRD on HD T/Th/Sa, HTN, Hyperlipidemia, Anemia, Gout, TR  Antibiotics:   Zosyn 11/26>>  Vancomycin 11/26>>  Cultures/Sepsis Markers:   Sputum 11/26>>>NTD Blood 11/27>>>NTD  Access/Protocols:  11/24 R tunneled HD catheter (preadmission)  11/24 ETT>>> 11/24 OGT>>>  11/24 L [place] TLC>>>  11/25 Lt Radial Al[place]>>>  Best Practice: DVT: Hep SQ GI: Protonix  11/24 Cardiac arrest post HD, ACLS, hypothermia protocol started and complete.  Subjective: Rewarmed, no events overnight.  Physical Exam: Filed Vitals:   12/20/10 0757  BP: 181/90  Pulse: 92  Temp: 98.1 F (36.7 C)  Resp: 20     Intake/Output Summary (Last 24 hours) at 12/21/10 0934 Last data filed at 12/21/10 0500  Gross per 24 hour  Intake   1380 ml  Output   3952 ml  Net  -2572 ml   Vent Mode:  [-] CPAP FiO2 (%):  [29.7 %-30.5 %] 30 % Set Rate:  [12 bmp] 12 bmp Vt Set:  [500 mL] 500 mL PEEP:  [5 cmH20] 5 cmH20 Pressure Support:  [15 cmH20] 15 cmH20 Plateau Pressure:  [17 cmH20-29 cmH20] 18 cmH20  Neuro: Eyes closed, but patient is following commands. Cardiac: RRR, Nl S1/S2, -M/R/G. Pulmonary: Coarse BS diffusely. GI: Soft, NT, ND and +BS. Extremities: 2+ edema and -tenderness.  Labs: CBC    Component Value Date/Time   WBC 7.6 12/21/2010 0346   RBC 2.92* 12/21/2010 0346   HGB 8.7* 12/21/2010 0346   HCT 25.0* 12/21/2010 0346   PLT 171 12/21/2010 0346   MCV 85.6 12/21/2010 0346   MCH 29.8 12/21/2010 0346   MCHC 34.8 12/21/2010 0346   RDW 18.2* 12/21/2010 0346   LYMPHSABS 3.4 12/27/2010 1257   MONOABS 0.4 12-27-2010 1257   EOSABS 0.3 27-Dec-2010 1257   BASOSABS 0.1 Dec 27, 2010 1257   BMET    Component Value Date/Time   NA 128* 12/21/2010 0346   K 4.5 12/21/2010 0346   CL 96 12/21/2010 0346   CO2 24 12/21/2010 0346   GLUCOSE 105*  12/21/2010 0346   BUN 17 12/21/2010 0346   CREATININE 2.63* 12/21/2010 0346   CALCIUM 7.5* 12/21/2010 0346   GFRNONAA 21* 12/21/2010 0346   GFRAA 25* 12/21/2010 0346   ABG    Component Value Date/Time   PHART 7.505* 12/21/2010 0309   PCO2ART 33.6* 12/21/2010 0309   PO2ART 90.6 12/21/2010 0309   HCO3 26.2* 12/21/2010 0309   TCO2 27.3 12/21/2010 0309   ACIDBASEDEF 3.2* 12/20/2010 0320   O2SAT 98.4 12/21/2010 0309    Lab 12/21/10 0346  MG 1.9   Lab Results  Component Value Date   CALCIUM 7.5* 12/21/2010   PHOS 2.2* 12/21/2010   Chest Xray:   Assessment & Plan:  VDRF in setting of cardiac arrest.   - Maintain on PS as tolerated, full vent support at night then PS again in AM. - F/u CXR. - Bronchial hygiene.  Purulent bronchitis with ?PNA.  - F/U on cultures. - D7/x vancomycin and zosyn.  Shock:  Now resolved.  Actually hypertensive now. - Per family discussion no further escalation of care, CPR, cardioversion or pressors on 11/28. - Contine beta blockers PO today. - Increase nifedipine to 10 mg PO daily.  Cardiac arrest with presumed VF with hx of systolic CHF and post-dialysis hypokalemia  - Rewarmed 11/26. - Continue  beta blocker. - Inc norvac for BP control.  ESRD  - HD per renal, would benefit from more negative balance.  Anoxic encephalopathy: No sedation, following commands. - Monitor daily. - Neuro following. - Spoke with family today, they are leaning towards the no trach/peg component but would like to continue care until last possible moment prior to extubation.  I informed them that we will continue to wean and will communicate again on Tuesday after speaking with neuro with our final recommendations.  Anemia  - F/U CBC. - Transfuse for Hb < 7.   Thrombocytopenia, resolved. - F/U CBC.   Hypoglycemia  - Monitor CBG's.  - Restart TF today.   Best practices / Disposition  - SCDs for DVT Px  - Protonix for GI Px   The patient is critically ill with  multiple organ systems failure and requires high complexity decision making for assessment and support, frequent evaluation and titration of therapies, application of advanced monitoring technologies and extensive interpretation of multiple databases. Critical Care Time devoted to patient care services described in this note is 40 minutes.  Koren Bound, M.D.

## 2010-12-21 NOTE — Progress Notes (Signed)
ANTIBIOTIC CONSULT NOTE - FOLLOW UP  Pharmacy Consult for Vancomycin/Zosyn Indication: Bronchitis/PNA  Allergies  Allergen Reactions  . Fortaz (Ceftazidime) Itching    Patient Measurements: Height: 5\' 11"  (180.3 cm) Weight: 162 lb 4.1 oz (73.6 kg) IBW/kg (Calculated) : 75.3    Vital Signs: Temp: 99.2 F (37.3 C) (12/02 0800) Temp src: Oral (12/02 0800) BP: 132/88 mmHg (12/02 1200) Pulse Rate: 103  (12/02 1203) Intake/Output from previous day: 12/01 0701 - 12/02 0700 In: 1600 [I.V.:760; NG/GT:840] Out: 3952  Intake/Output from this shift:    Labs:  Basename 12/21/10 0346 12/20/10 0500 12/19/10 0355  WBC 7.6 6.1 4.2  HGB 8.7* 9.2* 8.7*  PLT 171 61* 34*  LABCREA -- -- --  CREATININE 2.63* 3.94* 3.07*   Estimated Creatinine Clearance: 23.3 ml/min (by C-G formula based on Cr of 2.63). No results found for this basename: VANCOTROUGH:2,VANCOPEAK:2,VANCORANDOM:2,GENTTROUGH:2,GENTPEAK:2,GENTRANDOM:2,TOBRATROUGH:2,TOBRAPEAK:2,TOBRARND:2,AMIKACINPEAK:2,AMIKACINTROU:2,AMIKACIN:2, in the last 72 hours   Microbiology: Recent Results (from the past 720 hour(s))  MRSA PCR SCREENING     Status: Normal   Collection Time   07-Jan-2011  4:36 PM      Component Value Range Status Comment   MRSA by PCR NEGATIVE  NEGATIVE  Final   CULTURE, RESPIRATORY     Status: Normal   Collection Time   12/15/10 11:15 AM      Component Value Range Status Comment   Specimen Description ENDOTRACHEAL   Final    Special Requests NONE   Final    Gram Stain     Final    Value: ABUNDANT WBC PRESENT, PREDOMINANTLY PMN     NO SQUAMOUS EPITHELIAL CELLS SEEN     RARE GRAM POSITIVE COCCI IN PAIRS   Culture Non-Pathogenic Oropharyngeal-type Flora Isolated.   Final    Report Status 12/18/2010 FINAL   Final   CULTURE, BLOOD (ROUTINE X 2)     Status: Normal (Preliminary result)   Collection Time   12/16/10  1:00 PM      Component Value Range Status Comment   Specimen Description BLOOD HAND LEFT   Final    Special Requests BOTTLES DRAWN AEROBIC ONLY 3.0 CC    Final    Setup Time [id number]   Final    Culture     Final    Value:        BLOOD CULTURE RECEIVED NO GROWTH TO DATE CULTURE WILL BE HELD FOR 5 DAYS BEFORE ISSUING A FINAL NEGATIVE REPORT   Report Status PENDING   Incomplete   CULTURE, BLOOD (ROUTINE X 2)     Status: Normal (Preliminary result)   Collection Time   12/16/10  1:10 PM      Component Value Range Status Comment   Specimen Description BLOOD HAND LEFT   Final    Special Requests BOTTLES DRAWN AEROBIC ONLY 2.0CC   Final    Setup Time [id number]   Final    Culture     Final    Value:        BLOOD CULTURE RECEIVED NO GROWTH TO DATE CULTURE WILL BE HELD FOR 5 DAYS BEFORE ISSUING A FINAL NEGATIVE REPORT   Report Status PENDING   Incomplete     Anti-infectives     Start     Dose/Rate Route Frequency Ordered Stop   12/18/10 1800   vancomycin (VANCOCIN) 750 mg in sodium chloride 0.9 % 150 mL IVPB        750 mg 150 mL/hr over 60 Minutes Intravenous  Once 12/18/10 1043 12/18/10 1900  12/16/10 1800   vancomycin (VANCOCIN) 750 mg in sodium chloride 0.9 % 150 mL IVPB        750 mg 150 mL/hr over 60 Minutes Intravenous  Once 12/16/10 1623 12/16/10 2221   12/15/10 1500   vancomycin (VANCOCIN) 1,500 mg in sodium chloride 0.9 % 500 mL IVPB        1,500 mg 250 mL/hr over 120 Minutes Intravenous  Once 12/15/10 1343 12/15/10 1745   12/15/10 1500  piperacillin-tazobactam (ZOSYN) IVPB 2.25 g       2.25 g 100 mL/hr over 30 Minutes Intravenous 3 times per day 12/15/10 1343            Assessment: 77 y.o. male admitted on 11/23/2010 with cardiac arrest after HD. Had shockable rhythm per AED applied by paramedics. PMHx Cardiomyopathy with EF 30%, ESRD on HD T/Th/Sa, HTN, Hyperlipidemia, Anemia, Gout.  He has remained ventilated with purulent bronchitis and possible pneumonia.  He was placed on IV Vancomycin and Zosyn for broad spectrum coverage.  Cultures have been negative thus far  and he has tolerated the antibiotics without noted complications.   Goal of Therapy:  Vancomycin trough level 15-20 mcg/ml  Plan:  Continue current antibiotic regimen. Measure antibiotic drug levels at steady state Monitor for response to therapy as well as culture data  Nadara Mustard, PharmD., MS 12/21/2010,2:05 PM

## 2010-12-21 NOTE — Consult Note (Signed)
Subjective:  Responding to simple commands.  Almost 4000 off with HD yesterday.    Objective:    Vital signs in last 24 hours: Filed Vitals:   12/21/10 0400 12/21/10 0754 12/21/10 0800 12/21/10 0900  BP:  160/73 168/102 150/88  Pulse:   92 92  Temp: 98.8 F (37.1 C)     TempSrc: Oral     Resp:   20 21  Height:      Weight:      SpO2:  100% 100% 100%   Weight change: -3.5 kg (-7 lb 11.5 oz)  Intake/Output Summary (Last 24 hours) at 12/21/10 1104 Last data filed at 12/21/10 0500  Gross per 24 hour  Intake   1190 ml  Output   3952 ml  Net  -2762 ml   Labs: Basic Metabolic Panel:  Lab 12/21/10 4098 12/20/10 0500 12/19/10 0355 12/18/10 2015 12/18/10 0341 12/17/10 0406 12/16/10 0700 12/16/10 0320  NA 128* 126* 128* -- 121* 130* 127* 131*  K 4.5 3.7 4.3 -- 3.3* 3.2* 4.2 4.2  CL 96 93* 95* -- 88* 97 96 100  CO2 24 23 24  -- 23 25 21 22   GLUCOSE 105* 113* 113* 94 131* 133* 149* --  BUN 17 29* 17 -- 27* 19 35* 31*  CREATININE 2.63* 3.94* 3.07* -- 4.02* 3.08* 5.21* 4.93*  ALB -- -- -- -- -- -- -- --  CALCIUM 7.5* 7.8* 7.6* -- 7.5* 7.5* 7.7* 8.1*  PHOS 2.2* 3.0 2.7 -- 3.1 1.5* -- --   Liver Function Tests:  Lab 12/17/10 0406 12/16/10 0700 12/15/10 0400 12/14/10 1600  AST -- 27 52* 56*  ALT -- 22 36 31  ALKPHOS -- 137* 131* 113  BILITOT -- 0.5 0.5 0.6  PROT -- 5.1* 5.2* 4.1*  ALBUMIN 1.6* 1.8* 1.8* --   No results found for this basename: LIPASE:3,AMYLASE:3 in the last 168 hours No results found for this basename: AMMONIA:3 in the last 168 hours CBC:  Lab 12/21/10 0346 12/20/10 0500 12/19/10 0355 12/18/10 0341  WBC 7.6 6.1 4.2 5.8  NEUTROABS -- -- -- --  HGB 8.7* 9.2* 8.7* 9.3*  HCT 25.0* 26.4* 25.1* 26.8*  MCV 85.6 85.4 86.6 86.5  PLT 171 61* 34* 34*   Cardiac Enzymes:  Lab 12/15/10 0654  CKTOTAL 29  CKMB 4.5*  CKMBINDEX --  TROPONINI 0.35*   CBG:  Lab 12/20/10 2354 12/20/10 2015 12/20/10 1558 12/20/10 0756 12/20/10 0419  GLUCAP 109* 86 102* 86 100*     Iron Studies: No results found for this basename: IRON:30,TIBC:30,SATURATION RATIOS:30,TRANSFERRIN:30,FERRITIN:30 in the last 168 hours Studies/Results: Dg Chest Port 1 View  12/21/2010  *RADIOLOGY REPORT*  Clinical Data: Endotracheal tube placement.  PORTABLE CHEST - 1 VIEW  Comparison: 12/20/2010  Findings: Endotracheal tube is 4.5 cm above the carina.  Improved aeration and probably decreased edema in the lower lungs.  Dialysis catheter tip near the cavoatrial junction.  Left subclavian central line in the upper SVC region.  Heart size is stable.  IMPRESSION: Improved aeration at the lung bases suggests decreased pulmonary edema.  Original Report Authenticated By: Richarda Overlie, M.D.   Dg Chest Port 1 View  12/20/2010  *RADIOLOGY REPORT*  Clinical Data: Perforation, ventilatory support, renal failure  PORTABLE CHEST - 1 VIEW  Comparison: 12/19/2010  Findings: Endotracheal tube 2.8 cm above the carina.  Bilateral central lines and NG tube stable position.  Monitor leads overlie the chest.  Heart remains enlarged with worsening diffuse pulmonary edema pattern noted.  Pleural effusions present bilaterally with basilar atelectasis / consolidation, worse in the left lower lobe. No pneumothorax.  IMPRESSION: Worsening CHF pattern.  Original Report Authenticated By: Judie Petit. Ruel Favors, M.D.   Medications:    . sodium chloride 20 mL/hr at 12/21/10 0500  . dextrose 40 mL/hr at 12/21/10 0500  . feeding supplement (NEPRO CARB STEADY) 1,000 mL (12/20/10 2347)  . propofol        . amLODipine  10 mg Oral Daily  . antiseptic oral rinse  1 application Mouth Rinse QID  . chlorhexidine  15 mL Mouth/Throat BID  . darbepoetin (ARANESP) injection - DIALYSIS  60 mcg Intravenous Q Thu-HD  . feeding supplement  30 mL Per Tube BID  . levalbuterol  0.63 mg Nebulization QID  . metoprolol tartrate  25 mg Oral Q6H  . pantoprazole sodium  40 mg Per Tube Q24H  . paricalcitol  2 mcg Intravenous Q T,Th,Sa-HD  .  piperacillin-tazobactam (ZOSYN)  IV  2.25 g Intravenous Q8H  . DISCONTD: amLODipine  5 mg Oral Daily  . DISCONTD: amLODipine  5 mg Oral Daily    I  have reviewed scheduled and prn medications.  Physical Exam: General: on vent, following simple commands Heart: reg no rub Lungs: clear bilat Abdomen: soft, NT Extremities: bilat thigh/scrotal  edema Dialysis Access: R IJ tunnelled HD cath Neuro: moving all extremities, sticks out his tongue to command  Problem/Plan: 1. ESRD- usual HD is TTS.  Will dialyze tomorrow with + pulm edema, which is improving.    2. S/P cardiac arrest with hypothermia protocol.  Neuro status improving daily. 3. Anemia- ESA, Hb around 9 4. Secondary hyperparathyroidism- low PO4, on vit D 5. HTN/volume- CCB and BB per tube 6. Nutrition- tube feeds  Jaqualin Serpa D  12/21/2010,11:04 AM  LOS: 8 days  Cell #  [phone]

## 2010-12-21 NOTE — Progress Notes (Signed)
Subjective: Remains intubated.  Receives dialysis per recommendation of nephrology.  Continues off sedation.  Objective: Vital signs in last 24 hours: Temp:  [98.5 F (36.9 C)-99.9 F (37.7 C)] 99.2 F (37.3 C) (12/02 0800) Pulse Rate:  [74-112] 94  (12/02 1100) Resp:  [12-28] 22  (12/02 1100) BP: (92-178)/(46-105) 139/75 mmHg (12/02 1100) SpO2:  [98 %-100 %] 100 % (12/02 1100) Arterial Line BP: (115-206)/(53-107) 143/68 mmHg (12/02 1100) FiO2 (%):  [29.4 %-30.5 %] 30.2 % (12/02 1100) Weight:  [73.6 kg (162 lb 4.1 oz)-76.8 kg (169 lb 5 oz)] 162 lb 4.1 oz (73.6 kg) (12/01 1813)  Intake/Output from previous day: 12/01 0701 - 12/02 0700 In: 1600 [I.V.:760; NG/GT:840] Out: 3952  Intake/Output this shift:   Nutritional status:    Neurologic Exam: Mental Status:  Patient opens eyes to verbal stimuli. Does not require deep sternal rub. Opens eyes to command and appears to visually fix on speaker. Continues to follows simple commands such as sticking out his tongue, grasping/ungrasping right hand and moving each lower extremity to command.  Cranial Nerves:  II: patient does not respond to confrontation bilaterally, pupils right 3 mm, left 3 mm,and reactive bilaterally  III,IV,VI: doll's response present bilaterally.  V,VII: corneal reflex absent on the left  VIII: grossly intact  IX,X: gag reflex unable to test. Patient intubated.  XI: trapezius strength unable to test bilaterally  XII: midline tongue extension  Motor:  Lifts both arms against gravity (right greater than left) Moves both lower extremities in the bed but does not lift them off the bed (right greater than left).  Sensory:  Responds to noxious stimuli in all extremities. Pulls both arms up to his chest. Withdraws legs to painful stimuli.  Deep Tendon Reflexes:  Absent throughout.  Plantars:  upgoing bilaterally  Cerebellar:  Unable to perform   Lab Results:  Basename 12/21/10 0346 12/20/10 0500  WBC 7.6 6.1    HGB 8.7* 9.2*  HCT 25.0* 26.4*  PLT 171 61*  NA 128* 126*  K 4.5 3.7  CL 96 93*  CO2 24 23  GLUCOSE 105* 113*  BUN 17 29*  CREATININE 2.63* 3.94*  CALCIUM 7.5* 7.8*  LABA1C -- --    Studies/Results: Dg Chest Port 1 View  12/21/2010  *RADIOLOGY REPORT*  Clinical Data: Endotracheal tube placement.  PORTABLE CHEST - 1 VIEW  Comparison: 12/20/2010  Findings: Endotracheal tube is 4.5 cm above the carina.  Improved aeration and probably decreased edema in the lower lungs.  Dialysis catheter tip near the cavoatrial junction.  Left subclavian central line in the upper SVC region.  Heart size is stable.  IMPRESSION: Improved aeration at the lung bases suggests decreased pulmonary edema.  Original Report Authenticated By: Richarda Overlie, M.D.   Dg Chest Port 1 View  12/20/2010  *RADIOLOGY REPORT*  Clinical Data: Perforation, ventilatory support, renal failure  PORTABLE CHEST - 1 VIEW  Comparison: 12/19/2010  Findings: Endotracheal tube 2.8 cm above the carina.  Bilateral central lines and NG tube stable position.  Monitor leads overlie the chest.  Heart remains enlarged with worsening diffuse pulmonary edema pattern noted.  Pleural effusions present bilaterally with basilar atelectasis / consolidation, worse in the left lower lobe. No pneumothorax.  IMPRESSION: Worsening CHF pattern.  Original Report Authenticated By: Judie Petit. Ruel Favors, M.D.    Medications:  I have reviewed the patient's current medications. Scheduled:   . amLODipine  10 mg Oral Daily  . antiseptic oral rinse  1 application Mouth Rinse QID  .  chlorhexidine  15 mL Mouth/Throat BID  . darbepoetin (ARANESP) injection - DIALYSIS  60 mcg Intravenous Q Thu-HD  . feeding supplement  30 mL Per Tube BID  . levalbuterol  0.63 mg Nebulization QID  . metoprolol tartrate  25 mg Oral Q6H  . pantoprazole sodium  40 mg Per Tube Q24H  . paricalcitol  2 mcg Intravenous Q T,Th,Sa-HD  . piperacillin-tazobactam (ZOSYN)  IV  2.25 g Intravenous Q8H  .  DISCONTD: amLODipine  5 mg Oral Daily  . DISCONTD: amLODipine  5 mg Oral Daily    Assessment/Plan:  Patient Active Hospital Problem List: Cardiopulmonary arrest (12/15/2010)   Assessment: Patient continues to show signs of improvement daily   Plan: Continue to follow with you Left Sided weakness   Assessment: Patient very likely has had a stroke.  Unclear about baseline.  Imaging from 11/24 shows evidence of an old right thalamic infarct but no evidence of hemorrhage.     Plan: Would perform MR of the brain once extubated to rule out possibility of an acute infarct.  Would not prioritize this for now.    LOS: 8 days   Thana Farr, MD [facility] [phone] 12/21/2010  11:15 AM

## 2010-12-22 ENCOUNTER — Inpatient Hospital Stay (HOSPITAL_COMMUNITY): Payer: Medicare Other

## 2010-12-22 DIAGNOSIS — I4901 Ventricular fibrillation: Secondary | ICD-10-CM

## 2010-12-22 LAB — CBC
HCT: 26 % — ABNORMAL LOW (ref 39.0–52.0)
Hemoglobin: 8.8 g/dL — ABNORMAL LOW (ref 13.0–17.0)
WBC: 10 10*3/uL (ref 4.0–10.5)

## 2010-12-22 LAB — CULTURE, BLOOD (ROUTINE X 2)
Culture: NO GROWTH
Culture: NO GROWTH

## 2010-12-22 LAB — GLUCOSE, CAPILLARY
Glucose-Capillary: 56 mg/dL — ABNORMAL LOW (ref 70–99)
Glucose-Capillary: 118 mg/dL — ABNORMAL HIGH (ref 70–99)

## 2010-12-22 LAB — BASIC METABOLIC PANEL
BUN: 29 mg/dL — ABNORMAL HIGH (ref 6–23)
Chloride: 96 mEq/L (ref 96–112)
Glucose, Bld: 118 mg/dL — ABNORMAL HIGH (ref 70–99)
Potassium: 3.1 mEq/L — ABNORMAL LOW (ref 3.5–5.1)

## 2010-12-22 LAB — BLOOD GAS, ARTERIAL
PEEP: 5 cmH2O
RATE: 12 resp/min
pCO2 arterial: 37.1 mmHg (ref 35.0–45.0)
pO2, Arterial: 127 mmHg — ABNORMAL HIGH (ref 80.0–100.0)

## 2010-12-22 LAB — MAGNESIUM: Magnesium: 2 mg/dL (ref 1.5–2.5)

## 2010-12-22 MED ORDER — HEPARIN SODIUM (PORCINE) 1000 UNIT/ML DIALYSIS
20.0000 [IU]/kg | INTRAMUSCULAR | Status: DC | PRN
  Administered 2010-12-23: 1600 [IU] via INTRAVENOUS_CENTRAL

## 2010-12-22 MED ORDER — PARICALCITOL 5 MCG/ML IV SOLN
2.0000 ug | Freq: Once | INTRAVENOUS | Status: AC
  Administered 2010-12-22: 2 ug via INTRAVENOUS

## 2010-12-22 MED ORDER — VANCOMYCIN HCL 1000 MG IV SOLR
750.0000 mg | INTRAVENOUS | Status: DC
  Administered 2010-12-23: 750 mg via INTRAVENOUS
  Filled 2010-12-22 (×2): qty 750

## 2010-12-22 MED ORDER — HEPARIN SODIUM (PORCINE) 1000 UNIT/ML IJ SOLN
1000.0000 [IU] | Freq: Once | INTRAMUSCULAR | Status: AC
  Administered 2010-12-22: 1000 [IU]

## 2010-12-22 MED ORDER — VANCOMYCIN HCL 1000 MG IV SOLR
750.0000 mg | Freq: Once | INTRAVENOUS | Status: AC
  Administered 2010-12-22: 750 mg via INTRAVENOUS
  Filled 2010-12-22: qty 750

## 2010-12-22 MED ORDER — PARICALCITOL 5 MCG/ML IV SOLN
INTRAVENOUS | Status: AC
  Filled 2010-12-22: qty 1

## 2010-12-22 MED ORDER — AMLODIPINE 1 MG/ML ORAL SUSPENSION: 5.0000 mg | Freq: Every day | ORAL | Status: DC

## 2010-12-22 MED ORDER — AMLODIPINE BESYLATE 5 MG PO TABS
5.0000 mg | ORAL_TABLET | Freq: Every day | ORAL | Status: DC
  Administered 2010-12-22: 5 mg
  Filled 2010-12-22 (×2): qty 1

## 2010-12-22 NOTE — Progress Notes (Signed)
HPI:  Jackson [name] is a 77 y.o. male admitted on 2011/01/06 with cardiac arrest after HD. Had shockable rhythm per AED applied by paramedics.  PMHx Cardiomyopathy with EF 30%, ESRD on HD T/Th/Sa, HTN, Hyperlipidemia, Anemia, Gout, TR  Antibiotics:   Zosyn 11/26>>  Vancomycin 11/26>>  Cultures/Sepsis Markers:   Sputum 11/26>>>NTD Blood 11/27>>>NTD  Access/Protocols:  11/24 R tunneled HD catheter (preadmission)  11/24 ETT>>> 11/24 OGT>>>  11/24 L [place] TLC>>>  11/25 Lt Radial Al[place]>>>  Best Practice: DVT: Hep SQ GI: Protonix  11/24 Cardiac arrest post HD, ACLS, hypothermia protocol started and complete.  Subjective: Rewarmed, no events overnight.  Physical Exam: Filed Vitals:   12/20/10 0757  BP: 181/90  Pulse: 92  Temp: 98.1 F (36.7 C)  Resp: 20     Intake/Output Summary (Last 24 hours) at 12/22/10 1052 Last data filed at 12/22/10 1000  Gross per 24 hour  Intake   1085 ml  Output   3154 ml  Net  -2069 ml   Vent Mode:  [-] PRVC FiO2 (%):  [29.2 %-30.6 %] 30.1 % Set Rate:  [12 bmp] 12 bmp Vt Set:  [500 mL] 500 mL PEEP:  [4.8 cmH20-5.4 cmH20] 4.8 cmH20 Pressure Support:  [10 cmH20-15 cmH20] 10 cmH20 Plateau Pressure:  [17 cmH20-25 cmH20] 22 cmH20  Neuro: Eyes closed, but patient is following commands. Cardiac: RRR, Nl S1/S2, -M/R/G. Pulmonary: Coarse BS diffusely. GI: Soft, NT, ND and +BS. Extremities: 2+ edema and -tenderness.  Labs: CBC    Component Value Date/Time   WBC 10.0 12/22/2010 0300   RBC 2.94* 12/22/2010 0300   HGB 8.8* 12/22/2010 0300   HCT 26.0* 12/22/2010 0300   PLT 103* 12/22/2010 0300   MCV 88.4 12/22/2010 0300   MCH 29.9 12/22/2010 0300   MCHC 33.8 12/22/2010 0300   RDW 18.5* 12/22/2010 0300   LYMPHSABS 3.4 January 06, 2011 1257   MONOABS 0.4 01/06/11 1257   EOSABS 0.3 2011/01/06 1257   BASOSABS 0.1 January 06, 2011 1257   BMET    Component Value Date/Time   NA 130* 12/22/2010 0300   K 3.1* 12/22/2010 0300   CL 96 12/22/2010 0300   CO2 23  12/22/2010 0300   GLUCOSE 118* 12/22/2010 0300   BUN 29* 12/22/2010 0300   CREATININE 3.88* 12/22/2010 0300   CALCIUM 8.1* 12/22/2010 0300   GFRNONAA 13* 12/22/2010 0300   GFRAA 16* 12/22/2010 0300   ABG    Component Value Date/Time   PHART 7.453* 12/22/2010 0500   PCO2ART 37.1 12/22/2010 0500   PO2ART 127.0* 12/22/2010 0500   HCO3 25.6* 12/22/2010 0500   TCO2 26.7 12/22/2010 0500   ACIDBASEDEF 3.2* 12/20/2010 0320   O2SAT 99.5 12/22/2010 0500    Lab 12/22/10 0300  MG 2.0   Lab Results  Component Value Date   CALCIUM 8.1* 12/22/2010   PHOS 2.4 12/22/2010   Chest Xray:   Assessment & Plan:  VDRF in setting of cardiac arrest.   - Maintain on PS as tolerated, full vent support at night then PS again in AM. - F/u CXR. - Bronchial hygiene.  Purulent bronchitis with ?PNA.  - F/U on cultures. - D7/x vancomycin and zosyn.  Shock:  Now resolved.  Actually hypertensive now. - Per family discussion no further escalation of care, CPR, cardioversion or pressors on 11/28. - Contine beta blockers PO today. - Increase nifedipine to 10 mg PO daily.  Cardiac arrest with presumed VF with hx of systolic CHF and post-dialysis hypokalemia  - Rewarmed 11/26. -  Continue beta blocker. - Inc norvac for BP control.  ESRD  - HD per renal, would benefit from more negative balance.  Anoxic encephalopathy: No sedation, following commands. - Monitor daily. - Neuro following. - Spoke with family, they are leaning towards the no trach/peg component but would like to continue care until last possible moment prior to extubation.  I informed them that we will continue to wean and will communicate again on Tuesday after speaking with neuro with our final recommendations.  Anemia  - F/U CBC. - Transfuse for Hb < 7.   Thrombocytopenia, resolved. - F/U CBC.   Hypoglycemia  - Monitor CBG's.  - Restart TF today.   Best practices / Disposition  - SCDs for DVT Px  - Protonix for GI Px   The patient is  critically ill with multiple organ systems failure and requires high complexity decision making for assessment and support, frequent evaluation and titration of therapies, application of advanced monitoring technologies and extensive interpretation of multiple databases. Critical Care Time devoted to patient care services described in this note is 40 minutes.  Koren Bound, M.D.

## 2010-12-22 NOTE — Progress Notes (Signed)
Subjective:  Neuro status about the same as it was on Friday it seems to me.  Got HD on Saturday, now on again today (off schedule)   Objective Vital signs in last 24 hours: Filed Vitals:   12/22/10 0815 12/22/10 0830 12/22/10 0845 12/22/10 0900  BP: 121/83 110/74 130/84 150/86  Pulse: 101 101 30 69  Temp:      TempSrc:      Resp: 19 19 22 18   Height:      Weight:      SpO2: 100% 99% 83% 94%   Weight change: 2.5 kg (5 lb 8.2 oz)  Intake/Output Summary (Last 24 hours) at 12/22/10 0911 Last data filed at 12/22/10 0400  Gross per 24 hour  Intake   1085 ml  Output      0 ml  Net   1085 ml   Labs: Basic Metabolic Panel:  Lab 12/22/10 3343 12/21/10 0346 12/20/10 0500  NA 130* 128* 126*  K 3.1* 4.5 3.7  CL 96 96 93*  CO2 23 24 23   GLUCOSE 118* 105* 113*  BUN 29* 17 29*  CREATININE 3.88* 2.63* 3.94*  CALCIUM 8.1* 7.5* 7.8*  ALB -- -- --  PHOS 2.4 2.2* 3.0   Liver Function Tests:  Lab 12/17/10 0406 12/16/10 0700  AST -- 27  ALT -- 22  ALKPHOS -- 137*  BILITOT -- 0.5  PROT -- 5.1*  ALBUMIN 1.6* 1.8*   No results found for this basename: LIPASE:3,AMYLASE:3 in the last 168 hours No results found for this basename: AMMONIA:3 in the last 168 hours CBC:  Lab 12/22/10 0300 12/21/10 0346 12/20/10 0500 12/19/10 0355 12/18/10 0341  WBC 10.0 7.6 6.1 -- --  NEUTROABS -- -- -- -- --  HGB 8.8* 8.7* 9.2* -- --  HCT 26.0* 25.0* 26.4* -- --  MCV 88.4 85.6 85.4 86.6 86.5  PLT 103* 171 61* -- --   Cardiac Enzymes: No results found for this basename: CKTOTAL:5,CKMB:5,CKMBINDEX:5,TROPONINI:5 in the last 168 hours CBG:  Lab 12/22/10 0805 12/22/10 0426 12/21/10 2347 12/21/10 2028 12/21/10 1749  GLUCAP 101* 118* 104* 125* 104*    Iron Studies: No results found for this basename: IRON,TIBC,TRANSFERRIN,FERRITIN in the last 72 hours Studies/Results: Dg Chest Port 1 View  12/22/2010  *RADIOLOGY REPORT*  Clinical Data: Endotracheal tube placement  PORTABLE CHEST - 1 VIEW   Comparison: 12/21/2010  Findings: Cardiomediastinal silhouette is stable.  Slight worsening congestion/pulmonary edema.  Stable endotracheal tube position with tip 5.2 cm above the carina.  Right dialysis catheter is unchanged in position.  Stable left subclavian central line position. Probable bilateral small pleural effusion and bilateral basilar atelectasis or infiltrate.  IMPRESSION:  Slight worsening congestion/pulmonary edema.  Stable endotracheal tube position with tip 5.2 cm above the carina.  Right dialysis catheter is unchanged in position.  Stable left subclavian central line position.  Probable bilateral small pleural effusion and bilateral basilar atelectasis or infiltrate.  Original Report Authenticated By: Natasha Mead, M.D.   Dg Chest Port 1 View  12/21/2010  *RADIOLOGY REPORT*  Clinical Data: Endotracheal tube placement.  PORTABLE CHEST - 1 VIEW  Comparison: 12/20/2010  Findings: Endotracheal tube is 4.5 cm above the carina.  Improved aeration and probably decreased edema in the lower lungs.  Dialysis catheter tip near the cavoatrial junction.  Left subclavian central line in the upper SVC region.  Heart size is stable.  IMPRESSION: Improved aeration at the lung bases suggests decreased pulmonary edema.  Original Report Authenticated By: Richarda Overlie,  M.D.   Medications: Infusions:    . sodium chloride 20 mL/hr at 12/22/10 0400  . dextrose 40 mL/hr at 12/22/10 0400  . feeding supplement (NEPRO CARB STEADY) 1,000 mL (12/22/10 0434)  . propofol      Scheduled Medications:    . amLODipine  10 mg Oral Daily  . antiseptic oral rinse  1 application Mouth Rinse QID  . chlorhexidine  15 mL Mouth/Throat BID  . darbepoetin (ARANESP) injection - DIALYSIS  60 mcg Intravenous Q Thu-HD  . feeding supplement  30 mL Per Tube BID  . levalbuterol  0.63 mg Nebulization QID  . metoprolol tartrate  25 mg Oral Q6H  . pantoprazole sodium  40 mg Per Tube Q24H  . paricalcitol  2 mcg Intravenous Q  T,Th,Sa-HD  . piperacillin-tazobactam (ZOSYN)  IV  2.25 g Intravenous Q8H  . DISCONTD: amLODipine  5 mg Oral Daily    have reviewed scheduled and prn medications.  Physical Exam: General: intubated, will open eyes and at times can follow commands.  Looks agitated in the bed today Heart: RRR Lungs: mostly clr Abdomen: soft, NT Extremities: pitting edema Dialysis Access: PC on right   I Assessment/ Plan: Pt is a 77 y.o. yo male ESRD who was admitted on 12/17/2010 with  S/p code on HD  Assessment/Plan: 1. S/p cardiac arrest-  Cardiomyopathy at baseline.  Is holding his own.  Per the family no escalation of care 2. ESRD- usual days TTS via PC.  Got done Saturday , Monday, will do again tomorrow to get back on schedule  3. Anemia- hgb 8.8, on aranesp.  No recent iron stores, will check with HD tomorrow 4. Secondary hyperparathyroidism- zemplar only due to intubated status 5. HTN/volume- very overloaded, will try again to decrease his D10 to [facility] so can keep up.  Hypertensive likely from volume, will attempt to decrease BP meds so can be more successful with volume removal 6. Neuro- making small strides daily per neurology 7. Hypokalemia- running on high K bath, trying to keep above 4 for cardiac reasons.   Mattelyn Imhoff A   12/22/2010,9:11 AM  LOS: 9 days

## 2010-12-22 NOTE — Progress Notes (Signed)
UR Completed.  Drisana Schweickert Jane [phone] 12/22/2010  

## 2010-12-22 NOTE — Progress Notes (Signed)
Subjective:  Intubated. Awakens to command.   Objective:  Vital Signs in the last 24 hours: Temp:  [98.2 F (36.8 C)-99.2 F (37.3 C)] 99.2 F (37.3 C) (12/03 0000) Pulse Rate:  [30-103] 97  (12/03 0745) Resp:  [14-27] 24  (12/03 0745) BP: (105-181)/(55-102) 105/63 mmHg (12/03 0745) SpO2:  [5 %-100 %] 98 % (12/03 0745) Arterial Line BP: (118-182)/(59-95) 161/84 mmHg (12/03 0600) FiO2 (%):  [29.2 %-30.6 %] 30 % (12/03 0745) Weight:  [79.3 kg (174 lb 13.2 oz)] 174 lb 13.2 oz (79.3 kg) (12/03 0700)  Intake/Output from previous day: 12/02 0701 - 12/03 0700 In: 1180 [I.V.:590; NG/GT:540; IV Piggyback:50] Out: -   Physical Exam: Intubated, NAD HEENT: nearly edentulous Neck: JVP - normal Lungs: coarse bilaterally CV: RRR without murmur or gallop, distant Abd: soft, NT, Positive BS, no hepatomegaly Ext: no C/C/E, SCD's in place Skin: warm/dry no rash Neuro: squeezes hands to command, does not follow other commands for me.  Lab Results:  Basename 12/22/10 0300 12/21/10 0346  WBC 10.0 7.6  HGB 8.8* 8.7*  PLT 103* 171    Basename 12/22/10 0300 12/21/10 0346  NA 130* 128*  K 3.1* 4.5  CL 96 96  CO2 23 24  GLUCOSE 118* 105*  BUN 29* 17  CREATININE 3.88* 2.63*   No results found for this basename: TROPONINI:2,CK,MB:2 in the last 72 hours  Tele: Atrial fibrillation heart rate 90's, short run of NSVT last night  Assessment/Plan:  1. VF arrest - defibrillated at dialysis 2. Hypoxic encephalopathy, slowly improving 3. Acute on chronic systolic HF, volume management per hemodialysis.  4. HTN - BP improved on combination of metoprolol and amlodipine.  5. Dispo - continue support. No escalation of care per family. Slow recovery.   Tonny Bollman, M.D. 12/22/2010, 7:49 AM

## 2010-12-23 ENCOUNTER — Inpatient Hospital Stay (HOSPITAL_COMMUNITY): Payer: Medicare Other

## 2010-12-23 DIAGNOSIS — N186 End stage renal disease: Secondary | ICD-10-CM

## 2010-12-23 DIAGNOSIS — J9600 Acute respiratory failure, unspecified whether with hypoxia or hypercapnia: Secondary | ICD-10-CM

## 2010-12-23 DIAGNOSIS — I4901 Ventricular fibrillation: Secondary | ICD-10-CM

## 2010-12-23 DIAGNOSIS — G931 Anoxic brain damage, not elsewhere classified: Secondary | ICD-10-CM

## 2010-12-23 LAB — BLOOD GAS, ARTERIAL
Drawn by: 31101
MECHVT: 500 mL
RATE: 12 resp/min
TCO2: 27.7 mmol/L (ref 0–100)
pCO2 arterial: 38.5 mmHg (ref 35.0–45.0)
pH, Arterial: 7.453 — ABNORMAL HIGH (ref 7.350–7.450)

## 2010-12-23 LAB — PHOSPHORUS: Phosphorus: 2 mg/dL — ABNORMAL LOW (ref 2.3–4.6)

## 2010-12-23 LAB — GLUCOSE, CAPILLARY
Glucose-Capillary: 63 mg/dL — ABNORMAL LOW (ref 70–99)
Glucose-Capillary: 100 mg/dL — ABNORMAL HIGH (ref 70–99)

## 2010-12-23 LAB — IRON AND TIBC
Iron: 17 ug/dL — ABNORMAL LOW (ref 42–135)
TIBC: 91 ug/dL — ABNORMAL LOW (ref 215–435)

## 2010-12-23 LAB — CBC
HCT: 23.2 % — ABNORMAL LOW (ref 39.0–52.0)
MCH: 30 pg (ref 26.0–34.0)
MCHC: 34.5 g/dL (ref 30.0–36.0)
MCV: 86.9 fL (ref 78.0–100.0)
RDW: 18.3 % — ABNORMAL HIGH (ref 11.5–15.5)
WBC: 10.2 10*3/uL (ref 4.0–10.5)

## 2010-12-23 LAB — BASIC METABOLIC PANEL
BUN: 28 mg/dL — ABNORMAL HIGH (ref 6–23)
Chloride: 97 mEq/L (ref 96–112)
Creatinine, Ser: 3.55 mg/dL — ABNORMAL HIGH (ref 0.50–1.35)
GFR calc Af Amer: 17 mL/min — ABNORMAL LOW (ref >90)

## 2010-12-23 MED ORDER — DEXTROSE 50 % IV SOLN
INTRAVENOUS | Status: AC
  Administered 2010-12-23: 25 mL via INTRAVENOUS
  Filled 2010-12-23: qty 50

## 2010-12-23 MED ORDER — AMLODIPINE BESYLATE 10 MG PO TABS
10.0000 mg | ORAL_TABLET | Freq: Every day | ORAL | Status: DC
  Administered 2010-12-23 – 2011-01-01 (×10): 10 mg
  Filled 2010-12-23 (×11): qty 1

## 2010-12-23 MED ORDER — DEXTROSE 10 % IV SOLN
INTRAVENOUS | Status: DC
  Administered 2010-12-23: 17:00:00 via INTRAVENOUS
  Administered 2010-12-24: 20 mL/h via INTRAVENOUS
  Administered 2010-12-27: 40 mL/h via INTRAVENOUS
  Administered 2010-12-28 – 2011-01-04 (×8): via INTRAVENOUS
  Filled 2010-12-23 (×3): qty 1000

## 2010-12-23 MED ORDER — FENTANYL CITRATE 0.05 MG/ML IJ SOLN
25.0000 ug | INTRAMUSCULAR | Status: DC | PRN
  Administered 2010-12-23: 50 ug via INTRAVENOUS
  Administered 2010-12-24: 25 ug via INTRAVENOUS
  Administered 2010-12-24: 50 ug via INTRAVENOUS
  Administered 2010-12-24 (×2): 25 ug via INTRAVENOUS
  Administered 2010-12-24 – 2010-12-26 (×8): 50 ug via INTRAVENOUS
  Filled 2010-12-23 (×13): qty 2

## 2010-12-23 NOTE — Progress Notes (Signed)
Patient extremely restless during assessment at beginning of shift. Patient is able to follow simple commands and track with eyes. Coughing/gagging on ventilator with copious secretions. Cleaned patients flexiseal and perineum and repositioned patient to try calm him down. Patient still very agitated, patient able to grab ett/ogt with right hand and attempt to pull, left hand more weak. RN sitting in room for safety. Safety mittens placed to keep patient from pulling lines/tubes. Still agitated and gagging on ETT tube with mittens, worried about self extubation. Patient looks very uncomfortable grimacing and moving all over bed. Elink md called and explained situation. Fentanyl PRN ordered. Patient more comfortable after dose of PRN fentanyl, not moving as frequently no grimacing. Still moving mittens towards face and knocking off vent tubing occasionally. Will continue to monitor. Normajean Baxter

## 2010-12-23 NOTE — Progress Notes (Signed)
Subjective: Patient remains intubated.  Is on a regular HD schedule and is receiving HD today.  Objective: Vital signs in last 24 hours: Temp:  [100.3 F (37.9 C)-100.4 F (38 C)] 100.4 F (38 C) (12/04 0800) Pulse Rate:  [30-111] 35  (12/04 1140) Resp:  [13-30] 23  (12/04 1140) BP: (109-148)/(17-105) 141/65 mmHg (12/04 1140) SpO2:  [67 %-100 %] 100 % (12/04 1140) FiO2 (%):  [29.8 %-40.9 %] 40.4 % (12/04 1140) Weight:  [73.4 kg (161 lb 13.1 oz)-73.5 kg (162 lb 0.6 oz)] 162 lb 0.6 oz (73.5 kg) (12/04 0730)  Intake/Output from previous day: 12/03 0701 - 12/04 0700 In: 2107.7 [I.V.:1077.7; NG/GT:930; IV Piggyback:100] Out: 3154  Intake/Output this shift: Total I/O In: 140 [I.V.:70; NG/GT:70] Out: 4118 [Other:4118] Nutritional status:    Neurologic Exam: Mental Status:  Patient opens eyes to verbal stimuli directed at others. Does not require deep sternal rub. Tracks people in the room visually.  Nods head in response to questioning.  Continues to follows simple commands such as sticking out his tongue, grasping/ungrasping right and left hand and moving each lower extremity to command.  Cranial Nerves:  II: patient does not respond to confrontation bilaterally, pupils right 3 mm, left 3 mm,and reactive bilaterally  III,IV,VI: doll's response present bilaterally.  V,VII: corneal reflex absent on the left  VIII: grossly intact  IX,X: gag reflex unable to test. Patient intubated.  XI: trapezius strength unable to test bilaterally  XII: midline tongue extension  Motor:  Lifts both arms against gravity (right greater than left) Moves both lower extremities in the bed and is able to lift each leg off of the bed fairly symmetrically.  Sensory:  Responds to noxious stimuli in all extremities. Pulls both arms up to his chest. Withdraws legs to painful stimuli.  Deep Tendon Reflexes:  Absent throughout.  Plantars:  upgoing bilaterally  Cerebellar:  Unable to perform   Lab  Results:  Basename 12/23/10 0425 12/22/10 0300  WBC 10.2 10.0  HGB 8.0* 8.8*  HCT 23.2* 26.0*  PLT 116* 103*  NA 132* 130*  K 3.2* 3.1*  CL 97 96  CO2 27 23  GLUCOSE 107* 118*  BUN 28* 29*  CREATININE 3.55* 3.88*  CALCIUM 8.0* 8.1*  LABA1C -- --    Studies/Results: Dg Chest Port 1 View  12/23/2010  *RADIOLOGY REPORT*  Clinical Data: Endotracheal tube placement  PORTABLE CHEST - 1 VIEW  Comparison: Portable chest x-ray of 12/22/2010  Findings: The tip of the endotracheal tube is approximately 3.8 cm above the carina.  Aeration has improved with improvement in pulmonary vascular congestion as well.  There is still persistent opacity at the left lung base which may represent atelectasis, effusion, or possibly pneumonia.  Mild cardiomegaly is stable. Left and right central venous lines remain.  IMPRESSION:  1.  Improved aeration and improved pulmonary vascular congestion. 2.  Tip of endotracheal tube 3.8 cm above carina. 3.  Persistent left basilar opacity.  Original Report Authenticated By: Juline Patch, M.D.   Dg Chest Port 1 View  12/22/2010  *RADIOLOGY REPORT*  Clinical Data: Endotracheal tube placement  PORTABLE CHEST - 1 VIEW  Comparison: 12/21/2010  Findings: Cardiomediastinal silhouette is stable.  Slight worsening congestion/pulmonary edema.  Stable endotracheal tube position with tip 5.2 cm above the carina.  Right dialysis catheter is unchanged in position.  Stable left subclavian central line position. Probable bilateral small pleural effusion and bilateral basilar atelectasis or infiltrate.  IMPRESSION:  Slight worsening congestion/pulmonary edema.  Stable endotracheal tube position with tip 5.2 cm above the carina.  Right dialysis catheter is unchanged in position.  Stable left subclavian central line position.  Probable bilateral small pleural effusion and bilateral basilar atelectasis or infiltrate.  Original Report Authenticated By: Natasha Mead, M.D.    Medications:  I have  reviewed the patient's current medications. Scheduled:   . amLODipine  10 mg Per Tube QHS  . antiseptic oral rinse  1 application Mouth Rinse QID  . chlorhexidine  15 mL Mouth/Throat BID  . darbepoetin (ARANESP) injection - DIALYSIS  60 mcg Intravenous Q Thu-HD  . dextrose      . feeding supplement  30 mL Per Tube BID  . levalbuterol  0.63 mg Nebulization QID  . metoprolol tartrate  25 mg Oral Q6H  . pantoprazole sodium  40 mg Per Tube Q24H  . paricalcitol  2 mcg Intravenous Q T,Th,Sa-HD  . piperacillin-tazobactam (ZOSYN)  IV  2.25 g Intravenous Q8H  . vancomycin  750 mg Intravenous Once  . vancomycin  750 mg Intravenous Q T,Th,Sa-HD  . DISCONTD: amLODipine  5 mg Per Tube Daily    Assessment/Plan:  Patient Active Hospital Problem List: Cardiopulmonary arrest (12/15/2010)   Assessment: Patient continues to show signs of improvement daily   Plan: Continue to follow with you. Unclear exactly how much more the patient will improve but shows no signs of plateauing at this time.  Left Sided weakness    Assessment: Patient very likely has had a stroke. Unclear about baseline. Imaging from 11/24 shows evidence of an old right thalamic infarct but no evidence of hemorrhage.    Plan: Would perform MR of the brain once extubated to rule out possibility of an acute infarct. Would not prioritize this for now  Case has been discussed with Dr. Molli Knock       LOS: 10 days   Thana Farr, MD [facility] [phone] 12/23/2010  11:52 AM

## 2010-12-23 NOTE — Progress Notes (Signed)
HPI:  Jackson [name] is a 77 y.o. male admitted on 12-26-2010 with cardiac arrest after HD. Had shockable rhythm per AED applied by paramedics.  PMHx Cardiomyopathy with EF 30%, ESRD on HD T/Th/Sa, HTN, Hyperlipidemia, Anemia, Gout, TR  Antibiotics:   Zosyn 11/26>>  Vancomycin 11/26>>  Cultures/Sepsis Markers:   Sputum 11/26>>>NTD Blood 11/27>>>NTD  Access/Protocols:  11/24 R tunneled HD catheter (preadmission)  11/24 ETT>>> 11/24 OGT>>>  11/24 L  TLC>>>  11/25 Lt Radial [place]>>>12/1  Best Practice: DVT: Hep SQ GI: Protonix  11/24 Cardiac arrest post HD, ACLS, hypothermia protocol started and complete.  Subjective: Rewarmed, no events overnight.  Physical Exam: Filed Vitals:   12/20/10 0757  BP: 181/90  Pulse: 92  Temp: 98.1 F (36.7 C)  Resp: 20   Intake/Output Summary (Last 24 hours) at 12/23/10 0938 Last data filed at 12/23/10 0800  Gross per 24 hour  Intake 2002.67 ml  Output   3154 ml  Net -1151.33 ml   Vent Mode:  [-] PRVC FiO2 (%):  [29.8 %-40.6 %] 40.4 % Set Rate:  [12 bmp] 12 bmp Vt Set:  [500 mL] 500 mL PEEP:  [5 cmH20] 5 cmH20 Pressure Support:  [8 cmH20] 8 cmH20 Plateau Pressure:  [17 cmH20-27 cmH20] 27 cmH20  Neuro: Eyes closed, but patient is following commands. Cardiac: RRR, Nl S1/S2, -M/R/G. Pulmonary: Coarse BS diffusely. GI: Soft, NT, ND and +BS. Extremities: 2+ edema and -tenderness.  Labs: CBC    Component Value Date/Time   WBC 10.2 12/23/2010 0425   RBC 2.67* 12/23/2010 0425   HGB 8.0* 12/23/2010 0425   HCT 23.2* 12/23/2010 0425   PLT 116* 12/23/2010 0425   MCV 86.9 12/23/2010 0425   MCH 30.0 12/23/2010 0425   MCHC 34.5 12/23/2010 0425   RDW 18.3* 12/23/2010 0425   LYMPHSABS 3.4 December 26, 2010 1257   MONOABS 0.4 12/26/2010 1257   EOSABS 0.3 12-26-2010 1257   BASOSABS 0.1 26-Dec-2010 1257   BMET    Component Value Date/Time   NA 132* 12/23/2010 0425   K 3.2* 12/23/2010 0425   CL 97 12/23/2010 0425   CO2 27 12/23/2010 0425   GLUCOSE  107* 12/23/2010 0425   BUN 28* 12/23/2010 0425   CREATININE 3.55* 12/23/2010 0425   CALCIUM 8.0* 12/23/2010 0425   GFRNONAA 15* 12/23/2010 0425   GFRAA 17* 12/23/2010 0425   ABG    Component Value Date/Time   PHART 7.453* 12/23/2010 0319   PCO2ART 38.5 12/23/2010 0319   PO2ART 49.5* 12/23/2010 0319   HCO3 26.6* 12/23/2010 0319   TCO2 27.7 12/23/2010 0319   ACIDBASEDEF 3.2* 12/20/2010 0320   O2SAT 86.7 12/23/2010 0319    Lab 12/23/10 0425  MG 2.0   Lab Results  Component Value Date   CALCIUM 8.0* 12/23/2010   PHOS 2.0* 12/23/2010   Chest X-ray: ET tube in good position, improving pulmonary edema.  Assessment & Plan:  VDRF in setting of cardiac arrest.   - Maintain on PS as tolerated, full vent support at night then PS again in AM. - F/u CXR. - Bronchial hygiene.  Purulent bronchitis with ?PNA.  - F/U on cultures. - D8/x vancomycin and zosyn.  Shock:  Now resolved.  Actually hypertensive now. - Per family discussion no further escalation of care, CPR, cardioversion or pressors on 11/28. - Continue beta blockers PO today. - Increased nifedipine to 10 mg PO daily.  Cardiac arrest with presumed VF with hx of systolic CHF and post-dialysis hypokalemia  - Rewarmed 11/26. -  Continue beta blocker. - Inc norvac for BP control.  ESRD  - HD per renal, would benefit from more negative balance.  Anoxic encephalopathy: No sedation, following commands. - Monitor daily. - Neuro following. - Spoke with family, they are leaning towards the no trach/peg component but would like to continue care until last possible moment prior to extubation.  I informed them that we will continue to wean and will communicate again on Tuesday after speaking with neuro with our final recommendations.  Anemia  - F/U CBC. - Transfuse for Hb < 7.   Thrombocytopenia, resolved. - F/U CBC.   Hypoglycemia  - Monitor CBG's.  - Restart TF today.   Best practices / Disposition  - SCDs for DVT Px  - Protonix  for GI Px   Spoke with the daughter over the phone, informed her that the patient is improving and giving him a couple more days is appropriate.  She informed me that they met as a family and decided on no trach/peg and once extubated do not re-intubate.  Will get in touch with her again on Thursday.  The patient is critically ill with multiple organ systems failure and requires high complexity decision making for assessment and support, frequent evaluation and titration of therapies, application of advanced monitoring technologies and extensive interpretation of multiple databases. Critical Care Time devoted to patient care services described in this note is 40 minutes.  Koren Bound, M.D.

## 2010-12-23 NOTE — Progress Notes (Signed)
CBG:63  Treatment: D50 IV 25 mL  Symptoms: None  Follow-up CBG: Time:1140 CBG Result:87  Possible Reasons for Event: Other: D10W stopped this morning per order  Comments/MD notified:  Dr. Kathrene Bongo notified. Orders received to restart D10W at 20cc/hr.    Charlott Holler

## 2010-12-23 NOTE — Progress Notes (Signed)
Subjective:  Intubated.  Responds to commands.   Objective:  Vital Signs in the last 24 hours: Temp:  [97.6 F (36.4 C)-100.4 F (38 C)] 100.4 F (38 C) (12/04 0000) Pulse Rate:  [26-110] 110  (12/04 0815) Resp:  [13-30] 24  (12/04 0815) BP: (109-150)/(59-94) 137/80 mmHg (12/04 0815) SpO2:  [64 %-100 %] 100 % (12/04 0815) Arterial Line BP: (151-168)/(69-79) 164/69 mmHg (12/03 1100) FiO2 (%):  [29.6 %-40.6 %] 39.4 % (12/04 0815) Weight:  [73.4 kg (161 lb 13.1 oz)-76.2 kg (167 lb 15.9 oz)] 162 lb 0.6 oz (73.5 kg) (12/04 0730)  Intake/Output from previous day: 12/03 0701 - 12/04 0700 In: 2107.7 [I.V.:1077.7; NG/GT:930; IV Piggyback:100] Out: 3154   Physical Exam: Pt awake, in NAD. Squeezes hands to command, tracks with his eyes. HEENT: normal, ET tube in place Neck: JVP - normal Lungs: coparse bilaterally CV: RRR without murmur or gallop Abd: soft, NT, Positive BS Ext: no C/C/E Skin: warm/dry no rash   Lab Results:  Basename 12/23/10 0425 12/22/10 0300  WBC 10.2 10.0  HGB 8.0* 8.8*  PLT 116* 103*    Basename 12/23/10 0425 12/22/10 0300  NA 132* 130*  K 3.2* 3.1*  CL 97 96  CO2 27 23  GLUCOSE 107* 118*  BUN 28* 29*  CREATININE 3.55* 3.88*   No results found for this basename: TROPONINI:2,CK,MB:2 in the last 72 hours  Tele: Sinus rhythm, pvc's, coarse baseline  Assessment/Plan:  1. VF arrest - defibrillated/resuscitated. Awaiting neuro recovery. 2. Hypoxic encephalopathy, slowly improving  3. Acute on chronic systolic HF, volume management per hemodialysis.  4. HTN - BP improved on combination of metoprolol and amlodipine.  5. Rhythm - I think sinus but difficult to tell. Will check 12-lead EKG. 6. Dispo - continue support. No escalation of care per family. Slow recovery.  Tonny Bollman, M.D. 12/23/2010, 8:28 AM

## 2010-12-23 NOTE — Progress Notes (Signed)
eLink Physician-Brief Progress Note Patient Name: Jackson [name] DOB: Oct 01, 1928 MRN: [id number]  Date of Service  12/23/2010   HPI/Events of Note   RN called 20:45 saying restless, writhing, gagging and almost self extubated x 2. Following command prn  eICU Interventions  Order sitter Prn fentanyl   Intervention Category Major Interventions: Delirium, psychosis, severe agitation - evaluation and management Intermediate Interventions: Hypertension - evaluation and management Minor Interventions: Routine modifications to care plan (e.g. PRN medications for pain, fever)  Kimya Mccahill 12/23/2010, 8:53 PM

## 2010-12-23 NOTE — Progress Notes (Signed)
eLink Physician-Brief Progress Note Patient Name: Jackson [name] DOB: 07-24-1928 MRN: [id number]  Date of Service  12/23/2010   HPI/Events of Note  Multiple loose stools   eICU Interventions  Order for flexiseal   Intervention Category Major Interventions: Electrolyte abnormality - evaluation and management Intermediate Interventions: Hypertension - evaluation and management Minor Interventions: Routine modifications to care plan (e.g. PRN medications for pain, fever)  DETERDING,ELIZABETH 12/23/2010, 4:20 AM

## 2010-12-23 NOTE — Progress Notes (Signed)
Subjective:  Alert but no commands.  Back on D10 at 40 cc/hour.  Currently on HD, tolerating well Objective Vital signs in last 24 hours: Filed Vitals:   12/23/10 0745 12/23/10 0748 12/23/10 0752 12/23/10 0800  BP: 141/59  131/74 125/67  Pulse: 96  97 102  Temp:      TempSrc:      Resp: 21  18 17   Height:      Weight:      SpO2: 100% 100% 100%    Weight change: -3.1 kg (-6 lb 13.4 oz)  Intake/Output Summary (Last 24 hours) at 12/23/10 0817 Last data filed at 12/23/10 0800  Gross per 24 hour  Intake 2097.67 ml  Output   3154 ml  Net -1056.33 ml   Labs: Basic Metabolic Panel:  Lab 12/23/10 1191 12/22/10 0300 12/21/10 0346  NA 132* 130* 128*  K 3.2* 3.1* 4.5  CL 97 96 96  CO2 27 23 24   GLUCOSE 107* 118* 105*  BUN 28* 29* 17  CREATININE 3.55* 3.88* 2.63*  CALCIUM 8.0* 8.1* 7.5*  ALB -- -- --  PHOS 2.0* 2.4 2.2*   Liver Function Tests:  Lab 12/17/10 0406  AST --  ALT --  ALKPHOS --  BILITOT --  PROT --  ALBUMIN 1.6*   No results found for this basename: LIPASE:3,AMYLASE:3 in the last 168 hours No results found for this basename: AMMONIA:3 in the last 168 hours CBC:  Lab 12/23/10 0425 12/22/10 0300 12/21/10 0346 12/20/10 0500 12/19/10 0355  WBC 10.2 10.0 7.6 -- --  NEUTROABS -- -- -- -- --  HGB 8.0* 8.8* 8.7* -- --  HCT 23.2* 26.0* 25.0* -- --  MCV 86.9 88.4 85.6 85.4 86.6  PLT 116* 103* 171 -- --   Cardiac Enzymes: No results found for this basename: CKTOTAL:5,CKMB:5,CKMBINDEX:5,TROPONINI:5 in the last 168 hours CBG:  Lab 12/23/10 0742 12/23/10 0348 12/22/10 2341 12/22/10 1940 12/22/10 1601  GLUCAP 104* 111* 102* 109* 112*    Iron Studies: No results found for this basename: IRON,TIBC,TRANSFERRIN,FERRITIN in the last 72 hours Studies/Results: Dg Chest Port 1 View  12/22/2010  *RADIOLOGY REPORT*  Clinical Data: Endotracheal tube placement  PORTABLE CHEST - 1 VIEW  Comparison: 12/21/2010  Findings: Cardiomediastinal silhouette is stable.  Slight  worsening congestion/pulmonary edema.  Stable endotracheal tube position with tip 5.2 cm above the carina.  Right dialysis catheter is unchanged in position.  Stable left subclavian central line position. Probable bilateral small pleural effusion and bilateral basilar atelectasis or infiltrate.  IMPRESSION:  Slight worsening congestion/pulmonary edema.  Stable endotracheal tube position with tip 5.2 cm above the carina.  Right dialysis catheter is unchanged in position.  Stable left subclavian central line position.  Probable bilateral small pleural effusion and bilateral basilar atelectasis or infiltrate.  Original Report Authenticated By: Natasha Mead, M.D.   Medications: Infusions:    . sodium chloride 10 mL/hr at 12/22/10 2254  . dextrose 40 mL/hr at 12/22/10 1530  . feeding supplement (NEPRO CARB STEADY) 1,000 mL (12/23/10 0616)  . propofol      Scheduled Medications:    . amLODipine  5 mg Per Tube Daily  . antiseptic oral rinse  1 application Mouth Rinse QID  . chlorhexidine  15 mL Mouth/Throat BID  . darbepoetin (ARANESP) injection - DIALYSIS  60 mcg Intravenous Q Thu-HD  . feeding supplement  30 mL Per Tube BID  . heparin  1,000 Units Intracatheter Once  . levalbuterol  0.63 mg Nebulization QID  .  metoprolol tartrate  25 mg Oral Q6H  . pantoprazole sodium  40 mg Per Tube Q24H  . paricalcitol  2 mcg Intravenous Q T,Th,Sa-HD  . paricalcitol  2 mcg Intravenous Once in dialysis  . piperacillin-tazobactam (ZOSYN)  IV  2.25 g Intravenous Q8H  . vancomycin  750 mg Intravenous Once  . vancomycin  750 mg Intravenous Q T,Th,Sa-HD  . DISCONTD: amLODipine  10 mg Oral Daily  . DISCONTD: amLODipine  5 mg Oral Daily    have reviewed scheduled and prn medications.  Physical Exam: General: intubated, will open eyes and no commands today.  Looks agitated in the bed today Heart: RRR Lungs: mostly clr Abdomen: soft, NT Extremities: pitting edema to dependant Dialysis Access: PC on right    I Assessment/ Plan: Pt is a 77 y.o. yo male ESRD who was admitted on 12/17/2010 with  S/p code on HD  Assessment/Plan: 1. S/p cardiac arrest-  Cardiomyopathy at baseline.  Is holding his own.  Per the family no escalation of care.  Awaiting neuro recs.  Apparently family not wanting to go with trach/peg 2. ESRD- usual days TTS via PC.  Monday had extra tx, now back on schedule  3. Anemia- hgb 8.8, on aranesp.   iron stores pending, transfuse as needed 4. Secondary hyperparathyroidism- zemplar only due to intubated status, phos is low 5. HTN/volume- very overloaded, will try again to decrease his D10 to [facility] so can keep up.  Hypertensive likely from volume, will attempt to decrease BP meds so can be more successful with volume removal.  Big goal dialed in for HD today 6. Neuro- making small strides daily per neurology 7. Hypokalemia- running on high K bath, trying to keep above 4 for cardiac reasons.   Athalee Esterline A   12/23/2010,8:17 AM  LOS: 10 days

## 2010-12-24 ENCOUNTER — Inpatient Hospital Stay (HOSPITAL_COMMUNITY): Payer: Medicare Other

## 2010-12-24 LAB — GLUCOSE, CAPILLARY
Glucose-Capillary: 100 mg/dL — ABNORMAL HIGH (ref 70–99)
Glucose-Capillary: 105 mg/dL — ABNORMAL HIGH (ref 70–99)
Glucose-Capillary: 92 mg/dL (ref 70–99)

## 2010-12-24 LAB — BLOOD GAS, ARTERIAL
Acid-Base Excess: 3.9 mmol/L — ABNORMAL HIGH (ref 0.0–2.0)
FIO2: 0.4 %
MECHVT: 500 mL
O2 Saturation: 99.7 %
Patient temperature: 98.6
RATE: 12 resp/min
TCO2: 28.3 mmol/L (ref 0–100)

## 2010-12-24 LAB — BASIC METABOLIC PANEL
CO2: 28 mEq/L (ref 19–32)
Chloride: 99 mEq/L (ref 96–112)
GFR calc Af Amer: 23 mL/min — ABNORMAL LOW (ref >90)
Potassium: 3.3 mEq/L — ABNORMAL LOW (ref 3.5–5.1)

## 2010-12-24 LAB — CBC
Platelets: 114 10*3/uL — ABNORMAL LOW (ref 150–400)
RBC: 2.58 MIL/uL — ABNORMAL LOW (ref 4.22–5.81)
RDW: 18.3 % — ABNORMAL HIGH (ref 11.5–15.5)
WBC: 6.5 10*3/uL (ref 4.0–10.5)

## 2010-12-24 LAB — PARATHYROID HORMONE, INTACT (NO CA): PTH: 65.4 pg/mL (ref 14.0–72.0)

## 2010-12-24 LAB — PHOSPHORUS: Phosphorus: 1.7 mg/dL — ABNORMAL LOW (ref 2.3–4.6)

## 2010-12-24 MED ORDER — HEPARIN SODIUM (PORCINE) 1000 UNIT/ML DIALYSIS: 20.0000 [IU]/kg | INTRAMUSCULAR | Status: DC | PRN

## 2010-12-24 MED ORDER — POTASSIUM CHLORIDE 20 MEQ/15ML (10%) PO LIQD
20.0000 meq | Freq: Once | ORAL | Status: AC
  Administered 2010-12-24: 20 meq via ORAL
  Filled 2010-12-24: qty 15

## 2010-12-24 NOTE — Progress Notes (Signed)
Subjective:  Patient remains intubated. Failed to wean of respirator today. On HD.  Per critical care note family and decided on no trach/peg and once extubated do not re-intubate.    Objective: Vital signs in last 24 hours: Temp:  [98.4 F (36.9 C)-101 F (38.3 C)] 101 F (38.3 C) (12/05 0800) Pulse Rate:  [30-111] 93  (12/05 0337) Resp:  [12-26] 16  (12/05 0900) BP: (107-151)/(56-105) 124/65 mmHg (12/05 0900) SpO2:  [84 %-100 %] 100 % (12/05 0900) FiO2 (%):  [39 %-92.3 %] 40 % (12/05 0900) Weight:  [69.8 kg (153 lb 14.1 oz)-70.8 kg (156 lb 1.4 oz)] 156 lb 1.4 oz (70.8 kg) (12/05 0300)  Intake/Output from previous day: 12/04 0701 - 12/05 0700 In: 2055 [I.V.:765; NG/GT:1140; IV Piggyback:150] Out: 4118  Intake/Output this shift: Total I/O In: 35 [NG/GT:35] Out: -  Nutritional status:    Past Medical History  Diagnosis Date  . Chronic systolic heart failure 09/06/2009  . ESRD on hemodialysis     Tues, Thurs, Sat  . Cardiomyopathy   . HTN (hypertension)   . Hyperlipemia   . Anemia     Multifactorial - iron deficiency and anemia of chronic disease in setting of ESRD. Last anemia panel (07/2005 ) - Fe 16, TIBC  203, Ferritin 350.  Marland Kitchen Gout   . Tricuspid regurgitation   . Thrombus     H/o apical thrombus  . Cardiac arrest 12/09/2007    with pulseless electrical activity 2/2 severe hyperkalemia (K > 7.5)  . Thyroid disease   . COPD (chronic obstructive pulmonary disease)   . GERD (gastroesophageal reflux disease)   . Cancer     prostate  . Rhabdomyolysis     H/O - secondary to blunt trauma to the buttock   . Secondary hyperparathyroidism   . 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 Chronic idiopathic thrombocytopenia     BL platelet 100-120.  Marland Kitchen Syncope     H/O multiple syncopal episodes thought to be vasovagal versus hypotension induced. Last episode  10/2009    Neurologic Exam: Mental Status:  Patient opens eyes to voice and  external stimuli. I did not see him track other people in the roo. He will look to the left and the right when people are talking to him but not follow simple commands.  He will spontaneously lift right arm and bilateral legs and squeeze hands bilaterally R>L Cranial Nerves:  II: patient does not respond to confrontation bilaterally, pupils right 3 mm, left 3 mm,and reactive bilaterally  III,IV,VI: doll's response present bilaterally.  V,VII: corneal reflex present bilaterally  VIII: grossly intact  IX,X: gag reflex unable to test. Patient intubated.  XI: trapezius strength unable to test bilaterally    Motor:  Continues to lift both arms against gravity (right greater than left) Moves both lower extremities in the bed and is able to lift each leg off of the bed fairly symmetrically.  His tone is increased on the right compared to the left.  Most notably in the arm. Sensory:  Responds to noxious stimuli in all extremities. Pulls both arms up to his chest. Withdraws legs to painful stimuli.  Deep Tendon Reflexes:  Absent throughout.  Plantars:  upgoing bilaterally  Cerebellar:  Unable to perform  Lab Results: Lab Results  Component Value Date/Time   CHOL  Value: 146        ATP III CLASSIFICATION:  <200     mg/dL  Desirable  200-239  mg/dL   Borderline High  >=454    mg/dL   High        09/81/1914  3:35 AM   Lipid Panel No results found for this basename: CHOL,TRIG,HDL,CHOLHDL,VLDL,LDLCALC in the last 72 hours  Studies/Results: Dg Chest Port 1 View  12/24/2010  *RADIOLOGY REPORT*  Clinical Data: Assess endotracheal tube.  PORTABLE CHEST - 1 VIEW  Comparison: 12/23/2010.  Findings: Endotracheal tube, nasogastric tube and left subclavian central line appear unchanged.  Right IJ dialysis catheter again noted.  Dense retrocardiac density is present compatible with atelectasis, airspace disease and left pleural effusion.  Slight worsening of aeration at the right lung base probably represents  either edema or atelectasis.  This may represent a combination of both.  IMPRESSION:  1.  Stable support apparatus in good position. 2.  Worsening of aeration at the right lung base, likely increasing atelectasis.  Original Report Authenticated By: Andreas Newport, M.D.   Dg Chest Port 1 View  12/23/2010  *RADIOLOGY REPORT*  Clinical Data: Endotracheal tube placement  PORTABLE CHEST - 1 VIEW  Comparison: Portable chest x-ray of 12/22/2010  Findings: The tip of the endotracheal tube is approximately 3.8 cm above the carina.  Aeration has improved with improvement in pulmonary vascular congestion as well.  There is still persistent opacity at the left lung base which may represent atelectasis, effusion, or possibly pneumonia.  Mild cardiomegaly is stable. Left and right central venous lines remain.  IMPRESSION:  1.  Improved aeration and improved pulmonary vascular congestion. 2.  Tip of endotracheal tube 3.8 cm above carina. 3.  Persistent left basilar opacity.  Original Report Authenticated By: Juline Patch, M.D.    Medications:  Prior to Admission:  Prescriptions prior to admission  Medication Sig Dispense Refill  . aspirin 81 MG tablet Take 81 mg by mouth daily.       . B Complex-C-Folic Acid (RENA-VITE PO) Take 1 tablet by mouth daily.        . carvedilol (COREG) 12.5 MG tablet Take 12.5 mg by mouth 2 (two) times daily with a meal.        . chlorproMAZINE (THORAZINE) 25 MG tablet Take 25 mg by mouth as needed.        . finasteride (PROSCAR) 5 MG tablet Take 5 mg by mouth daily.        . isosorbide mononitrate (IMDUR) 30 MG 24 hr tablet Take 30 mg by mouth daily.        Marland Kitchen omeprazole (PRILOSEC) 40 MG capsule Take 40 mg by mouth daily.         Scheduled:   . amLODipine  10 mg Per Tube QHS  . antiseptic oral rinse  1 application Mouth Rinse QID  . chlorhexidine  15 mL Mouth/Throat BID  . darbepoetin (ARANESP) injection - DIALYSIS  60 mcg Intravenous Q Thu-HD  . dextrose      . feeding  supplement  30 mL Per Tube BID  . levalbuterol  0.63 mg Nebulization QID  . metoprolol tartrate  25 mg Oral Q6H  . pantoprazole sodium  40 mg Per Tube Q24H  . paricalcitol  2 mcg Intravenous Q T,Th,Sa-HD  . DISCONTD: amLODipine  5 mg Per Tube Daily  . DISCONTD: piperacillin-tazobactam (ZOSYN)  IV  2.25 g Intravenous Q8H  . DISCONTD: vancomycin  750 mg Intravenous Q T,Th,Sa-HD    Assessment/Plan:  Patient Active Hospital Problem List: Cardiopulmonary arrest (12/15/2010)   Assessment: Will continue to follow  his progress.  Continues to be unclear how much patient will improve.  Patient seemed to show less responsiveness on today's exam.      Left sided weakness--Imaging from 11/24 shows evidence of an old right thalamic infarct but no evidence of hemorrhage.  continue to recommend MRI when extubated.     Felicie Morn PA-C [facility] [phone]  12/24/2010, 10:03 AM

## 2010-12-24 NOTE — Progress Notes (Signed)
HPI:  Jackson [name] is a 77 y.o. male admitted on 2010-12-20 with cardiac arrest after HD. Had shockable rhythm per AED applied by paramedics.  PMHx Cardiomyopathy with EF 30%, ESRD on HD T/Th/Sa, HTN, Hyperlipidemia, Anemia, Gout, TR  Antibiotics:   Zosyn 11/26>>12/5 Vancomycin 11/26>>12/5  Cultures/Sepsis Markers:   Sputum 11/26>>>NTD Blood 11/27>>>NTD  Access/Protocols:  11/24 R tunneled HD catheter (preadmission)  11/24 ETT>>> 11/24 OGT>>>  11/24 L [place] TLC>>>  11/25 Lt Radial [place]>>>12/1  Best Practice: DVT: Hep SQ GI: Protonix  11/24 Cardiac arrest post HD, ACLS, hypothermia protocol started and complete.  Subjective: Rewarmed, no events overnight.  Physical Exam: Filed Vitals:   12/24/10 0900  BP: 124/65  Pulse:   Temp:   Resp: 16    Intake/Output Summary (Last 24 hours) at 12/24/10 0936 Last data filed at 12/24/10 0800  Gross per 24 hour  Intake   1960 ml  Output   4118 ml  Net  -2158 ml   Vent Mode:  [-] PRVC FiO2 (%):  [39 %-92.3 %] 40 % Set Rate:  [12 bmp] 12 bmp Vt Set:  [500 mL] 500 mL PEEP:  [5 cmH20-6 cmH20] 6 cmH20 Pressure Support:  [10 cmH20] 10 cmH20 Plateau Pressure:  [18 cmH20-26 cmH20] 25 cmH20  Neuro: Eyes closed, but patient is following commands. Cardiac: RRR, Nl S1/S2, -M/R/G. Pulmonary: Coarse BS diffusely. GI: Soft, NT, ND and +BS. Extremities: 2+ edema and -tenderness.  Labs: CBC    Component Value Date/Time   WBC 6.5 12/24/2010 0355   RBC 2.58* 12/24/2010 0355   HGB 7.5* 12/24/2010 0355   HCT 23.1* 12/24/2010 0355   PLT 114* 12/24/2010 0355   MCV 89.5 12/24/2010 0355   MCH 29.1 12/24/2010 0355   MCHC 32.5 12/24/2010 0355   RDW 18.3* 12/24/2010 0355   LYMPHSABS 3.4 12-20-10 1257   MONOABS 0.4 20-Dec-2010 1257   EOSABS 0.3 12/20/10 1257   BASOSABS 0.1 12-20-2010 1257   BMET    Component Value Date/Time   NA 133* 12/24/2010 0355   K 3.3* 12/24/2010 0355   CL 99 12/24/2010 0355   CO2 28 12/24/2010 0355   GLUCOSE 95  12/24/2010 0355   BUN 23 12/24/2010 0355   CREATININE 2.77* 12/24/2010 0355   CALCIUM 7.9* 12/24/2010 0355   GFRNONAA 20* 12/24/2010 0355   GFRAA 23* 12/24/2010 0355   ABG    Component Value Date/Time   PHART 7.490* 12/24/2010 0325   PCO2ART 36.0 12/24/2010 0325   PO2ART 130.0* 12/24/2010 0325   HCO3 27.2* 12/24/2010 0325   TCO2 28.3 12/24/2010 0325   ACIDBASEDEF 3.2* 12/20/2010 0320   O2SAT 99.7 12/24/2010 0325    Lab 12/24/10 0355  MG 1.9   Lab Results  Component Value Date   CALCIUM 7.9* 12/24/2010   PHOS 1.7* 12/24/2010   Chest X-ray: ET tube in good position, improving pulmonary edema.  Assessment & Plan:  VDRF in setting of cardiac arrest.   - Maintain on PS as tolerated, full vent support at night then PS again in AM. - F/u CXR. - Bronchial hygiene.  Purulent bronchitis with ?PNA.  - F/U on cultures. - D8/x vancomycin and zosyn.  Shock:  Now resolved.  Actually hypertensive now. - Per family discussion no further escalation of care, CPR, cardioversion or pressors on 11/28. - Continue beta blockers PO today. - Increased nifedipine to 10 mg PO daily.  Cardiac arrest with presumed VF with hx of systolic CHF and post-dialysis hypokalemia  - Rewarmed  11/26. - Continue beta blocker. - Inc norvac for BP control.  ESRD  - HD per renal, would benefit from more negative balance.  Anoxic encephalopathy: No sedation, following commands. - Monitor daily. - Neuro following. - Spoke with family, they are leaning towards the no trach/peg component but would like to continue care until last possible moment prior to extubation.  I informed them that we will continue to wean and will communicate again on Tuesday after speaking with neuro with our final recommendations.  Anemia  - F/U CBC. - Transfuse for Hb < 7.   Thrombocytopenia, resolved. - F/U CBC.   Hypoglycemia  - Monitor CBG's.  - Restart TF today.   Best practices / Disposition  - SCDs for DVT Px  - Protonix for GI  Px   Spoke with the daughter over the phone, informed her that the patient is improving and giving him a couple more days is appropriate.  She informed me that they met as a family and decided on no trach/peg and once extubated do not re-intubate.  Will get in touch with her again on Thursday.  The patient is critically ill with multiple organ systems failure and requires high complexity decision making for assessment and support, frequent evaluation and titration of therapies, application of advanced monitoring technologies and extensive interpretation of multiple databases. Critical Care Time devoted to patient care services described in this note is 40 minutes.  Koren Bound, M.D.

## 2010-12-24 NOTE — Progress Notes (Signed)
S:awake, intubated; RN reports pt following some commands O:BP 124/65  Pulse 91  Temp(Src) 101 F (38.3 C) (Oral)  Resp 16  Ht 5\' 11"  (1.803 m)  Wt 70.8 kg (156 lb 1.4 oz)  BMI 21.77 kg/m2  SpO2 100%  Intake/Output Summary (Last 24 hours) at 12/24/10 1144 Last data filed at 12/24/10 0800  Gross per 24 hour  Intake   1870 ml  Output      0 ml  Net   1870 ml   Weight change: -2.7 kg (-5 lb 15.2 oz) ZOX:WRUEA eyes, Ext:*tr - 1+ edema Moves all extremities, prob less on left side     . amLODipine  10 mg Per Tube QHS  . antiseptic oral rinse  1 application Mouth Rinse QID  . chlorhexidine  15 mL Mouth/Throat BID  . darbepoetin (ARANESP) injection - DIALYSIS  60 mcg Intravenous Q Thu-HD  . feeding supplement  30 mL Per Tube BID  . levalbuterol  0.63 mg Nebulization QID  . metoprolol tartrate  25 mg Oral Q6H  . pantoprazole sodium  40 mg Per Tube Q24H  . paricalcitol  2 mcg Intravenous Q T,Th,Sa-HD  . DISCONTD: piperacillin-tazobactam (ZOSYN)  IV  2.25 g Intravenous Q8H  . DISCONTD: vancomycin  750 mg Intravenous Q T,Th,Sa-HD   Dg Chest Port 1 View  12/24/2010  *RADIOLOGY REPORT*  Clinical Data: Assess endotracheal tube.  PORTABLE CHEST - 1 VIEW  Comparison: 12/23/2010.  Findings: Endotracheal tube, nasogastric tube and left subclavian central line appear unchanged.  Right IJ dialysis catheter again noted.  Dense retrocardiac density is present compatible with atelectasis, airspace disease and left pleural effusion.  Slight worsening of aeration at the right lung base probably represents either edema or atelectasis.  This may represent a combination of both.  IMPRESSION:  1.  Stable support apparatus in good position. 2.  Worsening of aeration at the right lung base, likely increasing atelectasis.  Original Report Authenticated By: Andreas Newport, M.D.   Dg Chest Port 1 View  12/23/2010  *RADIOLOGY REPORT*  Clinical Data: Endotracheal tube placement  PORTABLE CHEST - 1 VIEW   Comparison: Portable chest x-ray of 12/22/2010  Findings: The tip of the endotracheal tube is approximately 3.8 cm above the carina.  Aeration has improved with improvement in pulmonary vascular congestion as well.  There is still persistent opacity at the left lung base which may represent atelectasis, effusion, or possibly pneumonia.  Mild cardiomegaly is stable. Left and right central venous lines remain.  IMPRESSION:  1.  Improved aeration and improved pulmonary vascular congestion. 2.  Tip of endotracheal tube 3.8 cm above carina. 3.  Persistent left basilar opacity.  Original Report Authenticated By: Juline Patch, M.D.   [facility]  Lab 12/24/10 0355 12/23/10 0425 12/22/10 0300 12/21/10 0346 12/20/10 0500 12/19/10 0355 12/18/10 2015 12/18/10 0341  NA 133* 132* 130* 128* 126* 128* -- 121*  K 3.3* 3.2* 3.1* 4.5 3.7 4.3 -- 3.3*  CL 99 97 96 96 93* 95* -- 88*  CO2 28 27 23 24 23 24  -- 23  GLUCOSE 95 107* 118* 105* 113* 113* 94 --  BUN 23 28* 29* 17 29* 17 -- 27*  CREATININE 2.77* 3.55* 3.88* 2.63* 3.94* 3.07* -- 4.02*  ALB -- -- -- -- -- -- -- --  CALCIUM 7.9* 8.0* 8.1* 7.5* 7.8* 7.6* -- 7.5*  PHOS 1.7* 2.0* 2.4 2.2* 3.0 2.7 -- 3.1   CBC  Lab 12/24/10 0355 12/23/10 0425 12/22/10 0300 12/21/10 0346  WBC 6.5 10.2 10.0 7.6  NEUTROABS -- -- -- --  HGB 7.5* 8.0* 8.8* 8.7*  HCT 23.1* 23.2* 26.0* 25.0*  MCV 89.5 86.9 88.4 85.6  PLT 114* 116* 103* 171     Assessment/Plan: 1. S/p cardiac arrest-VDRF Cardiomyopathy at baseline. Is holding his own. Per the family no escalation of care. Apparently family not wanting to go with trach/peg  2. ESRD- usual days TTS via PC. Monday had extra tx, now back on schedule, next tx in AM  3. Anemia-, on aranesp. iron stores pending, transfuse as needed  4. Secondary hyperparathyroidism- zemplar only due to intubated status, phos is low  5. HTN/volume- very overloaded, will try again to decrease his D10 to [facility] so can keep up. Hypertensive likely from volume,  will attempt to decrease BP meds so can be more successful with volume removal. Big goal dialed in for HD today  6. Neuro- making small strides daily per neurology  7. Hypokalemia- running on high K bath, trying to keep above 4 for cardiac reasons.    Goodwin Kamphaus C

## 2010-12-25 ENCOUNTER — Inpatient Hospital Stay (HOSPITAL_COMMUNITY): Payer: Medicare Other

## 2010-12-25 DIAGNOSIS — J9600 Acute respiratory failure, unspecified whether with hypoxia or hypercapnia: Secondary | ICD-10-CM

## 2010-12-25 DIAGNOSIS — N186 End stage renal disease: Secondary | ICD-10-CM

## 2010-12-25 DIAGNOSIS — I4901 Ventricular fibrillation: Secondary | ICD-10-CM

## 2010-12-25 DIAGNOSIS — I469 Cardiac arrest, cause unspecified: Secondary | ICD-10-CM

## 2010-12-25 LAB — BLOOD GAS, ARTERIAL
Bicarbonate: 25.3 mEq/L — ABNORMAL HIGH (ref 20.0–24.0)
Drawn by: 331761
O2 Saturation: 99.9 %
PEEP: 5 cmH2O
RATE: 12 resp/min
pCO2 arterial: 33.7 mmHg — ABNORMAL LOW (ref 35.0–45.0)
pH, Arterial: 7.492 — ABNORMAL HIGH (ref 7.350–7.450)
pO2, Arterial: 161 mmHg — ABNORMAL HIGH (ref 80.0–100.0)

## 2010-12-25 LAB — GLUCOSE, CAPILLARY
Glucose-Capillary: 103 mg/dL — ABNORMAL HIGH (ref 70–99)
Glucose-Capillary: 60 mg/dL — ABNORMAL LOW (ref 70–99)
Glucose-Capillary: 85 mg/dL (ref 70–99)
Glucose-Capillary: 91 mg/dL (ref 70–99)
Glucose-Capillary: 89 mg/dL (ref 70–99)

## 2010-12-25 LAB — BASIC METABOLIC PANEL
BUN: 40 mg/dL — ABNORMAL HIGH (ref 6–23)
CO2: 26 mEq/L (ref 19–32)
Calcium: 8.2 mg/dL — ABNORMAL LOW (ref 8.4–10.5)
Creatinine, Ser: 4.18 mg/dL — ABNORMAL HIGH (ref 0.50–1.35)
GFR calc non Af Amer: 12 mL/min — ABNORMAL LOW (ref >90)
Glucose, Bld: 93 mg/dL (ref 70–99)

## 2010-12-25 LAB — CBC
Hemoglobin: 7.3 g/dL — ABNORMAL LOW (ref 13.0–17.0)
MCH: 29 pg (ref 26.0–34.0)
MCHC: 32.6 g/dL (ref 30.0–36.0)
MCV: 88.9 fL (ref 78.0–100.0)
RBC: 2.52 MIL/uL — ABNORMAL LOW (ref 4.22–5.81)

## 2010-12-25 LAB — PHOSPHORUS: Phosphorus: 2.1 mg/dL — ABNORMAL LOW (ref 2.3–4.6)

## 2010-12-25 LAB — MAGNESIUM: Magnesium: 1.8 mg/dL (ref 1.5–2.5)

## 2010-12-25 MED ORDER — HEPARIN SODIUM (PORCINE) 1000 UNIT/ML DIALYSIS: 20.0000 [IU]/kg | INTRAMUSCULAR | Status: DC | PRN

## 2010-12-25 MED ORDER — MAGNESIUM SULFATE 40 MG/ML IJ SOLN
2.0000 g | Freq: Once | INTRAMUSCULAR | Status: AC
  Administered 2010-12-25: 2 g via INTRAVENOUS
  Filled 2010-12-25: qty 50

## 2010-12-25 MED ORDER — PNEUMOCOCCAL VAC POLYVALENT 25 MCG/0.5ML IJ INJ
0.5000 mL | INJECTION | INTRAMUSCULAR | Status: AC
  Administered 2010-12-26: 0.5 mL via INTRAMUSCULAR
  Filled 2010-12-25: qty 0.5

## 2010-12-25 MED ORDER — SODIUM CHLORIDE 0.9 % IV SOLN: 500.0000 mg | Freq: Two times a day (BID) | INTRAVENOUS | Status: DC

## 2010-12-25 MED ORDER — POTASSIUM PHOSPHATE MONOBASIC 500 MG PO TABS
1000.0000 mg | ORAL_TABLET | Freq: Three times a day (TID) | ORAL | Status: AC
  Administered 2010-12-25 – 2010-12-27 (×9): 1000 mg
  Filled 2010-12-25 (×10): qty 2

## 2010-12-25 MED ORDER — DEXTROSE 50 % IV SOLN
25.0000 mL | Freq: Once | INTRAVENOUS | Status: AC | PRN
  Administered 2010-12-25: 25 mL via INTRAVENOUS

## 2010-12-25 MED ORDER — VANCOMYCIN HCL 1000 MG IV SOLR
750.0000 mg | INTRAVENOUS | Status: DC
  Administered 2010-12-25 – 2011-01-01 (×4): 750 mg via INTRAVENOUS
  Filled 2010-12-25 (×7): qty 750

## 2010-12-25 MED ORDER — VANCOMYCIN HCL 1000 MG IV SOLR: 750.0000 mg | INTRAVENOUS | Status: DC

## 2010-12-25 MED ORDER — PARICALCITOL 5 MCG/ML IV SOLN
INTRAVENOUS | Status: AC
  Administered 2010-12-25: 2 ug via INTRAVENOUS
  Filled 2010-12-25: qty 1

## 2010-12-25 MED ORDER — SODIUM CHLORIDE 0.9 % IV SOLN
250.0000 mg | Freq: Two times a day (BID) | INTRAVENOUS | Status: DC
  Administered 2010-12-25 – 2011-01-04 (×20): 250 mg via INTRAVENOUS
  Filled 2010-12-25 (×22): qty 250

## 2010-12-25 MED ORDER — DEXTROSE 50 % IV SOLN
INTRAVENOUS | Status: AC
  Administered 2010-12-25: 25 mL via INTRAVENOUS
  Filled 2010-12-25: qty 50

## 2010-12-25 MED ORDER — MAGNESIUM SULFATE BOLUS VIA INFUSION: 2.0000 g | Freq: Once | INTRAVENOUS | Status: DC

## 2010-12-25 MED ORDER — METRONIDAZOLE 500 MG PO TABS
500.0000 mg | ORAL_TABLET | Freq: Three times a day (TID) | ORAL | Status: DC
  Filled 2010-12-25 (×2): qty 1

## 2010-12-25 MED ORDER — DARBEPOETIN ALFA-POLYSORBATE 60 MCG/0.3ML IJ SOLN
INTRAMUSCULAR | Status: AC
  Administered 2010-12-25: 60 ug via INTRAVENOUS
  Filled 2010-12-25: qty 0.3

## 2010-12-25 MED ORDER — METRONIDAZOLE 50 MG/ML ORAL SUSPENSION
500.0000 mg | Freq: Three times a day (TID) | ORAL | Status: DC
  Administered 2010-12-25: 500 mg
  Filled 2010-12-25 (×5): qty 10

## 2010-12-25 NOTE — Progress Notes (Signed)
Subjective:  Patient remains intubated.  Appears more alert today and following simple one step commands.  Currently on HD.   Objective: Vital signs in last 24 hours: Temp:  [98.5 F (36.9 C)-100.9 F (38.3 C)] 99.3 F (37.4 C) (12/06 0945) Pulse Rate:  [79-98] 85  (12/06 0945) Resp:  [12-22] 20  (12/06 0945) BP: (108-144)/(60-87) 138/87 mmHg (12/06 0945) SpO2:  [95 %-100 %] 98 % (12/06 0800) FiO2 (%):  [29.8 %-40.5 %] 29.8 % (12/06 0800) Weight:  [71.4 kg (157 lb 6.5 oz)-71.7 kg (158 lb 1.1 oz)] 157 lb 6.5 oz (71.4 kg) (12/06 0721)  Intake/Output from previous day: 12/05 0701 - 12/06 0700 In: 1975 [I.V.:900; NG/GT:1075] Out: -  Intake/Output this shift: Total I/O In: 435 [I.V.:20; Blood:350; NG/GT:65] Out: -  Nutritional status:    Past Medical History  Diagnosis Date  . Chronic systolic heart failure 09/06/2009  . ESRD on hemodialysis     Tues, Thurs, Sat  . Cardiomyopathy   . HTN (hypertension)   . Hyperlipemia   . Anemia     Multifactorial - iron deficiency and anemia of chronic disease in setting of ESRD. Last anemia panel (07/2005 ) - Fe 16, TIBC  203, Ferritin 350.  Marland Kitchen Gout   . Tricuspid regurgitation   . Thrombus     H/o apical thrombus  . Cardiac arrest 12/09/2007    with pulseless electrical activity 2/2 severe hyperkalemia (K > 7.5)  . Thyroid disease   . COPD (chronic obstructive pulmonary disease)   . GERD (gastroesophageal reflux disease)   . Cancer     prostate  . Rhabdomyolysis     H/O - secondary to blunt trauma to the buttock   . Secondary hyperparathyroidism   . 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 Chronic idiopathic thrombocytopenia     BL platelet 100-120.  Marland Kitchen Syncope     H/O multiple syncopal episodes thought to be vasovagal versus hypotension induced. Last episode  10/2009    Neurologic Exam: Mental Status: Alert, intubated. He will track my movements and voice. Able to follow 1 step commands such  as reaching for my hands, wiggle toes and bend his legs. Cranial Nerves: II-Visual fields grossly intact-blinks to threat III/IV/VI-Extraocular movements intact.  Pupils reactive bilaterally. V/VII-Smile symmetric VIII-grossly intact IX/X-normal gag XI-bilateral shoulder shrug XII-midline tongue extension Motor: 4/5 bilaterally LE and 4/5 right ,still shows weakness in the Left upper extremity.  Sensory: withdraws to pain in all 4 extremities Deep Tendon Reflexes: 2+ and symmetric throughout Plantars: Downgoing bilaterally Cerebellar: not able to test   Lab Results: Lab Results  Component Value Date/Time   CHOL  Value: 146        ATP III CLASSIFICATION:  <200     mg/dL   Desirable  409-811  mg/dL   Borderline High  >=914    mg/dL   High        78/29/5621  3:35 AM   Lipid Panel No results found for this basename: CHOL,TRIG,HDL,CHOLHDL,VLDL,LDLCALC in the last 72 hours  Studies/Results: Dg Chest Port 1 View  12/25/2010  *RADIOLOGY REPORT*  Clinical Data: Endotracheal tube placement, shortness of breath  PORTABLE CHEST - 1 VIEW  Comparison: Portable chest x-ray of 12/24/2010  Findings: There has been an increase in opacity at the lung bases. This may reflect atelectasis and effusions, but pneumonia cannot be excluded.  Cardiomegaly is stable.  Endotracheal tube, left central venous line, and right central  venous line are unchanged in position.  IMPRESSION: Increase in basilar opacities left greater than right.  Possible atelectasis and effusions but cannot exclude pneumonia.  Original Report Authenticated By: Juline Patch, M.D.   Dg Chest Port 1 View  12/24/2010  *RADIOLOGY REPORT*  Clinical Data: Assess endotracheal tube.  PORTABLE CHEST - 1 VIEW  Comparison: 12/23/2010.  Findings: Endotracheal tube, nasogastric tube and left subclavian central line appear unchanged.  Right IJ dialysis catheter again noted.  Dense retrocardiac density is present compatible with atelectasis, airspace  disease and left pleural effusion.  Slight worsening of aeration at the right lung base probably represents either edema or atelectasis.  This may represent a combination of both.  IMPRESSION:  1.  Stable support apparatus in good position. 2.  Worsening of aeration at the right lung base, likely increasing atelectasis.  Original Report Authenticated By: Andreas Newport, M.D.    Medications:  Scheduled:   . amLODipine  10 mg Per Tube QHS  . antiseptic oral rinse  1 application Mouth Rinse QID  . chlorhexidine  15 mL Mouth/Throat BID  . darbepoetin      . darbepoetin (ARANESP) injection - DIALYSIS  60 mcg Intravenous Q Thu-HD  . feeding supplement  30 mL Per Tube BID  . levalbuterol  0.63 mg Nebulization QID  . magnesium sulfate IVPB  2 g Intravenous Once  . metoprolol tartrate  25 mg Oral Q6H  . pantoprazole sodium  40 mg Per Tube Q24H  . paricalcitol      . paricalcitol  2 mcg Intravenous Q T,Th,Sa-HD  . potassium chloride  20 mEq Oral Once  . potassium phosphate (monobasic)  1,000 mg Per Tube TID WC & HS  . DISCONTD: magnesium  2 g Intravenous Once  . DISCONTD: magnesium  2 g Intravenous Once    Assessment/Plan:  Patient Active Hospital Problem List:  Cardiopulmonary arrest (12/15/2010) Assessment: Will continue to follow his progress. Patient seemed to more responsive today and following simple one step commands.  Continues to show Left arm weakness. As stated in previous note, when stable recommend MRI.   Felicie Morn PA-C [facility] [phone]  12/25/2010, 9:56 AM

## 2010-12-25 NOTE — Progress Notes (Signed)
UR Completed.  Jackson [name] Jane [phone] 12/25/2010  

## 2010-12-25 NOTE — Progress Notes (Signed)
Nutrition Follow-up  Diet Order:  NPO TF: Nepro at 35 ml/hr, 30 mL ProStat BID, providing 1656 kcal, 98 g protein, 611 ml free water. Patient is tolerating.   Patient remains intubated. No trach or PEG desired by family.   Meds: Scheduled Meds:   . amLODipine  10 mg Per Tube QHS  . antiseptic oral rinse  1 application Mouth Rinse QID  . chlorhexidine  15 mL Mouth/Throat BID  . darbepoetin (ARANESP) injection - DIALYSIS  60 mcg Intravenous Q Thu-HD  . feeding supplement  30 mL Per Tube BID  . levalbuterol  0.63 mg Nebulization QID  . magnesium sulfate IVPB  2 g Intravenous Once  . metoprolol tartrate  25 mg Oral Q6H  . pantoprazole sodium  40 mg Per Tube Q24H  . paricalcitol  2 mcg Intravenous Q T,Th,Sa-HD  . pneumococcal 23 valent vaccine  0.5 mL Intramuscular Tomorrow-1000  . potassium chloride  20 mEq Oral Once  . potassium phosphate (monobasic)  1,000 mg Per Tube TID WC & HS  . DISCONTD: magnesium  2 g Intravenous Once  . DISCONTD: magnesium  2 g Intravenous Once   Continuous Infusions:   . sodium chloride Stopped (12/25/10 0400)  . dextrose 20 mL/hr at 12/25/10 0800  . feeding supplement (NEPRO CARB STEADY) 1,000 mL (12/25/10 1133)  . propofol     PRN Meds:.dextrose, fentaNYL, heparin, heparin, hydrALAZINE, levalbuterol  Labs:  CMP     Component Value Date/Time   NA 134* 12/25/2010 0500   K 3.3* 12/25/2010 0500   CL 98 12/25/2010 0500   CO2 26 12/25/2010 0500   GLUCOSE 93 12/25/2010 0500   BUN 40* 12/25/2010 0500   CREATININE 4.18* 12/25/2010 0500   CALCIUM 8.2* 12/25/2010 0500   PROT 5.1* 12/16/2010 0700   ALBUMIN 1.6* 12/17/2010 0406   AST 27 12/16/2010 0700   ALT 22 12/16/2010 0700   ALKPHOS 137* 12/16/2010 0700   BILITOT 0.5 12/16/2010 0700   GFRNONAA 12* 12/25/2010 0500   GFRAA 14* 12/25/2010 0500    Intake/Output Summary (Last 24 hours) at 12/25/10 1256 Last data filed at 12/25/10 1200  Gross per 24 hour  Intake   2730 ml  Output   3000 ml  Net   -270 ml    Weight Status:  68.6 kg  Nutrition Dx:  Inadequate oral intake, ongoing  Goal:  TF to meet 90-100% of estimated needs, met  Intervention:  Continue Nepro with ProStat BID.   Monitor:  TF tolerance/adequacy, weight trends, labs  Fabio Pierce Pager #:  [phone]

## 2010-12-25 NOTE — Progress Notes (Signed)
S:Intubated/awakens O:BP 131/78  Pulse 92  Temp(Src) 100.3 F (37.9 C) (Oral)  Resp 21  Ht 5\' 11"  (1.803 m)  Wt 71.7 kg (158 lb 1.1 oz)  BMI 22.05 kg/m2  SpO2 100%  Intake/Output Summary (Last 24 hours) at 12/25/10 0536 Last data filed at 12/25/10 0500  Gross per 24 hour  Intake   2015 ml  Output      0 ml  Net   2015 ml   Weight change: -1.8 kg (-3 lb 15.5 oz) ZOX:WRUEA, not sure if appropriate CVS:RRR Resp:clear VWU:JWJX BJY:NWGNF edema in extremities    . amLODipine  10 mg Per Tube QHS  . antiseptic oral rinse  1 application Mouth Rinse QID  . chlorhexidine  15 mL Mouth/Throat BID  . darbepoetin (ARANESP) injection - DIALYSIS  60 mcg Intravenous Q Thu-HD  . feeding supplement  30 mL Per Tube BID  . levalbuterol  0.63 mg Nebulization QID  . metoprolol tartrate  25 mg Oral Q6H  . pantoprazole sodium  40 mg Per Tube Q24H  . paricalcitol  2 mcg Intravenous Q T,Th,Sa-HD  . potassium chloride  20 mEq Oral Once  . DISCONTD: piperacillin-tazobactam (ZOSYN)  IV  2.25 g Intravenous Q8H  . DISCONTD: vancomycin  750 mg Intravenous Q T,Th,Sa-HD   Dg Chest Port 1 View  12/24/2010  *RADIOLOGY REPORT*  Clinical Data: Assess endotracheal tube.  PORTABLE CHEST - 1 VIEW  Comparison: 12/23/2010.  Findings: Endotracheal tube, nasogastric tube and left subclavian central line appear unchanged.  Right IJ dialysis catheter again noted.  Dense retrocardiac density is present compatible with atelectasis, airspace disease and left pleural effusion.  Slight worsening of aeration at the right lung base probably represents either edema or atelectasis.  This may represent a combination of both.  IMPRESSION:  1.  Stable support apparatus in good position. 2.  Worsening of aeration at the right lung base, likely increasing atelectasis.  Original Report Authenticated By: Andreas Newport, M.D.   Dg Chest Port 1 View  12/23/2010  *RADIOLOGY REPORT*  Clinical Data: Endotracheal tube placement  PORTABLE CHEST  - 1 VIEW  Comparison: Portable chest x-ray of 12/22/2010  Findings: The tip of the endotracheal tube is approximately 3.8 cm above the carina.  Aeration has improved with improvement in pulmonary vascular congestion as well.  There is still persistent opacity at the left lung base which may represent atelectasis, effusion, or possibly pneumonia.  Mild cardiomegaly is stable. Left and right central venous lines remain.  IMPRESSION:  1.  Improved aeration and improved pulmonary vascular congestion. 2.  Tip of endotracheal tube 3.8 cm above carina. 3.  Persistent left basilar opacity.  Original Report Authenticated By: Juline Patch, M.D.   BMET  Lab 12/24/10 0355 12/23/10 0425 12/22/10 0300 12/21/10 0346 12/20/10 0500 12/19/10 0355 12/18/10 2015  NA 133* 132* 130* 128* 126* 128* --  K 3.3* 3.2* 3.1* 4.5 3.7 4.3 --  CL 99 97 96 96 93* 95* --  CO2 28 27 23 24 23 24  --  GLUCOSE 95 107* 118* 105* 113* 113* 94  BUN 23 28* 29* 17 29* 17 --  CREATININE 2.77* 3.55* 3.88* 2.63* 3.94* 3.07* --  ALB -- -- -- -- -- -- --  CALCIUM 7.9* 8.0* 8.1* 7.5* 7.8* 7.6* --  PHOS 1.7* 2.0* 2.4 2.2* 3.0 2.7 --   CBC  Lab 12/25/10 0500 12/24/10 0355 12/23/10 0425 12/22/10 0300  WBC 5.7 6.5 10.2 10.0  NEUTROABS -- -- -- --  HGB 7.3* 7.5* 8.0* 8.8*  HCT 22.4* 23.1* 23.2* 26.0*  MCV 88.9 89.5 86.9 88.4  PLT 116* 114* 116* 103*  Assessment/Plan: 1. S/p cardiac arrest-VDRF Cardiomyopathy.   Apparently family not wanting to go with trach/peg  2. ESRD- usual days TTS via PC. HD tx today  3. Anemia-,ABLA. transfuse today to optimize               cardiopulmonary status preextubation 4. Secondary hyperparathyroidism- zemplar only due to intubated status, phos is low will replace 5. HTN/volume- improved,  6. Neuro- appears more alert 7. Hypokalemia- running on high K bath, trying to keep above 4 for cardiac reasons. K replace 8 Resp Alkalosis  Tawnie Ehresman C

## 2010-12-25 NOTE — Progress Notes (Signed)
HPI:  Jackson [name] is a 77 y.o. male admitted on 11/28/2010 with cardiac arrest after HD. Had shockable rhythm per AED applied by paramedics.  PMHx Cardiomyopathy with EF 30%, ESRD on HD T/Th/Sa, HTN, Hyperlipidemia, Anemia, Gout, TR  Antibiotics:   Zosyn 11/26>>12/5 Vancomycin 11/26>>12/5  Cultures/Sepsis Markers:   Sputum 11/26>>>NTD Blood 11/27>>>NTD  Access/Protocols:  11/24 R tunneled HD catheter (preadmission)  11/24 ETT>>> 11/24 OGT>>>  11/24 L Pierron TLC>>>  11/25 Lt Radial Aline>>>12/1  Best Practice: DVT: Hep SQ GI: Protonix  11/24 Cardiac arrest post HD, ACLS, hypothermia protocol started and complete.  Subjective: Rewarmed, no events overnight.    Physical Exam: Filed Vitals:   12/25/10 0916  BP: 131/72  Pulse: 94  Temp: 98.6 F (37 C)  Resp: 19    Intake/Output Summary (Last 24 hours) at 12/25/10 0930 Last data filed at 12/25/10 0916  Gross per 24 hour  Intake   2240 ml  Output      0 ml  Net   2240 ml   Vent Mode:  [-] PRVC FiO2 (%):  [29.8 %-40.5 %] 29.8 % Set Rate:  [12 bmp] 12 bmp Vt Set:  [500 mL] 500 mL PEEP:  [5 cmH20-6.3 cmH20] 5 cmH20 Plateau Pressure:  [18 cmH20-28 cmH20] 24 cmH20  Neuro: Eyes closed, but patient is following commands. Cardiac: RRR, Nl S1/S2, -M/R/G. Pulmonary: Coarse BS diffusely. GI: Soft, NT, ND and +BS. Extremities: 2+ edema and -tenderness.  Labs: CBC    Component Value Date/Time   WBC 5.7 12/25/2010 0500   RBC 2.52* 12/25/2010 0500   HGB 7.3* 12/25/2010 0500   HCT 22.4* 12/25/2010 0500   PLT 116* 12/25/2010 0500   MCV 88.9 12/25/2010 0500   MCH 29.0 12/25/2010 0500   MCHC 32.6 12/25/2010 0500   RDW 18.1* 12/25/2010 0500   LYMPHSABS 3.4 11/30/2010 1257   MONOABS 0.4 12/16/2010 1257   EOSABS 0.3 12/17/2010 1257   BASOSABS 0.1 12/09/2010 1257   BMET    Component Value Date/Time   NA 134* 12/25/2010 0500   K 3.3* 12/25/2010 0500   CL 98 12/25/2010 0500   CO2 26 12/25/2010 0500   GLUCOSE 93 12/25/2010 0500   BUN 40* 12/25/2010 0500   CREATININE 4.18* 12/25/2010 0500   CALCIUM 8.2* 12/25/2010 0500   GFRNONAA 12* 12/25/2010 0500   GFRAA 14* 12/25/2010 0500   ABG    Component Value Date/Time   PHART 7.492* 12/25/2010 0502   PCO2ART 33.7* 12/25/2010 0502   PO2ART 161.0* 12/25/2010 0502   HCO3 25.3* 12/25/2010 0502   TCO2 26.3 12/25/2010 0502   ACIDBASEDEF 3.2* 12/20/2010 0320   O2SAT 99.9 12/25/2010 0502    Lab 12/25/10 0500  MG 1.8   Lab Results  Component Value Date   CALCIUM 8.2* 12/25/2010   PHOS 2.1* 12/25/2010   Chest X-ray: ET tube in good position, improving pulmonary edema.  Assessment & Plan:  VDRF in setting of cardiac arrest.   - Maintain on PS as tolerated, full vent support at night then PS again in AM. - F/u CXR. - Bronchial hygiene. - Once dialysis is complete will extubate likely today or in AM with no intention to reintubate.  Purulent bronchitis with ?PNA.  - F/U on cultures. - D9/14 vancomycin and zosyn.  Shock:  Now resolved.  Actually hypertensive now. - Per family discussion no further escalation of care, CPR, cardioversion or pressors on 11/28. - Continue beta blockers PO today. - Increased nifedipine to 10 mg PO  daily.  Cardiac arrest with presumed VF with hx of systolic CHF and post-dialysis hypokalemia  - Rewarmed 11/26. - Continue beta blocker. - Continue norvac for BP control.  ESRD  - HD per renal, would benefit from more negative balance.  Anoxic encephalopathy: No sedation, following commands. - Monitor daily. - Neuro following.  Anemia  - F/U CBC. - Transfuse for Hb < 7.   Thrombocytopenia, resolved. - F/U CBC.   Hypoglycemia  - Monitor CBG's.  - Restart TF today.   Best practices / Disposition  - SCDs for DVT Px  - Protonix for GI Px   Attempted to reach patient's daughter today for plan of care, unable to reach by phone, message left will attempt again later.  The patient is critically ill with multiple organ systems failure and  requires high complexity decision making for assessment and support, frequent evaluation and titration of therapies, application of advanced monitoring technologies and extensive interpretation of multiple databases. Critical Care Time devoted to patient care services described in this note is 40 minutes.  Koren Bound, M.D.

## 2010-12-25 NOTE — Progress Notes (Signed)
Notified Elink of con't. Increase in temp. Requesting Tylenol order.  Placed ice packs to axilla and groin in interim.

## 2010-12-25 NOTE — Progress Notes (Signed)
Pt. Follows commands to squeeze hand/open fingers on L hand but not on R. Blinks to command, but does nothing else to command. Gaze appears focused on occasion, but nonfocused at other times.

## 2010-12-25 NOTE — Progress Notes (Signed)
Will follow some commands. Has strong cough and gag reflexes. On 30% FIO2 and tolerating vent well.

## 2010-12-25 NOTE — Progress Notes (Signed)
ANTIBIOTIC CONSULT NOTE - INITIAL  Pharmacy Consult for imipenem and vancomycin Indication: rule out sepsis  Allergies  Allergen Reactions  . Fortaz (Ceftazidime) Itching    Patient Measurements: Height: 5\' 11"  (180.3 cm) Weight: 151 lb 3.8 oz (68.6 kg) IBW/kg (Calculated) : 75.3  Adjusted Body Weight:   Vital Signs: Temp: 102.8 F (39.3 C) (12/06 1552) Temp src: Oral (12/06 1552) BP: 136/70 mmHg (12/06 1600) Pulse Rate: 104  (12/06 1121) Intake/Output from previous day: 12/05 0701 - 12/06 0700 In: 1975 [I.V.:900; NG/GT:1075] Out: -  Intake/Output from this shift: Total I/O In: 1370 [I.V.:160; Blood:700; NG/GT:460; IV Piggyback:50] Out: 3000 [Other:3000]  Labs:  [facility] 12/25/10 0500 12/24/10 0355 12/23/10 0425  WBC 5.7 6.5 10.2  HGB 7.3* 7.5* 8.0*  PLT 116* 114* 116*  LABCREA -- -- --  CREATININE 4.18* 2.77* 3.55*   Estimated Creatinine Clearance: 13.7 ml/min (by C-G formula based on Cr of 4.18). No results found for this basename: VANCOTROUGH:2,VANCOPEAK:2,VANCORANDOM:2,GENTTROUGH:2,GENTPEAK:2,GENTRANDOM:2,TOBRATROUGH:2,TOBRAPEAK:2,TOBRARND:2,AMIKACINPEAK:2,AMIKACINTROU:2,AMIKACIN:2, in the last 72 hours   Microbiology: Recent Results (from the past 720 hour(s))  MRSA PCR SCREENING     Status: Normal   Collection Time   11/23/2010  4:36 PM      Component Value Range Status Comment   MRSA by PCR NEGATIVE  NEGATIVE  Final   CULTURE, RESPIRATORY     Status: Normal   Collection Time   12/15/10 11:15 AM      Component Value Range Status Comment   Specimen Description ENDOTRACHEAL   Final    Special Requests NONE   Final    Gram Stain     Final    Value: ABUNDANT WBC PRESENT, PREDOMINANTLY PMN     NO SQUAMOUS EPITHELIAL CELLS SEEN     RARE GRAM POSITIVE COCCI IN PAIRS   Culture Non-Pathogenic Oropharyngeal-type Flora Isolated.   Final    Report Status 12/18/2010 FINAL   Final   CULTURE, BLOOD (ROUTINE X 2)     Status: Normal   Collection Time   12/16/10   1:00 PM      Component Value Range Status Comment   Specimen Description BLOOD HAND LEFT   Final    Special Requests BOTTLES DRAWN AEROBIC ONLY 3.0 CC    Final    Setup Time [id number]   Final    Culture NO GROWTH 5 DAYS   Final    Report Status 12/22/2010 FINAL   Final   CULTURE, BLOOD (ROUTINE X 2)     Status: Normal   Collection Time   12/16/10  1:10 PM      Component Value Range Status Comment   Specimen Description BLOOD HAND LEFT   Final    Special Requests BOTTLES DRAWN AEROBIC ONLY 2.0CC   Final    Setup Time [id number]   Final    Culture NO GROWTH 5 DAYS   Final    Report Status 12/22/2010 FINAL   Final     Medical History: Past Medical History  Diagnosis Date  . Chronic systolic heart failure 09/06/2009  . ESRD on hemodialysis     Tues, Thurs, Sat  . Cardiomyopathy   . HTN (hypertension)   . Hyperlipemia   . Anemia     Multifactorial - iron deficiency and anemia of chronic disease in setting of ESRD. Last anemia panel (07/2005 ) - Fe 16, TIBC  203, Ferritin 350.  Marland Kitchen Gout   . Tricuspid regurgitation   . Thrombus     H/o apical thrombus  . Cardiac  arrest 12/09/2007    with pulseless electrical activity 2/2 severe hyperkalemia (K > 7.5)  . Thyroid disease   . COPD (chronic obstructive pulmonary disease)   . GERD (gastroesophageal reflux disease)   . Cancer     prostate  . Rhabdomyolysis     H/O - secondary to blunt trauma to the buttock   . Secondary hyperparathyroidism   . 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 Chronic idiopathic thrombocytopenia     BL platelet 100-120.  Marland Kitchen Syncope     H/O multiple syncopal episodes thought to be vasovagal versus hypotension induced. Last episode  10/2009    Medications:  Scheduled:    . amLODipine  10 mg Per Tube QHS  . antiseptic oral rinse  1 application Mouth Rinse QID  . chlorhexidine  15 mL Mouth/Throat BID  . darbepoetin (ARANESP) injection - DIALYSIS  60 mcg Intravenous Q  Thu-HD  . feeding supplement  30 mL Per Tube BID  . imipenem-cilastatin  250 mg Intravenous Q12H  . levalbuterol  0.63 mg Nebulization QID  . magnesium sulfate IVPB  2 g Intravenous Once  . metoprolol tartrate  25 mg Oral Q6H  . pantoprazole sodium  40 mg Per Tube Q24H  . paricalcitol  2 mcg Intravenous Q T,Th,Sa-HD  . pneumococcal 23 valent vaccine  0.5 mL Intramuscular Tomorrow-1000  . potassium phosphate (monobasic)  1,000 mg Per Tube TID WC & HS  . vancomycin  750 mg Intravenous Q T,Th,Sa-HD  . DISCONTD: magnesium  2 g Intravenous Once  . DISCONTD: magnesium  2 g Intravenous Once  . DISCONTD: vancomycin  500 mg Intravenous Q12H  . DISCONTD: vancomycin  750 mg Intravenous Q T,Th,Sa-HD   Infusions:    . sodium chloride Stopped (12/25/10 0400)  . dextrose 20 mL/hr at 12/25/10 0800  . feeding supplement (NEPRO CARB STEADY) 1,000 mL (12/25/10 1133)  . propofol     Assessment: 77 yo male with r/o sepsis will be started back on antibiotic.  Patient's allergic to ceftaz Marchel Foote MD wants to do vanco and imipenem.  Had HD today  Goal of Therapy:  pre-HD vanco level 15-25  Plan:  1) Vancomycin 750mg  iv qHD (TThSa) 2) F/u schedule of HD 3) Imipenem 250mg  iv q12h  Kassiah Mccrory, Tsz-Yin 12/25/2010,4:26 PM

## 2010-12-25 NOTE — Plan of Care (Signed)
Problem: Consults Goal: Ventilated Patients Patient Education See Patient Education Module for education specifics. Outcome: Completed/Met Date Met:  12/25/10 Family educated when patient was initially intubated  Problem: Phase I Progression Outcomes Goal: VTE prophylaxis Outcome: Completed/Met Date Met:  12/25/10 Pt. Has been using SCDs since admission Goal: Code status addressed with pt/family Outcome: Completed/Met Date Met:  12/25/10 Pt. Is LCB.  Goal: Progressing towards optiumm acitivities Outcome: Not Progressing Pt. On vent Goal: Voiding-avoid urinary catheter unless indicated Outcome: Completed/Met Date Met:  12/25/10 Pt. anuric

## 2010-12-25 NOTE — Progress Notes (Signed)
eLink Physician-Brief Progress Note Patient Name: ANTHONEY SHEPPARD DOB: 1928/01/25 MRN: [id number]  Date of Service  12/25/2010   HPI/Events of Note  Having fever and recultured per RN but now having some loose stools   eICU Interventions  Check c diff pcr Empiric flagyl x 14 days; dc order if pcr negative in place   Intervention Category Major Interventions: Infection - evaluation and management  Antoin Dargis 12/25/2010, 9:43 PM

## 2010-12-25 NOTE — Progress Notes (Signed)
Clinical Social Worker reviewed chart and attempted contact with family, left voicemail messages with dtr at phone numbers listed in chart.  CSW unable to complete assessment at this time.  CSW to continue to follow and assist as needed.  Angelia Mould, MSW, [place] [phone]

## 2010-12-25 NOTE — Progress Notes (Signed)
CBG: 60  Treatment: D50 IV 25 mL  Symptoms: None  Follow-up CBG: Time:1230 CBG Result:85  Possible Reasons for Event: Unknown  Comments/MD notified: n/a    Jackson [name]

## 2010-12-25 NOTE — Progress Notes (Signed)
SUBJECTIVE: Pt intubated and sedated.   BP 137/79  Pulse 92  Temp(Src) 99.3 F (37.4 C) (Oral)  Resp 22  Ht 5\' 11"  (1.803 m)  Wt 157 lb 6.5 oz (71.4 kg)  BMI 21.95 kg/m2  SpO2 100%  Intake/Output Summary (Last 24 hours) at 12/25/10 0818 Last data filed at 12/25/10 0700  Gross per 24 hour  Intake   1880 ml  Output      0 ml  Net   1880 ml    PHYSICAL EXAM General: Intubated Lungs: Coarse BS bilaterally Heart: Irregular, no loud murmurs noted. Abdomen: Bowel sounds are present. Soft, non-tender.  Extremities: No lower extremity edema.   LABS: Basic Metabolic Panel:  Basename 12/25/10 0500 12/24/10 0355  NA 134* 133*  K 3.3* 3.3*  CL 98 99  CO2 26 28  GLUCOSE 93 95  BUN 40* 23  CREATININE 4.18* 2.77*  CALCIUM 8.2* 7.9*  MG 1.8 1.9  PHOS 2.1* 1.7*   CBC:  Basename 12/25/10 0500 12/24/10 0355  WBC 5.7 6.5  NEUTROABS -- --  HGB 7.3* 7.5*  HCT 22.4* 23.1*  MCV 88.9 89.5  PLT 116* 114*   Cardiac Enzymes: No results found for this basename: CKTOTAL:3,CKMB:3,CKMBINDEX:3,TROPONINI:3 in the last 72 hours Fasting Lipid Panel: No results found for this basename: CHOL,HDL,LDLCALC,TRIG,CHOLHDL,LDLDIRECT in the last 72 hours  Current Meds:    . amLODipine  10 mg Per Tube QHS  . antiseptic oral rinse  1 application Mouth Rinse QID  . chlorhexidine  15 mL Mouth/Throat BID  . darbepoetin      . darbepoetin (ARANESP) injection - DIALYSIS  60 mcg Intravenous Q Thu-HD  . feeding supplement  30 mL Per Tube BID  . levalbuterol  0.63 mg Nebulization QID  . metoprolol tartrate  25 mg Oral Q6H  . pantoprazole sodium  40 mg Per Tube Q24H  . paricalcitol      . paricalcitol  2 mcg Intravenous Q T,Th,Sa-HD  . potassium chloride  20 mEq Oral Once  . potassium phosphate (monobasic)  1,000 mg Per Tube TID WC & HS  . DISCONTD: piperacillin-tazobactam (ZOSYN)  IV  2.25 g Intravenous Q8H  . DISCONTD: vancomycin  750 mg Intravenous Q T,Th,Sa-HD     ASSESSMENT AND PLAN:  1.  VF arrest - defibrillated/resuscitated. Awaiting neuro recovery. No plans for further aggressive workup at this time.   2. Hypoxic encephalopathy- Slow improvement per records.   3. Acute on chronic systolic HF- volume management per hemodialysis.   4. Dispo - continue support. No escalation of care per family. Slow recovery.   We will follow with you. No aggressive measures at this time.       MCALHANY,CHRISTOPHER  12/6/20128:18 AM

## 2010-12-26 ENCOUNTER — Inpatient Hospital Stay (HOSPITAL_COMMUNITY): Payer: Medicare Other

## 2010-12-26 LAB — TYPE AND SCREEN: Unit division: 0

## 2010-12-26 LAB — BASIC METABOLIC PANEL
Calcium: 8 mg/dL — ABNORMAL LOW (ref 8.4–10.5)
Creatinine, Ser: 3.12 mg/dL — ABNORMAL HIGH (ref 0.50–1.35)
GFR calc non Af Amer: 17 mL/min — ABNORMAL LOW (ref >90)
Glucose, Bld: 91 mg/dL (ref 70–99)
Sodium: 135 mEq/L (ref 135–145)

## 2010-12-26 LAB — GLUCOSE, CAPILLARY
Glucose-Capillary: 96 mg/dL (ref 70–99)
Glucose-Capillary: 69 mg/dL — ABNORMAL LOW (ref 70–99)
Glucose-Capillary: 95 mg/dL (ref 70–99)
Glucose-Capillary: 90 mg/dL (ref 70–99)

## 2010-12-26 LAB — BLOOD GAS, ARTERIAL
Drawn by: 318431
MECHVT: 450 mL
PEEP: 5 cmH2O
Patient temperature: 98.6
RATE: 12 resp/min
pCO2 arterial: 37.2 mmHg (ref 35.0–45.0)
pH, Arterial: 7.462 — ABNORMAL HIGH (ref 7.350–7.450)

## 2010-12-26 LAB — POCT I-STAT 3, ART BLOOD GAS (G3+)
Acid-Base Excess: 6 mmol/L — ABNORMAL HIGH (ref 0.0–2.0)
O2 Saturation: 99 %
Patient temperature: 99.7
TCO2: 26 mmol/L (ref 0–100)
pH, Arterial: 7.664 (ref 7.350–7.450)

## 2010-12-26 LAB — MAGNESIUM: Magnesium: 2.1 mg/dL (ref 1.5–2.5)

## 2010-12-26 LAB — CBC
Hemoglobin: 9 g/dL — ABNORMAL LOW (ref 13.0–17.0)
MCH: 28.9 pg (ref 26.0–34.0)
MCHC: 33 g/dL (ref 30.0–36.0)
Platelets: 138 10*3/uL — ABNORMAL LOW (ref 150–400)

## 2010-12-26 LAB — CLOSTRIDIUM DIFFICILE BY PCR: Toxigenic C. Difficile by PCR: NEGATIVE

## 2010-12-26 MED ORDER — DEXTROSE 50 % IV SOLN
25.0000 mL | Freq: Once | INTRAVENOUS | Status: AC | PRN
  Administered 2010-12-26: 25 mL via INTRAVENOUS

## 2010-12-26 MED ORDER — DEXTROSE 50 % IV SOLN
INTRAVENOUS | Status: AC
  Administered 2010-12-26: 25 mL via INTRAVENOUS
  Filled 2010-12-26: qty 50

## 2010-12-26 MED ORDER — ACETAMINOPHEN 325 MG PO TABS
650.0000 mg | ORAL_TABLET | Freq: Four times a day (QID) | ORAL | Status: DC | PRN
  Administered 2011-01-01: 650 mg via ORAL
  Filled 2010-12-26 (×3): qty 2

## 2010-12-26 MED ORDER — TRAMADOL HCL 50 MG PO TABS
50.0000 mg | ORAL_TABLET | Freq: Four times a day (QID) | ORAL | Status: DC | PRN
  Administered 2010-12-26: 50 mg via ORAL
  Filled 2010-12-26: qty 1

## 2010-12-26 MED ORDER — POTASSIUM PHOSPHATE DIBASIC 3 MMOLE/ML IV SOLN
40.0000 meq | Freq: Once | INTRAVENOUS | Status: AC
  Administered 2010-12-26: 40 meq via INTRAVENOUS
  Filled 2010-12-26: qty 9.09

## 2010-12-26 MED ORDER — CAMPHOR-MENTHOL 0.5-0.5 % EX LOTN
TOPICAL_LOTION | CUTANEOUS | Status: DC | PRN
  Filled 2010-12-26: qty 222

## 2010-12-26 MED ORDER — HEPARIN SODIUM (PORCINE) 1000 UNIT/ML DIALYSIS
20.0000 [IU]/kg | INTRAMUSCULAR | Status: DC | PRN
  Administered 2010-12-27: 1400 [IU] via INTRAVENOUS_CENTRAL
  Filled 2010-12-26: qty 2

## 2010-12-26 NOTE — Progress Notes (Signed)
SUBJECTIVE: Pt intubated. Awake this am. Nods head yes to questions.   BP 103/66  Pulse 97  Temp(Src) 99.7 F (37.6 C) (Oral)  Resp 18  Ht 5\' 11"  (1.803 m)  Wt 154 lb 15.7 oz (70.3 kg)  BMI 21.62 kg/m2  SpO2 100%  Intake/Output Summary (Last 24 hours) at 12/26/10 0723 Last data filed at 12/26/10 0700  Gross per 24 hour  Intake 3225.87 ml  Output   3004 ml  Net 221.87 ml    PHYSICAL EXAM General: Intubated.  Lungs: Coarse BS bilaterally with no wheezes or rhonci noted.  Heart: Irregular  with no murmurs noted. Abdomen: Bowel sounds are present. Soft, non-tender.  Extremities: No lower extremity edema.   LABS: Basic Metabolic Panel:  Basename 12/26/10 0500 12/25/10 0500  NA 135 134*  K 3.5 3.3*  CL 100 98  CO2 26 26  GLUCOSE 91 93  BUN 30* 40*  CREATININE 3.12* 4.18*  CALCIUM 8.0* 8.2*  MG 2.1 1.8  PHOS 1.9* 2.1*   CBC:  Basename 12/26/10 0500 12/25/10 0500  WBC 5.4 5.7  NEUTROABS -- --  HGB 9.0* 7.3*  HCT 27.3* 22.4*  MCV 87.8 88.9  PLT 138* 116*    Current Meds:    . amLODipine  10 mg Per Tube QHS  . antiseptic oral rinse  1 application Mouth Rinse QID  . chlorhexidine  15 mL Mouth/Throat BID  . darbepoetin (ARANESP) injection - DIALYSIS  60 mcg Intravenous Q Thu-HD  . feeding supplement  30 mL Per Tube BID  . imipenem-cilastatin  250 mg Intravenous Q12H  . levalbuterol  0.63 mg Nebulization QID  . magnesium sulfate IVPB  2 g Intravenous Once  . metoprolol tartrate  25 mg Oral Q6H  . metroNIDAZOLE  500 mg Per Tube TID  . pantoprazole sodium  40 mg Per Tube Q24H  . paricalcitol  2 mcg Intravenous Q T,Th,Sa-HD  . pneumococcal 23 valent vaccine  0.5 mL Intramuscular Tomorrow-1000  . potassium phosphate (monobasic)  1,000 mg Per Tube TID WC & HS  . vancomycin  750 mg Intravenous Q T,Th,Sa-HD  . DISCONTD: magnesium  2 g Intravenous Once  . DISCONTD: magnesium  2 g Intravenous Once  . DISCONTD: metroNIDAZOLE  500 mg Oral Q8H  . DISCONTD: vancomycin   500 mg Intravenous Q12H  . DISCONTD: vancomycin  750 mg Intravenous Q T,Th,Sa-HD     ASSESSMENT AND PLAN:  1. VF arrest - defibrillated/resuscitated. Awaiting neuro recovery. No plans for further aggressive workup at this time.   2. Hypoxic encephalopathy- Pt awake this am. ? Minimal improvement.  3. Acute on chronic systolic HF- volume management per hemodialysis.   4. Dispo - Continue support. No escalation of care per family. If pt is extubated, he will not be re-intubated. DNR  We will sign off for now. Please call us with questions.      MCALHANY,CHRISTOPHER  12/7/20127:23 AM

## 2010-12-26 NOTE — Progress Notes (Signed)
Very weak effort w/I/S. Does not stay awake long enough to give good effort.  Attempted flutter valve w/poor effort results.

## 2010-12-26 NOTE — Progress Notes (Signed)
Patient becoming more agitated throughout night. After PRN Fentanyl doses pt does calm for 30-9m. Patient attempting to itch face/chest/groins and knock off vent tubing with mittens. Sitter at bedside for safety. At midnight with turning schedule attempting to roll patient to clean stool leaking around flexiseal, patient began hitting NT with mittens. Unable to completely clean patients sacrum area due to agitation and movement. Even after sitting patient back up in bed he continues to kick pillows off bed and attempt to dislodge tube/leads etc.  Elink MD called about situation. Orders for propofol gtt received, will start and continue to monitor. Normajean Baxter

## 2010-12-26 NOTE — Consults (Signed)
Assessment/Plan:  1. S/p cardiac arrest-VDRF Cardiomyopathy. no trach/peg planned, needs extubation ASAP per CCM 2. ESRD- usual days TTS via PC. HD Saturday  3. Anemia-,ABLA. transfused yesterday to optimize cardiopulmonary status   4. Secondary hyperparathyroidism- , phos is low will replace further  5. HTN/volume- stable  6. Neuro- appears more alert  7. Hypokalemia- running on high K bath, K replace  8 Resp Alkalosis, improving 9. Diarrhea 10 Fever, post hemodialysis w/u pending  S:on vent. Dialyzed yesterday with 3 liters removed O:BP 127/79  Pulse 97  Temp(Src) 99.7 F (37.6 C) (Oral)  Resp 33  Ht 5\' 11"  (1.803 m)  Wt 70.3 kg (154 lb 15.7 oz)  BMI 21.62 kg/m2  SpO2 100%  Intake/Output Summary (Last 24 hours) at 12/26/10 0829 Last data filed at 12/26/10 0700  Gross per 24 hour  Intake 3230.87 ml  Output   3004 ml  Net 226.87 ml   Weight change: -0.3 kg (-10.6 oz) ZOX:WRUE responsive today CVS:RRR Resp:few ronchi on left AVW:UJWJ XBJ:YNWGN index finer with edema Neuro follows commands, awake    . amLODipine  10 mg Per Tube QHS  . antiseptic oral rinse  1 application Mouth Rinse QID  . chlorhexidine  15 mL Mouth/Throat BID  . darbepoetin (ARANESP) injection - DIALYSIS  60 mcg Intravenous Q Thu-HD  . feeding supplement  30 mL Per Tube BID  . imipenem-cilastatin  250 mg Intravenous Q12H  . levalbuterol  0.63 mg Nebulization QID  . magnesium sulfate IVPB  2 g Intravenous Once  . metoprolol tartrate  25 mg Oral Q6H  . metroNIDAZOLE  500 mg Per Tube TID  . pantoprazole sodium  40 mg Per Tube Q24H  . paricalcitol  2 mcg Intravenous Q T,Th,Sa-HD  . pneumococcal 23 valent vaccine  0.5 mL Intramuscular Tomorrow-1000  . potassium phosphate (monobasic)  1,000 mg Per Tube TID WC & HS  . vancomycin  750 mg Intravenous Q T,Th,Sa-HD  . DISCONTD: magnesium  2 g Intravenous Once  . DISCONTD: magnesium  2 g Intravenous Once  . DISCONTD: metroNIDAZOLE  500 mg Oral Q8H  .  DISCONTD: vancomycin  500 mg Intravenous Q12H  . DISCONTD: vancomycin  750 mg Intravenous Q T,Th,Sa-HD   Dg Chest Port 1 View  12/25/2010  *RADIOLOGY REPORT*  Clinical Data: Endotracheal tube placement, shortness of breath  PORTABLE CHEST - 1 VIEW  Comparison: Portable chest x-ray of 12/24/2010  Findings: There has been an increase in opacity at the lung bases. This may reflect atelectasis and effusions, but pneumonia cannot be excluded.  Cardiomegaly is stable.  Endotracheal tube, left central venous line, and right central venous line are unchanged in position.  IMPRESSION: Increase in basilar opacities left greater than right.  Possible atelectasis and effusions but cannot exclude pneumonia.  Original Report Authenticated By: Juline Patch, M.D.   BMET  Lab 12/26/10 0500 12/25/10 0500 12/24/10 0355 12/23/10 0425 12/22/10 0300 12/21/10 0346 12/20/10 0500  NA 135 134* 133* 132* 130* 128* 126*  K 3.5 3.3* 3.3* 3.2* 3.1* 4.5 3.7  CL 100 98 99 97 96 96 93*  CO2 26 26 28 27 23 24 23   GLUCOSE 91 93 95 107* 118* 105* 113*  BUN 30* 40* 23 28* 29* 17 29*  CREATININE 3.12* 4.18* 2.77* 3.55* 3.88* 2.63* 3.94*  ALB -- -- -- -- -- -- --  CALCIUM 8.0* 8.2* 7.9* 8.0* 8.1* 7.5* 7.8*  PHOS 1.9* 2.1* 1.7* 2.0* 2.4 2.2* 3.0   CBC  Lab  12/26/10 0500 12/25/10 0500 12/24/10 0355 12/23/10 0425  WBC 5.4 5.7 6.5 10.2  NEUTROABS -- -- -- --  HGB 9.0* 7.3* 7.5* 8.0*  HCT 27.3* 22.4* 23.1* 23.2*  MCV 87.8 88.9 89.5 86.9  PLT 138* 116* 114* 116*   Jarius Dieudonne C

## 2010-12-26 NOTE — Progress Notes (Signed)
CBG: 69  Treatment: D50 IV 25 mL  Symptoms: None  Follow-up CBG: Time:1705 CBG Result:96  Possible Reasons for Event: Unknown  Comments/MD notified:na    Earl Lagos

## 2010-12-26 NOTE — Progress Notes (Signed)
While pt. Currently will nod head to some questions and will wiggle fingers and toes to command, I am not consistently able to elicit these responses. With eye opening, his gaze does not seem to be focuses although he nods "yes" when asked if he can see me. He nods "no" when asked if he wants to get the ETT out.

## 2010-12-26 NOTE — Progress Notes (Signed)
eLink Physician-Brief Progress Note Patient Name: Jackson [name] DOB: 1928-02-10 MRN: [id number]  Date of Service  12/26/2010   HPI/Events of Note  Agitation off cont sedation with order for small doses of fentanyl.  Also with itching.  Camera evaluation shows agitated patient with restraints/mittens moving in bed nonpurposefully   eICU Interventions  1. Instructed nurse to restart propofol and continue prn fentanyl 2. Order for Sarna lotion to affected areas.   Intervention Category Minor Interventions: Agitation / anxiety - evaluation and management;Other: (itching)  Lanette Ell 12/26/2010, 12:23 AM

## 2010-12-26 NOTE — Progress Notes (Signed)
eLink Physician-Brief Progress Note Patient Name: Jackson [name] DOB: 04-30-1928 MRN: [id number]  Date of Service  12/26/2010   HPI/Events of Note   Patient extubated today.  Now c/o of pain "all over".  HD stable.  eICU Interventions  Order for tylenol for mild pain and ultram prn for moderate pain.   Intervention Category Minor Interventions: Routine modifications to care plan (e.g. PRN medications for pain, fever)  DETERDING,ELIZABETH 12/26/2010, 10:28 PM

## 2010-12-26 NOTE — Progress Notes (Signed)
Subjective: Pt extubated at 0930 today. Doing well. Sleeping off and on since then. When awake, following basic commands. Able to follow 1 step commands for me during exam. Denying pain. Spontaneously moving all 4 extremities. Voice hoarse post-extubation.  Objective: Vital signs in last 24 hours: Filed Vitals:   12/26/10 0900 12/26/10 0930 12/26/10 1000 12/26/10 1100  BP: 107/65 125/82 135/72 129/70  Pulse:  99    Temp:    98.3 F (36.8 C)  TempSrc:    Oral  Resp: 29 21 31 27   Height:      Weight:      SpO2: 100% 100% 100% 100%   Weight change: -10.6 oz (-0.3 kg)  Intake/Output Summary (Last 24 hours) at 12/26/10 1134 Last data filed at 12/26/10 1100  Gross per 24 hour  Intake 2614.37 ml  Output      4 ml  Net 2610.37 ml   Physical Exam: Neurologic Examination:  Mental Status:  Patient does respond to verbal stimuli intermittently, however is unable to follow commands. He responds to his name but is unable to name himself or tell where or when he is. He is sleepy but arousable.   Cranial Nerves:  II: patient does respond to confrontation bilaterally, pupils right 2 mm, left 2 mm,and intact bilaterally  III,IV,VI: eyes follow me around the room but is not following my finger V,VII: no obvious droop to his face, he is not able to smile for me. Able to hold his eyes shut well VIII: patient does respond to verbal stimuli  IX,X: gag reflex present,  XI: trapezius strength unable to test bilaterally  XII: tongue strength unable to test  Motor:  Moves all 4 extremities spontaneously but is unwilling to follow commands. Follows some commands intermittently and is sleepy. Sensory:  Responds to painful stimuli, moves all 4 extremities spontaneously. Unable to assess sensation.  Deep Tendon Reflexes:  2+ throughout Plantars:  downgoing bilaterally  Cerebellar:  Right hand finger to nose is slightly shaky and un-coordinated. Pt not cooperating with left hand. No heel to shin done.    Lab Results: Basic Metabolic Panel:  Lab 12/26/10 4098 12/25/10 0500  NA 135 134*  K 3.5 3.3*  CL 100 98  CO2 26 26  GLUCOSE 91 93  BUN 30* 40*  CREATININE 3.12* 4.18*  CALCIUM 8.0* 8.2*  MG 2.1 1.8  PHOS 1.9* 2.1*  CBC:  Lab 12/26/10 0500 12/25/10 0500  WBC 5.4 5.7  NEUTROABS -- --  HGB 9.0* 7.3*  HCT 27.3* 22.4*  MCV 87.8 88.9  PLT 138* 116*   CBG:  Lab 12/26/10 0811 12/26/10 0403 12/25/10 2354 12/25/10 1934 12/25/10 1547 12/25/10 1227  GLUCAP 93 96 112* 89 91 85   Anemia Panel:  Lab 12/23/10 0900  VITAMINB12 --  FOLATE --  FERRITIN --  TIBC 91*  IRON 17*  RETICCTPCT --    Micro Results: Recent Results (from the past 240 hour(s))  CULTURE, BLOOD (ROUTINE X 2)     Status: Normal   Collection Time   12/16/10  1:00 PM      Component Value Range Status Comment   Specimen Description BLOOD HAND LEFT   Final    Special Requests BOTTLES DRAWN AEROBIC ONLY 3.0 CC    Final    Setup Time [id number]   Final    Culture NO GROWTH 5 DAYS   Final    Report Status 12/22/2010 FINAL   Final   CULTURE, BLOOD (ROUTINE X 2)  Status: Normal   Collection Time   12/16/10  1:10 PM      Component Value Range Status Comment   Specimen Description BLOOD HAND LEFT   Final    Special Requests BOTTLES DRAWN AEROBIC ONLY 2.0CC   Final    Setup Time [id number]   Final    Culture NO GROWTH 5 DAYS   Final    Report Status 12/22/2010 FINAL   Final   CULTURE, RESPIRATORY     Status: Normal (Preliminary result)   Collection Time   12/25/10  5:35 PM      Component Value Range Status Comment   Specimen Description TRACHEAL ASPIRATE   Final    Special Requests NONE   Final    Gram Stain     Final    Value: RARE WBC PRESENT, PREDOMINANTLY PMN     NO SQUAMOUS EPITHELIAL CELLS SEEN     NO ORGANISMS SEEN   Culture NO GROWTH   Final    Report Status PENDING   Incomplete   CLOSTRIDIUM DIFFICILE BY PCR     Status: Normal   Collection Time   12/25/10  9:54 PM      Component Value  Range Status Comment   C difficile by pcr NEGATIVE  NEGATIVE  Final    Studies/Results: Dg Chest Port 1 View  12/25/2010  *RADIOLOGY REPORT*  Clinical Data: Endotracheal tube placement, shortness of breath  PORTABLE CHEST - 1 VIEW  Comparison: Portable chest x-ray of 12/24/2010  Findings: There has been an increase in opacity at the lung bases. This may reflect atelectasis and effusions, but pneumonia cannot be excluded.  Cardiomegaly is stable.  Endotracheal tube, left central venous line, and right central venous line are unchanged in position.  IMPRESSION: Increase in basilar opacities left greater than right.  Possible atelectasis and effusions but cannot exclude pneumonia.  Original Report Authenticated By: Juline Patch, M.D.   Medications: I have reviewed the patient's current medications. Scheduled Meds:   . amLODipine  10 mg Per Tube QHS  . antiseptic oral rinse  1 application Mouth Rinse QID  . chlorhexidine  15 mL Mouth/Throat BID  . darbepoetin (ARANESP) injection - DIALYSIS  60 mcg Intravenous Q Thu-HD  . imipenem-cilastatin  250 mg Intravenous Q12H  . levalbuterol  0.63 mg Nebulization QID  . magnesium sulfate IVPB  2 g Intravenous Once  . metoprolol tartrate  25 mg Oral Q6H  . pantoprazole sodium  40 mg Per Tube Q24H  . paricalcitol  2 mcg Intravenous Q T,Th,Sa-HD  . pneumococcal 23 valent vaccine  0.5 mL Intramuscular Tomorrow-1000  . potassium phosphate (monobasic)  1,000 mg Per Tube TID WC & HS  . potassium phosphate IVPB (mEq)  40 mEq Intravenous Once  . vancomycin  750 mg Intravenous Q T,Th,Sa-HD  . DISCONTD: feeding supplement  30 mL Per Tube BID  . DISCONTD: metroNIDAZOLE  500 mg Per Tube TID  . DISCONTD: metroNIDAZOLE  500 mg Oral Q8H  . DISCONTD: vancomycin  500 mg Intravenous Q12H  . DISCONTD: vancomycin  750 mg Intravenous Q T,Th,Sa-HD   Continuous Infusions:   . sodium chloride Stopped (12/26/10 0939)  . dextrose Stopped (12/26/10 0951)  . DISCONTD:  feeding supplement (NEPRO CARB STEADY) Stopped (12/26/10 0939)  . DISCONTD: propofol Stopped (12/26/10 0735)   PRN Meds:.camphor-menthol, dextrose, heparin, heparin, heparin, hydrALAZINE, levalbuterol, DISCONTD: fentaNYL Assessment/Plan: Active Problems: 1.  Cardiopulmonary arrest - status post cooling and re-warming. Was extubated at 0930 this morning and  doing well. Will continue to follow for improvement. Appears to be moving all four extremities at this time and following basic commands. May need MRI once stable. No change to management today.   2. ESRD (end stage renal disease) on dialysis- getting HD currently Other medical problems:  Hypokalemia  Anemia  Thrombocytopenia  Hypertension  Hypoglycemia   LOS: 13 days   KOLLAR, ELIZABETH 12/26/2010, 11:34 AM

## 2010-12-26 NOTE — Progress Notes (Signed)
eLink Physician-Brief Progress Note Patient Name: Jackson [name] DOB: [birth year] MRN: [id number]  Date of Service  12/26/2010   HPI/Events of Note  ABG shows resp alkalosis with pH of 6.6.  Not breathing over the vent at rate of 12  eICU Interventions  TV on vent decreased to 450 cc with plan to recheck ABG in 1 hour post vent change.   Intervention Category Intermediate Interventions:  (acid/base)  DETERDING,ELIZABETH 12/26/2010, 4:16 AM

## 2010-12-26 NOTE — Plan of Care (Signed)
Problem: Phase II Progression Outcomes Goal: Date pt extubated/weaned off vent Outcome: Completed/Met Date Met:  12/26/10 Extubated at 0930 Goal: Time pt extubated/weaned off vent Outcome: Completed/Met Date Met:  12/26/10 0930 Goal: Progress activities as ordered Outcome: Progressing Pt. W/bed in chair position. Needs PT eval - cannot bear weight on legs to pivot to chair. Goal: Code status re-addressed Outcome: Completed/Met Date Met:  12/26/10 Pt. Full DNR

## 2010-12-26 NOTE — Progress Notes (Signed)
Attempted to get pt. Into recliner. He is able to maintain seated position on side of bed, but unable to bear weight on legs when attempting to stand. He could not remain upright to pivot to chair w/2 assists.  Placed back in bed and put in chair position

## 2010-12-26 NOTE — Progress Notes (Signed)
Propofol turned off.  Pt. Will follow some commands, but not consistently. Strong cough and gag reflex intact. Breathing over vent rate. Occasionally restless w/out sedation

## 2010-12-26 NOTE — Progress Notes (Signed)
HPI:  Jackson [name] is a 77 y.o. male admitted on 12/12/2010 with cardiac arrest after HD. Had shockable rhythm per AED applied by paramedics.  PMHx Cardiomyopathy with EF 30%, ESRD on HD T/Th/Sa, HTN, Hyperlipidemia, Anemia, Gout, TR  Antibiotics:   Zosyn 11/26>> Vancomycin 11/26>>  Cultures/Sepsis Markers:   Sputum 11/26>>>NTD Blood 11/27>>>NTD C. Diff 12/6>>>negative  Access/Protocols:  11/24 R tunneled HD catheter (preadmission)  11/24 ETT>>>12/7 11/24 OGT>>> 12/7 11/24 L [place] TLC>>>  11/25 Lt Radial [place]>>>12/1  Best Practice: DVT: Hep SQ GI: Protonix  11/24 Cardiac arrest post HD, ACLS, hypothermia protocol started and complete.  Subjective: Rewarmed, no events overnight.    Physical Exam: Filed Vitals:   12/26/10 0800  BP: 127/79  Pulse:   Temp:   Resp: 33    Intake/Output Summary (Last 24 hours) at 12/26/10 0928 Last data filed at 12/26/10 0800  Gross per 24 hour  Intake 2840.87 ml  Output   3004 ml  Net -163.13 ml   Vent Mode:  [-] CPAP FiO2 (%):  [29.7 %-30.5 %] 30.1 % Set Rate:  [12 bmp] 12 bmp Vt Set:  [450 mL-500 mL] 450 mL PEEP:  [5 cmH20] 5 cmH20 Pressure Support:  [5 cmH20] 5 cmH20 Plateau Pressure:  [7 cmH20-28 cmH20] 28 cmH20  Neuro: Eyes closed, but patient is following commands. Cardiac: RRR, Nl S1/S2, -M/R/G. Pulmonary: Coarse BS diffusely. GI: Soft, NT, ND and +BS. Extremities: 2+ edema and -tenderness.  Labs: CBC    Component Value Date/Time   WBC 5.4 12/26/2010 0500   RBC 3.11* 12/26/2010 0500   HGB 9.0* 12/26/2010 0500   HCT 27.3* 12/26/2010 0500   PLT 138* 12/26/2010 0500   MCV 87.8 12/26/2010 0500   MCH 28.9 12/26/2010 0500   MCHC 33.0 12/26/2010 0500   RDW 17.7* 12/26/2010 0500   LYMPHSABS 3.4 12/03/2010 1257   MONOABS 0.4 12/07/2010 1257   EOSABS 0.3 11/23/2010 1257   BASOSABS 0.1 11/24/2010 1257   BMET    Component Value Date/Time   NA 135 12/26/2010 0500   K 3.5 12/26/2010 0500   CL 100 12/26/2010 0500   CO2 26  12/26/2010 0500   GLUCOSE 91 12/26/2010 0500   BUN 30* 12/26/2010 0500   CREATININE 3.12* 12/26/2010 0500   CALCIUM 8.0* 12/26/2010 0500   GFRNONAA 17* 12/26/2010 0500   GFRAA 20* 12/26/2010 0500   ABG    Component Value Date/Time   PHART 7.462* 12/26/2010 0643   PCO2ART 37.2 12/26/2010 0643   PO2ART 57.9* 12/26/2010 0643   HCO3 26.3* 12/26/2010 0643   TCO2 27.4 12/26/2010 0643   ACIDBASEDEF 3.2* 12/20/2010 0320   O2SAT 92.0 12/26/2010 0643    Lab 12/26/10 0500  MG 2.1   Lab Results  Component Value Date   CALCIUM 8.0* 12/26/2010   PHOS 1.9* 12/26/2010   Chest X-ray: ET tube in good position, improving pulmonary edema.  Assessment & Plan:  VDRF in setting of cardiac arrest.   - Extubate patient. - F/u CXR. - Bronchial hygiene.  Purulent bronchitis with ?PNA.  - D10/14 vancomycin and zosyn. - C diff negative, d/c flagyl.  Shock:  Now resolved.  Actually hypertensive now. - Per family discussion no further escalation of care, CPR, cardioversion or pressors on 11/28. - Continue beta blockers PO today. - Increased nifedipine to 10 mg PO daily.  Cardiac arrest with presumed VF with hx of systolic CHF and post-dialysis hypokalemia  - Rewarmed 11/26. - Continue beta blocker. - Continue norvac for  BP control.  ESRD  - HD per renal, would benefit from more negative balance.  Anoxic encephalopathy: No sedation, following commands. - Monitor daily. - Neuro following.  Anemia  - F/U CBC. - Transfuse for Hb < 7.   Best practices / Disposition  - SCDs for DVT Px  - Protonix for GI Px   No need for swallow evaluation, patient is not to be reintubated regardless so will allow to have clear liquid and advance for comfort as tolerated. PT/OT evaluation and progress now that he is off vent.  Extubate patient today, no intention to reintubate.  Will make a full NCB.  The patient is critically ill with multiple organ systems failure and requires high complexity decision making for  assessment and support, frequent evaluation and titration of therapies, application of advanced monitoring technologies and extensive interpretation of multiple databases. Critical Care Time devoted to patient care services described in this note is 40 minutes.  Koren Bound, M.D. [phone]

## 2010-12-27 ENCOUNTER — Inpatient Hospital Stay (HOSPITAL_COMMUNITY): Payer: Medicare Other

## 2010-12-27 LAB — BASIC METABOLIC PANEL
BUN: 39 mg/dL — ABNORMAL HIGH (ref 6–23)
GFR calc non Af Amer: 12 mL/min — ABNORMAL LOW (ref >90)
Glucose, Bld: 87 mg/dL (ref 70–99)
Potassium: 4.1 mEq/L (ref 3.5–5.1)

## 2010-12-27 LAB — CBC
HCT: 29.6 % — ABNORMAL LOW (ref 39.0–52.0)
Hemoglobin: 9.7 g/dL — ABNORMAL LOW (ref 13.0–17.0)
MCHC: 32.8 g/dL (ref 30.0–36.0)

## 2010-12-27 LAB — GLUCOSE, CAPILLARY
Glucose-Capillary: 73 mg/dL (ref 70–99)
Glucose-Capillary: 100 mg/dL — ABNORMAL HIGH (ref 70–99)
Glucose-Capillary: 108 mg/dL — ABNORMAL HIGH (ref 70–99)
Glucose-Capillary: 63 mg/dL — ABNORMAL LOW (ref 70–99)
Glucose-Capillary: 98 mg/dL (ref 70–99)

## 2010-12-27 MED ORDER — DEXTROSE 50 % IV SOLN
INTRAVENOUS | Status: AC
  Administered 2010-12-27: 25 mL
  Filled 2010-12-27: qty 50

## 2010-12-27 MED ORDER — DEXTROSE 50 % IV SOLN
INTRAVENOUS | Status: AC
  Administered 2010-12-27: 25 mL via INTRAVENOUS
  Filled 2010-12-27: qty 50

## 2010-12-27 MED ORDER — DEXTROSE 50 % IV SOLN
25.0000 mL | Freq: Once | INTRAVENOUS | Status: AC | PRN
  Administered 2010-12-27: 25 mL via INTRAVENOUS

## 2010-12-27 MED ORDER — DEXTROSE 50 % IV SOLN
25.0000 mL | Freq: Once | INTRAVENOUS | Status: AC | PRN
  Administered 2010-12-27 (×2): 25 mL via INTRAVENOUS

## 2010-12-27 MED ORDER — PARICALCITOL 5 MCG/ML IV SOLN
INTRAVENOUS | Status: AC
  Administered 2010-12-27: 2 ug via INTRAVENOUS
  Filled 2010-12-27: qty 1

## 2010-12-27 NOTE — Progress Notes (Addendum)
HPI:  Jackson [name] is a 77 y.o. male admitted on 12/10/2010 with cardiac arrest after HD. Had shockable rhythm per AED applied by paramedics.  PMHx Cardiomyopathy with EF 30%, ESRD on HD T/Th/Sa, HTN, Hyperlipidemia, Anemia, Gout, TR  Antibiotics:   Zosyn 11/26>>12/5 Vancomycin 11/26>> Primaxin 12/6>>>  Cultures/Sepsis Markers:   Sputum 11/26>>>Neg Blood 11/27>>>Neg C. Diff 12/6>>>negative Sputum 12/6>>  Access/Protocols:  11/24 R tunneled HD catheter (preadmission) >> 11/24 ETT>>>12/7 11/24 OGT>>> 12/7 11/24 L [place] TLC>>>  11/25 Lt Radial Aline>>>12/1  Best Practice: DVT: Hep SQ GI: Protonix  Key Events / Studies 11/24 Cardiac arrest post HD, ACLS, hypothermia protocol started and complete. 12/7 Extubated with no plan for reintubation  Subjective: RN reports weak cough but overall better mental status  Physical Exam: Filed Vitals:   12/27/10 0815 12/27/10 0830 12/27/10 0845 12/27/10 0900  BP: 126/70 134/71 139/64 131/75  Pulse: 80 86 88 88  Temp:    98.1 F (36.7 C)  TempSrc:    Oral  Resp: 27 25 28 24   Height:      Weight:      SpO2: 100% 100% 100% 99%     Intake/Output Summary (Last 24 hours) at 12/27/10 0742 Last data filed at 12/27/10 0600  Gross per 24 hour  Intake 1917.5 ml  Output    750 ml  Net 1167.5 ml   EXAM: Neuro: Awake, alert, MAE but weak, weak voice, simple answers appropriate Cardiac: RRR, Nl S1/S2, -M/R/G. Pulmonary: Coarse BS diffusely. GI: Soft, NT, ND and +BS. Extremities: 2+ edema and -tenderness.  Labs: CBC    Component Value Date/Time   WBC 6.0 12/27/2010 0430   RBC 3.40* 12/27/2010 0430   HGB 9.7* 12/27/2010 0430   HCT 29.6* 12/27/2010 0430   PLT 152 12/27/2010 0430   MCV 87.1 12/27/2010 0430   MCH 28.5 12/27/2010 0430   MCHC 32.8 12/27/2010 0430   RDW 17.5* 12/27/2010 0430   LYMPHSABS 3.4 11/28/2010 1257   MONOABS 0.4 11/25/2010 1257   EOSABS 0.3 11/24/2010 1257   BASOSABS 0.1 12/16/2010 1257   BMET    Component Value  Date/Time   NA 130* 12/27/2010 0430   K 4.1 12/27/2010 0430   CL 94* 12/27/2010 0430   CO2 25 12/27/2010 0430   GLUCOSE 87 12/27/2010 0430   BUN 39* 12/27/2010 0430   CREATININE 4.32* 12/27/2010 0430   CALCIUM 8.0* 12/27/2010 0430   GFRNONAA 12* 12/27/2010 0430   GFRAA 14* 12/27/2010 0430    Lab Results  Component Value Date   CALCIUM 8.0* 12/27/2010   PHOS 5.3* 12/27/2010  MAG 2.1 12/8  Chest X-ray: no new films 12/8  Assessment & Plan:  VDRF in setting of cardiac arrest.   Extubated 12/7 with no plan for reintubation.  - F/u CXR. - Bronchial hygiene, add flutter  Purulent bronchitis with ?PNA.  - Abx as above - C diff negative  Shock:  Now resolved.  Actually hypertensive now. - Per family discussion no further escalation of care, CPR, cardioversion or pressors on 11/28. - Continue beta blockers PO  - Norvasc 10mg   Cardiac arrest with presumed VF with hx of systolic CHF and post-dialysis hypokalemia  - Rewarmed 11/26. - Continue beta blocker. - Continue norvasc for BP control.  ESRD  - HD per renal, would benefit from more negative balance.  Anoxic encephalopathy: Resolving - Monitor daily. - Neuro following.  Anemia  - F/U CBC. - Transfuse for Hb < 7.   Best practices /  Disposition  - SCDs for DVT Px  - Protonix for GI Px  -CODE STATUS:  DNR  No need for swallow evaluation, patient is not to be reintubated regardless so will allow to have clear liquid and advance for comfort as tolerated. PT/OT evaluation and progress now that he is off vent.     Canary Brim, NP-C [facility] Pgr: [phone]  Reviewed above and examined.  He is DNR/DNI.  Will transfer to SDU.  Will transfer to [facility] Team 8 from 12/09 and PCCM will sign off.  Rodina Pinales 10:34 AM, 12/27/2010 Pager:  [phone]

## 2010-12-27 NOTE — Progress Notes (Signed)
CBG: 67  Treatment: D50 IV 25 mL  Symptoms: None  Follow-up CBG: Time:03:42 CBG Result:107  Possible Reasons for Event: Inadequate meal intake  Comments/MD notified:Yes. Orders received to increase IV fluids (D10W) to 23ml/hr.   Graylin Sperling, Bearl Mulberry

## 2010-12-27 NOTE — Consults (Signed)
Assessment/Plan:  1. S/p cardiac arrest-VDRF & Cardiomyopathy s/p extubation  2. ESRD- usual days TTS via PC. HD in progress 3. Anemia-ABLA transfused  4. HTN/volume- stable  5. Neuro- alert post arrest 6. Hypokalemia-replaced  7. Hypophosphatemia-replaced  S:Coughing to clear secretions O:BP 134/71  Pulse 86  Temp(Src) 97.5 F (36.4 C) (Oral)  Resp 25  Ht 5\' 11"  (1.803 m)  Wt 71.7 kg (158 lb 1.1 oz)  BMI 22.05 kg/m2  SpO2 100%  Intake/Output Summary (Last 24 hours) at 12/27/10 0850 Last data filed at 12/27/10 0600  Gross per 24 hour  Intake 1842.5 ml  Output    750 ml  Net 1092.5 ml   Weight change: -0.9 kg (-1 lb 15.8 oz) ZOX:WRUEA and coughing CVS:RRR Resp:rhonchi VWU:JWJX Ext:no edema    . amLODipine  10 mg Per Tube QHS  . antiseptic oral rinse  1 application Mouth Rinse QID  . chlorhexidine  15 mL Mouth/Throat BID  . darbepoetin (ARANESP) injection - DIALYSIS  60 mcg Intravenous Q Thu-HD  . dextrose      . imipenem-cilastatin  250 mg Intravenous Q12H  . levalbuterol  0.63 mg Nebulization QID  . metoprolol tartrate  25 mg Oral Q6H  . pantoprazole sodium  40 mg Per Tube Q24H  . paricalcitol      . paricalcitol  2 mcg Intravenous Q T,Th,Sa-HD  . pneumococcal 23 valent vaccine  0.5 mL Intramuscular Tomorrow-1000  . potassium phosphate (monobasic)  1,000 mg Per Tube TID WC & HS  . potassium phosphate IVPB (mEq)  40 mEq Intravenous Once  . vancomycin  750 mg Intravenous Q T,Th,Sa-HD  . DISCONTD: feeding supplement  30 mL Per Tube BID  . DISCONTD: metroNIDAZOLE  500 mg Per Tube TID   No results found. BMET  Lab 12/27/10 0430 12/26/10 0500 12/25/10 0500 12/24/10 0355 12/23/10 0425 12/22/10 0300 12/21/10 0346  NA 130* 135 134* 133* 132* 130* 128*  K 4.1 3.5 3.3* 3.3* 3.2* 3.1* 4.5  CL 94* 100 98 99 97 96 96  CO2 25 26 26 28 27 23 24   GLUCOSE 87 91 93 95 107* 118* 105*  BUN 39* 30* 40* 23 28* 29* 17  CREATININE 4.32* 3.12* 4.18* 2.77* 3.55* 3.88* 2.63*  ALB  -- -- -- -- -- -- --  CALCIUM 8.0* 8.0* 8.2* 7.9* 8.0* 8.1* 7.5*  PHOS 5.3* 1.9* 2.1* 1.7* 2.0* 2.4 2.2*   CBC  Lab 12/27/10 0430 12/26/10 0500 12/25/10 0500 12/24/10 0355  WBC 6.0 5.4 5.7 6.5  NEUTROABS -- -- -- --  HGB 9.7* 9.0* 7.3* 7.5*  HCT 29.6* 27.3* 22.4* 23.1*  MCV 87.1 87.8 88.9 89.5  PLT 152 138* 116* 114*   Lorin Gawron C

## 2010-12-27 NOTE — Progress Notes (Signed)
eLink Physician-Brief Progress Note Patient Name: Jackson [name] DOB: 1928/11/21 MRN: [id number]  Date of Service  12/27/2010   HPI/Events of Note   Continued episodes of hypoglycemia.  Patient taking pos poorly.  Currently on D10 infusion at 20 ml/hr  eICU Interventions  Plan: Increase D10 to 40 cc/hr   Intervention Category Intermediate Interventions:  (hypoglycemia)  Dominico Rod 12/27/2010, 3:37 AM

## 2010-12-27 NOTE — Progress Notes (Signed)
CBG: 63  Treatment: D50 IV 25 mL  Symptoms: None  Follow-up CBG: Time:1610 CBG Result:108  Possible Reasons for Event: Unknown  Comments/MD notified:n/a    Earl Lagos

## 2010-12-27 NOTE — Progress Notes (Signed)
PT Cancellation Note PT order received and chart reviewed.  Patient received hemodialysis today.  Patient very lethargic and with labored breathing (resp rate 36).  Spoke with RN - reports patient is typically lethargic following HD.  Attempted to have patient sit on EOB.  Patient unable to follow commands or participate with therapy.  Will return in am to attempt PT eval. Durenda Hurt. Renaldo Fiddler, [facility] Acute Rehab Services Pager [phone]

## 2010-12-27 NOTE — Progress Notes (Signed)
CBG: 69  Treatment: D50 IV 25 mL  Symptoms: None  Follow-up CBG: Time:1210 CBG Result:100  Possible Reasons for Event: Unknown  Comments/MD notified:na    Earl Lagos

## 2010-12-27 NOTE — Consult Note (Signed)
Subjective: Saw patient after dialysis. He seemed lethargic and slow to answer questions.   Objective: Vital signs in last 24 hours: Temp:  [97.5 F (36.4 C)-99.5 F (37.5 C)] 98.2 F (36.8 C) (12/08 1146) Pulse Rate:  [78-98] 96  (12/08 1025) Resp:  [19-30] 29  (12/08 1100) BP: (81-166)/(12-91) 166/91 mmHg (12/08 1100) SpO2:  [80 %-100 %] 96 % (12/08 1100) FiO2 (%):  [2 %] 2 % (12/08 0600) Weight:  [69.1 kg (152 lb 5.4 oz)-71.7 kg (158 lb 1.1 oz)] 152 lb 5.4 oz (69.1 kg) (12/08 1025)  Intake/Output from previous day: 12/07 0701 - 12/08 0700 In: 2057.5 [P.O.:764; I.V.:510; NG/GT:52.5; IV Piggyback:731] Out: 750 [Stool:750] Intake/Output this shift: Total I/O In: 360 [P.O.:50; I.V.:160; IV Piggyback:150] Out: 3000 [Other:3000] Nutritional status: Clear Liquid  Neurological exam: patient lethargic, but arousable by sternal rub, attended to examiner, but his attempts at speech did not make sense, followed one simple command to squeeze hand on left side, but not on right side, very inattentive, blink to threat positive bilaterally, PERRL, EOMI, no face asymmetry, withdrew all 4 extremities to pain symmetrically  Lab Results:  Basename 12/27/10 0430 12/26/10 0500  WBC 6.0 5.4  HGB 9.7* 9.0*  HCT 29.6* 27.3*  PLT 152 138*  NA 130* 135  K 4.1 3.5  CL 94* 100  CO2 25 26  GLUCOSE 87 91  BUN 39* 30*  CREATININE 4.32* 3.12*  CALCIUM 8.0* 8.0*  LABA1C -- --   Medications: I have reviewed the patient's current medications.  Assessment/Plan: 77 years old man with anoxic brain injury - we are following for repeat neurological exams. Today patient appears to be more lethargic than usual. Per nurse, he always is this way after just having gotten his dialysis - no focality on exam. Will follow up tomorrow and re-examine.   LOS: 14 days   Adelynn Gipe

## 2010-12-27 NOTE — Progress Notes (Signed)
CBG:67   Treatment: D50 IV 25 mL   Symptoms: None   Follow-up CBG: Time:23:56 CBG Result: 112 Possible Reasons for Event: Inadequate meal intake   Comments/MD notified:na   Masen Salvas, Bearl Mulberry

## 2010-12-27 NOTE — Plan of Care (Signed)
Problem: Phase III Progression Outcomes Goal: Barriers To Progression Addressed/Resolved Outcome: Not Progressing Pt. Is exceptionally weak, especially after hemodialysis.  He is unable to assist in moving himself and requires total care. He remains rhonchorus w/very weak cough effort and requires NT suctioning. Diet intake is poor and has not progressed beyond clear liquids. Likely to need SNF placement upon discharge.

## 2010-12-28 ENCOUNTER — Inpatient Hospital Stay (HOSPITAL_COMMUNITY): Payer: Medicare Other

## 2010-12-28 LAB — CULTURE, RESPIRATORY W GRAM STAIN: Culture: NO GROWTH

## 2010-12-28 LAB — BASIC METABOLIC PANEL
BUN: 19 mg/dL (ref 6–23)
Chloride: 99 mEq/L (ref 96–112)
Creatinine, Ser: 3.41 mg/dL — ABNORMAL HIGH (ref 0.50–1.35)
GFR calc Af Amer: 18 mL/min — ABNORMAL LOW (ref >90)
GFR calc non Af Amer: 16 mL/min — ABNORMAL LOW (ref >90)
Potassium: 4.2 mEq/L (ref 3.5–5.1)

## 2010-12-28 LAB — MAGNESIUM: Magnesium: 2 mg/dL (ref 1.5–2.5)

## 2010-12-28 LAB — CBC
MCHC: 32.9 g/dL (ref 30.0–36.0)
RDW: 17.2 % — ABNORMAL HIGH (ref 11.5–15.5)
WBC: 5.2 10*3/uL (ref 4.0–10.5)

## 2010-12-28 LAB — PHOSPHORUS: Phosphorus: 3.5 mg/dL (ref 2.3–4.6)

## 2010-12-28 LAB — GLUCOSE, CAPILLARY: Glucose-Capillary: 83 mg/dL (ref 70–99)

## 2010-12-28 NOTE — Progress Notes (Signed)
Pt had a large blister on the tip of his right middle finger that he managed to pop and then essentially deglove the finger tip. I placed a sterile vasoline gauze and 4x4 dressing over it. MD aware--waiting for surgery to consult. Daughter at bedside.

## 2010-12-28 NOTE — Consult Note (Signed)
  Patient seen at bedside today  nailplate and volar skin debrided at bedside  Nailbed intact  Probable trauma with subungual hematoma   Would start daily xeroform dressing changes and followup in my office after discharge if needed   Will check xray

## 2010-12-28 NOTE — Consults (Signed)
Assessment/Plan:  1. S/p cardiac arrest-resp failure with cardiomyopathy - off vent, stable resp. Not to be reintubated. DNR.  2. ESRD / TTS via PC-  Blood pressure up, 3 kg off with HD yesterday.  Increase UF next HD 4-5 kg.   3. Anemia sec CKD/ABLA - transfused 4. Neuro- mild confusion, s/p arrest 5. Hypokalemia-   resolved 6. Hypophosphatemia-  resolved  S:Coughing to clear secretions O:BP 108/87  Pulse 84  Temp(Src) 98.1 F (36.7 C) (Oral)  Resp 19  Ht 5\' 11"  (1.803 m)  Wt 68.7 kg (151 lb 7.3 oz)  BMI 21.12 kg/m2  SpO2 100%  Intake/Output Summary (Last 24 hours) at 12/28/10 0938 Last data filed at 12/28/10 0844  Gross per 24 hour  Intake   1448 ml  Output   3000 ml  Net  -1552 ml   Weight change: -1.4 kg (-3 lb 1.4 oz) BJY:NWGNF and coughing, chest congestion CVS:RRR Resp:rhonchi AOZ:HYQM Ext:no edema, R chest catheter    . amLODipine  10 mg Per Tube QHS  . antiseptic oral rinse  1 application Mouth Rinse QID  . chlorhexidine  15 mL Mouth/Throat BID  . darbepoetin (ARANESP) injection - DIALYSIS  60 mcg Intravenous Q Thu-HD  . dextrose      . imipenem-cilastatin  250 mg Intravenous Q12H  . levalbuterol  0.63 mg Nebulization QID  . metoprolol tartrate  25 mg Oral Q6H  . pantoprazole sodium  40 mg Per Tube Q24H  . paricalcitol  2 mcg Intravenous Q T,Th,Sa-HD  . potassium phosphate (monobasic)  1,000 mg Per Tube TID WC & HS  . vancomycin  750 mg Intravenous Q T,Th,Sa-HD   No results found. BMET  Lab 12/28/10 0455 12/27/10 0430 12/26/10 0500 12/25/10 0500 12/24/10 0355 12/23/10 0425 12/22/10 0300  NA 134* 130* 135 134* 133* 132* 130*  K 4.2 4.1 3.5 3.3* 3.3* 3.2* 3.1*  CL 99 94* 100 98 99 97 96  CO2 27 25 26 26 28 27 23   GLUCOSE 87 87 91 93 95 107* 118*  BUN 19 39* 30* 40* 23 28* 29*  CREATININE 3.41* 4.32* 3.12* 4.18* 2.77* 3.55* 3.88*  ALB -- -- -- -- -- -- --  CALCIUM 8.2* 8.0* 8.0* 8.2* 7.9* 8.0* 8.1*  PHOS 3.5 5.3* 1.9* 2.1* 1.7* 2.0* 2.4   CBC  Lab  12/28/10 0455 12/27/10 0430 12/26/10 0500 12/25/10 0500  WBC 5.2 6.0 5.4 5.7  NEUTROABS -- -- -- --  HGB 9.5* 9.7* 9.0* 7.3*  HCT 28.9* 29.6* 27.3* 22.4*  MCV 86.8 87.1 87.8 88.9  PLT 153 152 138* 116*   Advay Volante D 12/28/2010  9:42 AM

## 2010-12-28 NOTE — Progress Notes (Signed)
PT Cancellation Note Attempted to evaluate patient today.  Patient agitated earlier in day and just fell asleep per nursing.  Requested we hold PT today.  Will return in am for PT eval. Durenda Hurt. Renaldo Fiddler, [facility] Acute Rehab Services Pager [phone]

## 2010-12-28 NOTE — Progress Notes (Signed)
ANTIBIOTIC CONSULT NOTE - INITIAL  Pharmacy Consult for imipenem and vancomycin Indication: rule out sepsis  Allergies  Allergen Reactions  . Fortaz (Ceftazidime) Itching    Patient Measurements: Height: 5\' 11"  (180.3 cm) Weight: 151 lb 7.3 oz (68.7 kg) IBW/kg (Calculated) : 75.3  Adjusted Body Weight:   Vital Signs: Temp: 98.1 F (36.7 C) (12/09 0745) Temp src: Oral (12/09 0000) BP: 108/87 mmHg (12/09 0745) Pulse Rate: 84  (12/09 0729) Intake/Output from previous day: 12/08 0701 - 12/09 0700 In: 1518 [P.O.:228; I.V.:940; IV Piggyback:350] Out: 3000  Intake/Output from this shift:    Labs:  Basename 12/28/10 0455 12/27/10 0430 12/26/10 0500  WBC 5.2 6.0 5.4  HGB 9.5* 9.7* 9.0*  PLT 153 152 138*  LABCREA -- -- --  CREATININE 3.41* 4.32* 3.12*   Estimated Creatinine Clearance: 16.8 ml/min (by C-G formula based on Cr of 3.41). No results found for this basename: VANCOTROUGH:2,VANCOPEAK:2,VANCORANDOM:2,GENTTROUGH:2,GENTPEAK:2,GENTRANDOM:2,TOBRATROUGH:2,TOBRAPEAK:2,TOBRARND:2,AMIKACINPEAK:2,AMIKACINTROU:2,AMIKACIN:2, in the last 72 hours   Microbiology: Recent Results (from the past 720 hour(s))  MRSA PCR SCREENING     Status: Normal   Collection Time   12/04/2010  4:36 PM      Component Value Range Status Comment   MRSA by PCR NEGATIVE  NEGATIVE  Final   CULTURE, RESPIRATORY     Status: Normal   Collection Time   12/15/10 11:15 AM      Component Value Range Status Comment   Specimen Description ENDOTRACHEAL   Final    Special Requests NONE   Final    Gram Stain     Final    Value: ABUNDANT WBC PRESENT, PREDOMINANTLY PMN     NO SQUAMOUS EPITHELIAL CELLS SEEN     RARE GRAM POSITIVE COCCI IN PAIRS   Culture Non-Pathogenic Oropharyngeal-type Flora Isolated.   Final    Report Status 12/18/2010 FINAL   Final   CULTURE, BLOOD (ROUTINE X 2)     Status: Normal   Collection Time   12/16/10  1:00 PM      Component Value Range Status Comment   Specimen Description BLOOD  HAND LEFT   Final    Special Requests BOTTLES DRAWN AEROBIC ONLY 3.0 CC    Final    Setup Time [id number]   Final    Culture NO GROWTH 5 DAYS   Final    Report Status 12/22/2010 FINAL   Final   CULTURE, BLOOD (ROUTINE X 2)     Status: Normal   Collection Time   12/16/10  1:10 PM      Component Value Range Status Comment   Specimen Description BLOOD HAND LEFT   Final    Special Requests BOTTLES DRAWN AEROBIC ONLY 2.0CC   Final    Setup Time [id number]   Final    Culture NO GROWTH 5 DAYS   Final    Report Status 12/22/2010 FINAL   Final   CULTURE, RESPIRATORY     Status: Normal (Preliminary result)   Collection Time   12/25/10  5:35 PM      Component Value Range Status Comment   Specimen Description TRACHEAL ASPIRATE   Final    Special Requests NONE   Final    Gram Stain     Final    Value: RARE WBC PRESENT, PREDOMINANTLY PMN     NO SQUAMOUS EPITHELIAL CELLS SEEN     NO ORGANISMS SEEN   Culture NO GROWTH 1 DAY   Final    Report Status PENDING   Incomplete   CULTURE,  BLOOD (ROUTINE X 2)     Status: Normal (Preliminary result)   Collection Time   12/25/10  5:48 PM      Component Value Range Status Comment   Specimen Description BLOOD RIGHT HAND   Final    Special Requests BOTTLES DRAWN AEROBIC AND ANAEROBIC 10CC EACH   Final    Setup Time [id number]   Final    Culture     Final    Value:        BLOOD CULTURE RECEIVED NO GROWTH TO DATE CULTURE WILL BE HELD FOR 5 DAYS BEFORE ISSUING A FINAL NEGATIVE REPORT   Report Status PENDING   Incomplete   CULTURE, BLOOD (ROUTINE X 2)     Status: Normal (Preliminary result)   Collection Time   12/25/10  6:20 PM      Component Value Range Status Comment   Specimen Description BLOOD RIGHT HAND   Final    Special Requests BOTTLES DRAWN AEROBIC ONLY 10CC   Final    Setup Time [id number]   Final    Culture     Final    Value:        BLOOD CULTURE RECEIVED NO GROWTH TO DATE CULTURE WILL BE HELD FOR 5 DAYS BEFORE ISSUING A FINAL NEGATIVE  REPORT   Report Status PENDING   Incomplete   CLOSTRIDIUM DIFFICILE BY PCR     Status: Normal   Collection Time   12/25/10  9:54 PM      Component Value Range Status Comment   C difficile by pcr NEGATIVE  NEGATIVE  Final     Medical History: Past Medical History  Diagnosis Date  . Chronic systolic heart failure 09/06/2009  . ESRD on hemodialysis     Tues, Thurs, Sat  . Cardiomyopathy   . HTN (hypertension)   . Hyperlipemia   . Anemia     Multifactorial - iron deficiency and anemia of chronic disease in setting of ESRD. Last anemia panel (07/2005 ) - Fe 16, TIBC  203, Ferritin 350.  Marland Kitchen Gout   . Tricuspid regurgitation   . Thrombus     H/o apical thrombus  . Cardiac arrest 12/09/2007    with pulseless electrical activity 2/2 severe hyperkalemia (K > 7.5)  . Thyroid disease   . COPD (chronic obstructive pulmonary disease)   . GERD (gastroesophageal reflux disease)   . Cancer     prostate  . Rhabdomyolysis     H/O - secondary to blunt trauma to the buttock   . Secondary hyperparathyroidism   . 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 Chronic idiopathic thrombocytopenia     BL platelet 100-120.  Marland Kitchen Syncope     H/O multiple syncopal episodes thought to be vasovagal versus hypotension induced. Last episode  10/2009    Medications:  Scheduled:     . amLODipine  10 mg Per Tube QHS  . antiseptic oral rinse  1 application Mouth Rinse QID  . chlorhexidine  15 mL Mouth/Throat BID  . darbepoetin (ARANESP) injection - DIALYSIS  60 mcg Intravenous Q Thu-HD  . dextrose      . imipenem-cilastatin  250 mg Intravenous Q12H  . levalbuterol  0.63 mg Nebulization QID  . metoprolol tartrate  25 mg Oral Q6H  . pantoprazole sodium  40 mg Per Tube Q24H  . paricalcitol  2 mcg Intravenous Q T,Th,Sa-HD  . potassium phosphate (monobasic)  1,000 mg Per Tube TID WC &  HS  . vancomycin  750 mg Intravenous Q T,Th,Sa-HD   Infusions:     . sodium chloride  Stopped (12/26/10 0939)  . dextrose 40 mL/hr at 12/28/10 1610   Assessment: 76 yo male with r/o sepsis on imipenem and vancomycin. Received vancomycin dose post-HD yesterday.  Goal of Therapy:  pre-HD vanco level 15-25  Plan:  1) Continue Vancomycin 750mg  iv qHD (TThSa).  Consider checking a trough level later this week. 2) Continue Imipenem 250mg  iv q12h. 3) Length of antibiotic therapy?  Gardner Candle 12/28/2010,8:04 AM

## 2010-12-28 NOTE — Progress Notes (Signed)
Subjective: No complaints. Per nurse he had no more than 4 bites of jello today. Tan mucous has been suctioned from him. He has had loose stools and has a rectal pouch. The tip of his right middle finger is swollen but he was not aware of this. It is not painful.   Objective: Blood pressure 118/71, pulse 84, temperature 98.4 F (36.9 C), temperature source Oral, resp. rate 18, height 5\' 11"  (1.803 m), weight 68.7 kg (151 lb 7.3 oz), SpO2 100.00%. Weight change: -1.4 kg (-3 lb 1.4 oz)  Intake/Output Summary (Last 24 hours) at 12/28/10 1759 Last data filed at 12/28/10 1712  Gross per 24 hour  Intake   1550 ml  Output      1 ml  Net   1549 ml    Physical Exam: General appearance: alert, cooperative and no distress Eyes: conjunctivae/corneas clear. PERRL Throat: lips, mucosa, and tongue normal; teeth in poor condition Lungs: rhonchi bilaterally Heart: regular rate and rhythm, S1, S2 normal, no murmur Abdomen: soft, non-tender; bowel sounds normal; no masses,  no organomegaly Extremities: extremities normal, atraumatic, no cyanosis or edema- right third distal phalanx swollen, skin sloughing, clear liquid is expressible, nail is blue. Finger moderately tender but no warmth.  Neurologic: Grossly normal  Lab Results:  Basename 12/28/10 0455 12/27/10 0430  NA 134* 130*  K 4.2 4.1  CL 99 94*  CO2 27 25  GLUCOSE 87 87  BUN 19 39*  CREATININE 3.41* 4.32*  CALCIUM 8.2* 8.0*  MG 2.0 2.1  PHOS 3.5 5.3*   No results found for this basename: AST:2,ALT:2,ALKPHOS:2,BILITOT:2,PROT:2,ALBUMIN:2 in the last 72 hours No results found for this basename: LIPASE:2,AMYLASE:2 in the last 72 hours  Basename 12/28/10 0455 12/27/10 0430  WBC 5.2 6.0  NEUTROABS -- --  HGB 9.5* 9.7*  HCT 28.9* 29.6*  MCV 86.8 87.1  PLT 153 152   No results found for this basename: CKTOTAL:3,CKMB:3,CKMBINDEX:3,TROPONINI:3 in the last 72 hours No results found for this basename: POCBNP:3 in the last 72 hours No  results found for this basename: DDIMER:2 in the last 72 hours No results found for this basename: HGBA1C:2 in the last 72 hours No results found for this basename: CHOL:2,HDL:2,LDLCALC:2,TRIG:2,CHOLHDL:2,LDLDIRECT:2 in the last 72 hours No results found for this basename: TSH,T4TOTAL,FREET3,T3FREE,THYROIDAB in the last 72 hours No results found for this basename: VITAMINB12:2,FOLATE:2,FERRITIN:2,TIBC:2,IRON:2,RETICCTPCT:2 in the last 72 hours  Micro Results: Recent Results (from the past 240 hour(s))  CULTURE, RESPIRATORY     Status: Normal   Collection Time   12/25/10  5:35 PM      Component Value Range Status Comment   Specimen Description TRACHEAL ASPIRATE   Final    Special Requests NONE   Final    Gram Stain     Final    Value: RARE WBC PRESENT, PREDOMINANTLY PMN     NO SQUAMOUS EPITHELIAL CELLS SEEN     NO ORGANISMS SEEN   Culture NO GROWTH 2 DAYS   Final    Report Status 12/28/2010 FINAL   Final   CULTURE, BLOOD (ROUTINE X 2)     Status: Normal (Preliminary result)   Collection Time   12/25/10  5:48 PM      Component Value Range Status Comment   Specimen Description BLOOD RIGHT HAND   Final    Special Requests BOTTLES DRAWN AEROBIC AND ANAEROBIC [facility]   Final    Setup Time [id number]   Final    Culture     Final  Value:        BLOOD CULTURE RECEIVED NO GROWTH TO DATE CULTURE WILL BE HELD FOR 5 DAYS BEFORE ISSUING A FINAL NEGATIVE REPORT   Report Status PENDING   Incomplete   CULTURE, BLOOD (ROUTINE X 2)     Status: Normal (Preliminary result)   Collection Time   12/25/10  6:20 PM      Component Value Range Status Comment   Specimen Description BLOOD RIGHT HAND   Final    Special Requests BOTTLES DRAWN AEROBIC ONLY 10CC   Final    Setup Time [id number]   Final    Culture     Final    Value:        BLOOD CULTURE RECEIVED NO GROWTH TO DATE CULTURE WILL BE HELD FOR 5 DAYS BEFORE ISSUING A FINAL NEGATIVE REPORT   Report Status PENDING   Incomplete   CLOSTRIDIUM  DIFFICILE BY PCR     Status: Normal   Collection Time   12/25/10  9:54 PM      Component Value Range Status Comment   C difficile by pcr NEGATIVE  NEGATIVE  Final     Studies/Results: Ct Head Wo Contrast  12/12/2010  *RADIOLOGY REPORT*  Clinical Data: Syncope.  Post cardiac arrest.  End-stage renal disease.  CT HEAD WITHOUT CONTRAST  Technique:  Contiguous axial images were obtained from the base of the skull through the vertex without contrast.  Comparison: 10/09/2008.  Findings: No intracranial hemorrhage.  Prominent small vessel disease type changes.  Remote right thalamic tiny infarct.  No CT evidence of large acute infarct.  Small acute infarct cannot be excluded by CT.  No intracranial mass lesion detected on this unenhanced exam.  Global atrophy without hydrocephalus.  Vascular calcifications.  Air-fluid level right sphenoid sinus.  IMPRESSION: No intracranial hemorrhage or CT evidence of large acute infarct.  Remote small right thalamic infarct.  Prominent small vessel disease type changes.  Global atrophy.  Air-fluid level right sphenoid sinus.  Original Report Authenticated By: Fuller Canada, M.D.   Dg Hand 2 View Right  12/28/2010  *RADIOLOGY REPORT*  Clinical Data: Middle finger pain and swelling.  RIGHT HAND - 2 VIEW  Comparison: None.  Findings: Soft tissue swelling is seen involving the distal middle finger.  There is no evidence of fracture or bone destruction.  No evidence of soft tissue gas or radiopaque foreign body.  No evidence of arthritis or other significant bone abnormality.  IMPRESSION: Soft tissue swelling of the distal little finger.  No osseous abnormality.  Original Report Authenticated By: Danae Orleans, M.D.   Ir Fluoro Guide Cv Line Right  12/02/2010  *RADIOLOGY REPORT*  Clinical Data: End-stage renal disease, poor functioning right IJ dialysis catheter  FLUOROSCOPIC EXCHANGE RIGHT IJ TUNNEL DIALYSIS CATHETER  Date:  12/02/2010 09:30:00  Radiologist:  Judie Petit. Ruel Favors, M.D.  Medications:  1 gram vancomycin administered within 2 hours of the procedure, 1 mg Versed, 50 mcg Fentanyl  Guidance:  Ultrasound and fluoroscopic  Fluoroscopy time:  1.1 minutes  Sedation time:  15 minutes  Contrast volume:  None.  Complications:  No immediate  PROCEDURE/FINDINGS:  Informed consent was obtained from the patient following explanation of the procedure, risks, benefits and alternatives. The patient understands, agrees and consents for the procedure. All questions were addressed.  A time out was performed.  Maximal barrier sterile technique utilized including caps, mask, sterile gowns, sterile gloves, large sterile drape, hand hygiene, and Chloroprep  Under sterile conditions  and local anesthesia, the existing right IJ tunnel dialysis catheter was removed utilizing sharp and blunt dissection to release the retention cuff.  Catheter was removed entirely and intact over a stiff Glidewire.  A new 23 cm tip to cuff Equistream catheter was advanced over the Glidewire with tips position in the SVC RA junction.  Blood aspirated easily from both lumens.  This was followed by saline and heparin flushes.  Caps applied.  Catheter secured with a Prolene suture.  Sterile dressing placed at the site.  No immediate complication.  The patient tolerated the procedure well.  IMPRESSION: Successful fluoroscopic exchange of the right IJ tunnel dialysis catheter for a 23 cm tip to cuff Equistream catheter.  Tips SVC RA junction.  Access ready for use.  Original Report Authenticated By: Judie Petit. Ruel Favors, M.D.   Dg Chest Port 1 View  12/25/2010  *RADIOLOGY REPORT*  Clinical Data: Endotracheal tube placement, shortness of breath  PORTABLE CHEST - 1 VIEW  Comparison: Portable chest x-ray of 12/24/2010  Findings: There has been an increase in opacity at the lung bases. This may reflect atelectasis and effusions, but pneumonia cannot be excluded.  Cardiomegaly is stable.  Endotracheal tube, left central venous  line, and right central venous line are unchanged in position.  IMPRESSION: Increase in basilar opacities left greater than right.  Possible atelectasis and effusions but cannot exclude pneumonia.  Original Report Authenticated By: Juline Patch, M.D.   Dg Chest Port 1 View  12/24/2010  *RADIOLOGY REPORT*  Clinical Data: Assess endotracheal tube.  PORTABLE CHEST - 1 VIEW  Comparison: 12/23/2010.  Findings: Endotracheal tube, nasogastric tube and left subclavian central line appear unchanged.  Right IJ dialysis catheter again noted.  Dense retrocardiac density is present compatible with atelectasis, airspace disease and left pleural effusion.  Slight worsening of aeration at the right lung base probably represents either edema or atelectasis.  This may represent a combination of both.  IMPRESSION:  1.  Stable support apparatus in good position. 2.  Worsening of aeration at the right lung base, likely increasing atelectasis.  Original Report Authenticated By: Andreas Newport, M.D.   Dg Chest Port 1 View  12/23/2010  *RADIOLOGY REPORT*  Clinical Data: Endotracheal tube placement  PORTABLE CHEST - 1 VIEW  Comparison: Portable chest x-ray of 12/22/2010  Findings: The tip of the endotracheal tube is approximately 3.8 cm above the carina.  Aeration has improved with improvement in pulmonary vascular congestion as well.  There is still persistent opacity at the left lung base which may represent atelectasis, effusion, or possibly pneumonia.  Mild cardiomegaly is stable. Left and right central venous lines remain.  IMPRESSION:  1.  Improved aeration and improved pulmonary vascular congestion. 2.  Tip of endotracheal tube 3.8 cm above carina. 3.  Persistent left basilar opacity.  Original Report Authenticated By: Juline Patch, M.D.   Dg Chest Port 1 View  12/22/2010  *RADIOLOGY REPORT*  Clinical Data: Endotracheal tube placement  PORTABLE CHEST - 1 VIEW  Comparison: 12/21/2010  Findings: Cardiomediastinal silhouette  is stable.  Slight worsening congestion/pulmonary edema.  Stable endotracheal tube position with tip 5.2 cm above the carina.  Right dialysis catheter is unchanged in position.  Stable left subclavian central line position. Probable bilateral small pleural effusion and bilateral basilar atelectasis or infiltrate.  IMPRESSION:  Slight worsening congestion/pulmonary edema.  Stable endotracheal tube position with tip 5.2 cm above the carina.  Right dialysis catheter is unchanged in position.  Stable left subclavian central  line position.  Probable bilateral small pleural effusion and bilateral basilar atelectasis or infiltrate.  Original Report Authenticated By: Natasha Mead, M.D.   Dg Chest Port 1 View  12/21/2010  *RADIOLOGY REPORT*  Clinical Data: Endotracheal tube placement.  PORTABLE CHEST - 1 VIEW  Comparison: 12/20/2010  Findings: Endotracheal tube is 4.5 cm above the carina.  Improved aeration and probably decreased edema in the lower lungs.  Dialysis catheter tip near the cavoatrial junction.  Left subclavian central line in the upper SVC region.  Heart size is stable.  IMPRESSION: Improved aeration at the lung bases suggests decreased pulmonary edema.  Original Report Authenticated By: Richarda Overlie, M.D.   Dg Chest Port 1 View  12/20/2010  *RADIOLOGY REPORT*  Clinical Data: Perforation, ventilatory support, renal failure  PORTABLE CHEST - 1 VIEW  Comparison: 12/19/2010  Findings: Endotracheal tube 2.8 cm above the carina.  Bilateral central lines and NG tube stable position.  Monitor leads overlie the chest.  Heart remains enlarged with worsening diffuse pulmonary edema pattern noted.  Pleural effusions present bilaterally with basilar atelectasis / consolidation, worse in the left lower lobe. No pneumothorax.  IMPRESSION: Worsening CHF pattern.  Original Report Authenticated By: Judie Petit. Ruel Favors, M.D.   Dg Chest Port 1 View  12/19/2010  *RADIOLOGY REPORT*  Clinical Data: Assess endotracheal tube.   PORTABLE CHEST - 1 VIEW  Comparison: 12/18/2010.  Findings: Endotracheal tube tip 4 cm above the carina.  Right sided split catheter with tips in the region of the distal superior vena cava.  Left central line tip mid superior vena cava level. Nasogastric tube courses below the diaphragm.  The tip is not included on this exam.  No gross pneumothorax.  Cardiomegaly.  Central pulmonary vascular prominence/mild pulmonary edema.  Calcified tortuous aorta.  Bibasilar patchy opacity may represent atelectasis and crowding of vessels although infectious infiltrate not excluded.  IMPRESSION: No significant change.  Original Report Authenticated By: Fuller Canada, M.D.   Dg Chest Port 1 View  12/18/2010  *RADIOLOGY REPORT*  Clinical Data: Evaluate endotracheal tube position  PORTABLE CHEST - 1 VIEW  Comparison: Portable chest x-ray of 12/17/2010  Findings: The tip of the endotracheal tube is approximately 8-0.6 cm above the carina.  There is evidence of pulmonary vascular congestion present with probable small effusions.  Cardiomegaly is stable.  Left and right central venous lines remain.  IMPRESSION:  1.  Endotracheal tip approximately 2.6 cm above the carina. 2.  Little change in probable mild pulmonary vascular congestion and small effusions.  Original Report Authenticated By: Juline Patch, M.D.   Dg Chest Port 1 View  12/17/2010  *RADIOLOGY REPORT*  Clinical Data: ET tube placement, shortness of breath.  PORTABLE CHEST - 1 VIEW  Comparison: None.  Findings: Diffuse interstitial prominence throughout the lungs, likely mild edema.  Heart is borderline in size.  There is bibasilar atelectasis or infiltrates, increased since prior study.  IMPRESSION: Increasing bibasilar atelectasis or infiltrates.  Increasing interstitial prominence, likely interstitial edema.  Original Report Authenticated By: Cyndie Chime, M.D.   Dg Chest Port 1 View  12/16/2010  *RADIOLOGY REPORT*  Clinical Data: Query aspiration.   PORTABLE CHEST - 1 VIEW  Comparison: 12/16/2010 at 5:33 a.m.  Findings: The endotracheal tube tip is approximately 2 cm above the carina.  Nasogastric tube is in place.  A right internal jugular central venous catheter is noted with tip at the cavoatrial junction.  Left central line tip projects over the SVC.  Airspace opacity  noted in the left lower lobe.  There is hazy opacity along the right hemithorax but with improved aeration of the right lower lobe.  IMPRESSION: 1.  Improved aeration of the right lower lobe, with hazy opacity over the right hemithorax which could reflect a layering pleural effusion.  2.  Continued airspace opacity in the left lower lobe, possibly pneumonia or atelectasis. Aspiration pneumonitis cannot be excluded, although the endotracheal tube may have a protective effect against new aspiration.  3.  The endotracheal tube has been retracted and its tip is 2 cm above the carina.  Original Report Authenticated By: Dellia Cloud, M.D.   Dg Chest Port 1 View  12/16/2010  *RADIOLOGY REPORT*  Clinical Data: Respiratory failure.  PORTABLE CHEST - 1 VIEW  Comparison: 12/15/2010.  Findings: Endotracheal tube tip is at the level of the carina. Recommend retracting endotracheal tube tip by 3 cm.  Right central line tip distal superior vena cava level.  Left central line tip mid superior vena cava level.  Cardiomegaly.  Asymmetric air space disease most notable in the lung bases.  This may represent pulmonary edema although infiltrate not excluded in the proper clinical setting.  No gross pneumothorax.  Nasogastric tube courses below the diaphragm.  The tip is not included on this exam.  IMPRESSION: Endotracheal tube tip is at the level of the carina.   Recommend retracting endotracheal tube tip by 3 cm.  Asymmetric air space disease most notable lung bases without significant change.  Question pulmonary edema and / or infectious infiltrate?  Original Report Authenticated By: Fuller Canada,  M.D.   Dg Chest Port 1 View  12/15/2010  *RADIOLOGY REPORT*  Clinical Data: Respiratory failure.  PORTABLE CHEST - 1 VIEW  Comparison: Chest x-ray 12/10/2010.  Findings: The endotracheal tube is 13 mm above the carina and should be retracted 2 cm. The remaining support apparatus is stable.  The cardiac silhouette, mediastinal and hilar contours are stable.  The lungs demonstrate slight improved aeration with resolving edema and atelectasis.  IMPRESSION:  1.  The endotracheal tube is 13 mm above the carina and should be retracted 2 cm. 2.  The remaining support apparatus is stable. 3.  Slight improved lung aeration with resolving edema and atelectasis or infiltrates.  Original Report Authenticated By: P. Loralie Champagne, M.D.   Dg Chest Port 1 View  11/23/2010  *RADIOLOGY REPORT*  Clinical Data: Central line placement  PORTABLE CHEST - 1 VIEW  Comparison: Portable exam 1650 hours compared to 1325 hours  Findings: Left subclavian venous catheter, tip projects over SVC. Endotracheal tube, tip 3.5 cm above carina. Right jugular dual-lumen central venous catheter, tip projecting over SVC. Nasogastric tube extends into stomach. Normal heart size. Tortuous aorta with atherosclerotic calcification. Bilateral airspace infiltrates, slightly increased on the left since previous exam. No pneumothorax.  IMPRESSION: No pneumothorax following central line placement. Bilateral pulmonary infiltrates greater on the right, increased left lower lobe since previous exam.  Original Report Authenticated By: Lollie Marrow, M.D.   Dg Chest Portable 1 View  12/06/2010  *RADIOLOGY REPORT*  Clinical Data: Intubated, unresponsive  PORTABLE CHEST - 1 VIEW  Comparison: 11/13/2010  Findings: Endotracheal tube tip 3.5 cm above carina.  Nasogastric tube been placed at least as far as the stomach, tip not seen. Right IJ central line stable in position.  There are new alveolar opacities centrally in both lung bases and in the right upper  lobe. No definite effusion although the lateral costophrenic angles are  excluded.  Heart size upper limits normal for technique.  IMPRESSION: 1.  Endotracheal tube and nasogastric tube placement as above. 2.  New bibasilar right upper lobe alveolar opacities, possibly asymmetric edema versus infiltrates.  Original Report Authenticated By: Osa Craver, M.D.    Medications: Scheduled Meds:   . amLODipine  10 mg Per Tube QHS  . antiseptic oral rinse  1 application Mouth Rinse QID  . chlorhexidine  15 mL Mouth/Throat BID  . darbepoetin (ARANESP) injection - DIALYSIS  60 mcg Intravenous Q Thu-HD  . imipenem-cilastatin  250 mg Intravenous Q12H  . levalbuterol  0.63 mg Nebulization QID  . metoprolol tartrate  25 mg Oral Q6H  . pantoprazole sodium  40 mg Per Tube Q24H  . paricalcitol  2 mcg Intravenous Q T,Th,Sa-HD  . vancomycin  750 mg Intravenous Q T,Th,Sa-HD   Continuous Infusions:   . sodium chloride Stopped (12/26/10 0939)  . dextrose 40 mL/hr at 12/28/10 0521   PRN Meds:.acetaminophen, camphor-menthol, heparin, heparin, heparin, hydrALAZINE, levalbuterol, traMADol  Assessment/Plan: Active Problems:  Cardiopulmonary arrest with VDRF- defibrillated and resusitated.   Extubated 12/7 with no plan for reintubation.   Pneumonia hospital acquired: Fevers noted on 12/6. Bibasilar infiltrates on CXr.  Vanc and Primaxin started on 12/6. Zosyn was stopped on 12/5 (started on 11/26)  Diarrhea- c. Diff is negative.  Right distal middle finger trauma- The skin and nail has come off since I saw him this AM. Xray is WNL. Dr Mina Marble recommended dressings.  Shock: resolved.  ESRD (end stage renal disease) on dialysis  Hypokalemia - replaced and resolved.   Anemia- stable since 12/6. Iron deffiency noted. Can start PO iron.   Thrombocytopenia- resolved- related to sepsis?   Hypertension- BP stable on Metoprolol and Norvasc  Hypoglycemia- resolved Anoxic encephalopathy- appears  resolved. Quite alert today.  Acute on Chronic systolic CHF- stable. On Dialysis.    LOS: 15 days   [facility] [phone] 12/28/2010, 5:59 PM

## 2010-12-29 LAB — GLUCOSE, CAPILLARY
Glucose-Capillary: 56 mg/dL — ABNORMAL LOW (ref 70–99)
Glucose-Capillary: 72 mg/dL (ref 70–99)
Glucose-Capillary: 87 mg/dL (ref 70–99)
Glucose-Capillary: 98 mg/dL (ref 70–99)
Glucose-Capillary: <10 mg/dL — CL (ref 70–99)

## 2010-12-29 MED ORDER — ENSURE PUDDING PO PUDG
1.0000 | Freq: Three times a day (TID) | ORAL | Status: DC
  Administered 2010-12-29 – 2011-01-02 (×6): 1 via ORAL

## 2010-12-29 MED ORDER — PANTOPRAZOLE SODIUM 40 MG PO PACK
40.0000 mg | PACK | ORAL | Status: DC
  Administered 2010-12-30 – 2011-01-02 (×3): 40 mg via ORAL
  Filled 2010-12-29 (×4): qty 20

## 2010-12-29 MED ORDER — DEXTROSE 50 % IV SOLN: 25.0000 mL | Freq: Once | INTRAVENOUS | Status: AC | PRN

## 2010-12-29 MED ORDER — LOPERAMIDE HCL 2 MG PO CAPS
2.0000 mg | ORAL_CAPSULE | ORAL | Status: DC | PRN
  Filled 2010-12-29: qty 1

## 2010-12-29 MED ORDER — DEXTROSE 50 % IV SOLN
INTRAVENOUS | Status: AC
  Administered 2010-12-29: 25 mL
  Filled 2010-12-29: qty 50

## 2010-12-29 MED ORDER — HEPARIN SODIUM (PORCINE) 1000 UNIT/ML DIALYSIS
20.0000 [IU]/kg | INTRAMUSCULAR | Status: DC | PRN
  Administered 2010-12-30: 1400 [IU] via INTRAVENOUS_CENTRAL

## 2010-12-29 NOTE — Progress Notes (Signed)
Subjective: 77 y/o AAM with ESRD on HD who had dialysis the morning of his admission and had syncope at home afterwards. According to EMS, the patient had an AED applied by the fire department who responded first which advised a shockable rhythm. He was shocked one time. EMS gave him 1 mg of epinephrine. He was intubated orally at the scene. He also had an EJ established at the scene as well. Sinus rhythm with multiple premature beats was established with palpable blood pressure. He was initially admitted to the critical care service due to his ventilator dependent status. He was transferred to [facility] on December 9.  The patient's daughter is at the bedside. She reports he is improved significantly in regard to his mental status even of the last 24 hours. The patient is alert and can answer simple questions. He does give some responses that are nonsensical. He denies any complaints whatsoever but his history is of questionable reliability.  Objective: Weight change: -0.4 kg (-14.1 oz)  Intake/Output Summary (Last 24 hours) at 12/29/10 1558 Last data filed at 12/29/10 1400  Gross per 24 hour  Intake   1495 ml  Output     10 ml  Net   1485 ml   Blood pressure 124/74, pulse 70, temperature 97.5 F (36.4 C), temperature source Oral, resp. rate 21, height 5\' 11"  (1.803 m), weight 68.7 kg (151 lb 7.3 oz), SpO2 100.00%. Temp:  [97.5 F (36.4 C)-98.8 F (37.1 C)] 97.5 F (36.4 C) (12/10 1143) Pulse Rate:  [70-82] 70  (12/10 0810) Resp:  [14-25] 21  (12/10 0810) BP: (111-128)/(68-86) 124/74 mmHg (12/10 0810) SpO2:  [96 %-100 %] 100 % (12/10 1143) Weight:  [68.7 kg (151 lb 7.3 oz)] 151 lb 7.3 oz (68.7 kg) (12/10 0500)  Physical Exam: General: No acute respiratory distress Lungs: Clear to auscultation bilaterally without wheezes or crackles with exception to mild bibasilar crack Cardiovascular: Regular rate and rhythm without murmur gallop or rub normal S1 and S2 Abdomen:  Nontender, nondistended, soft, bowel sounds positive, no rebound, no ascites, no appreciable mass Extremities: No significant cyanosis, clubbing, or edema bilateral lower extremities-right middle finger is currently dressed and dry  Lab Results:  [facility] 12/28/10 0455 12/27/10 0430  NA 134* 130*  K 4.2 4.1  CL 99 94*  CO2 27 25  GLUCOSE 87 87  BUN 19 39*  CREATININE 3.41* 4.32*  CALCIUM 8.2* 8.0*  MG 2.0 2.1  PHOS 3.5 5.3*   No results found for this basename: AST:2,ALT:2,ALKPHOS:2,BILITOT:2,PROT:2,ALBUMIN:2 in the last 72 hours No results found for this basename: LIPASE:2,AMYLASE:2 in the last 72 hours  Basename 12/28/10 0455 12/27/10 0430  WBC 5.2 6.0  NEUTROABS -- --  HGB 9.5* 9.7*  HCT 28.9* 29.6*  MCV 86.8 87.1  PLT 153 152   No results found for this basename: CKTOTAL:3,CKMB:3,CKMBINDEX:3,TROPONINI:3 in the last 72 hours No components found with this basename: POCBNP:3 No results found for this basename: DDIMER:2 in the last 72 hours No results found for this basename: HGBA1C:12 in the last 72 hours No results found for this basename: CHOL:2,HDL:2,LDLCALC:2,TRIG:2,CHOLHDL:2,LDLDIRECT:2 in the last 72 hours No results found for this basename: TSH,T4TOTAL,FREET3,T3FREE,THYROIDAB in the last 72 hours No results found for this basename: VITAMINB12:2,FOLATE:2,FERRITIN:2,TIBC:2,IRON:2,RETICCTPCT:2 in the last 72 hours  Micro Results: Recent Results (from the past 240 hour(s))  CULTURE, RESPIRATORY     Status: Normal   Collection Time   12/25/10  5:35 PM      Component Value Range Status  Comment   Specimen Description TRACHEAL ASPIRATE   Final    Special Requests NONE   Final    Gram Stain     Final    Value: RARE WBC PRESENT, PREDOMINANTLY PMN     NO SQUAMOUS EPITHELIAL CELLS SEEN     NO ORGANISMS SEEN   Culture NO GROWTH 2 DAYS   Final    Report Status 12/28/2010 FINAL   Final   CULTURE, BLOOD (ROUTINE X 2)     Status: Normal (Preliminary result)   Collection Time     12/25/10  5:48 PM      Component Value Range Status Comment   Specimen Description BLOOD RIGHT HAND   Final    Special Requests BOTTLES DRAWN AEROBIC AND ANAEROBIC 10CC EACH   Final    Setup Time [id number]   Final    Culture     Final    Value:        BLOOD CULTURE RECEIVED NO GROWTH TO DATE CULTURE WILL BE HELD FOR 5 DAYS BEFORE ISSUING A FINAL NEGATIVE REPORT   Report Status PENDING   Incomplete   CULTURE, BLOOD (ROUTINE X 2)     Status: Normal (Preliminary result)   Collection Time   12/25/10  6:20 PM      Component Value Range Status Comment   Specimen Description BLOOD RIGHT HAND   Final    Special Requests BOTTLES DRAWN AEROBIC ONLY 10CC   Final    Setup Time [id number]   Final    Culture     Final    Value:        BLOOD CULTURE RECEIVED NO GROWTH TO DATE CULTURE WILL BE HELD FOR 5 DAYS BEFORE ISSUING A FINAL NEGATIVE REPORT   Report Status PENDING   Incomplete   CLOSTRIDIUM DIFFICILE BY PCR     Status: Normal   Collection Time   12/25/10  9:54 PM      Component Value Range Status Comment   C difficile by pcr NEGATIVE  NEGATIVE  Final     Studies/Results: Scheduled Meds:   . amLODipine  10 mg Per Tube QHS  . antiseptic oral rinse  1 application Mouth Rinse QID  . chlorhexidine  15 mL Mouth/Throat BID  . darbepoetin (ARANESP) injection - DIALYSIS  60 mcg Intravenous Q Thu-HD  . imipenem-cilastatin  250 mg Intravenous Q12H  . levalbuterol  0.63 mg Nebulization QID  . metoprolol tartrate  25 mg Oral Q6H  . pantoprazole sodium  40 mg Per Tube Q24H  . paricalcitol  2 mcg Intravenous Q T,Th,Sa-HD  . vancomycin  750 mg Intravenous Q T,Th,Sa-HD   Continuous Infusions:   . sodium chloride Stopped (12/26/10 0939)  . dextrose 40 mL/hr at 12/28/10 2000   PRN Meds:.acetaminophen, camphor-menthol, heparin, heparin, hydrALAZINE, levalbuterol, traMADol, DISCONTD: heparin, DISCONTD: heparin  Assessment/Plan:  VDRF in setting of cardiac arrest extubated 12/7 with no plan for  reintubation  Cardiac arrest with presumed VF with hx of systolic CHF and post-dialysis hypokalemia   Purulent bronchitis with ?PNA (hospital acquired) Will followup chest x-ray in am  Diarrhea Likely due to antibiotics-C. difficile negative-add agents to assist in adding bulk  R distal middle finger traumatic injury Has been seen by hand surgeon-continue their recommendations for care  Shock:  Resolved Per family discussion no further escalation of care, CPR, cardioversion or pressors  Hypokalemia Resolved  Thrombocytopenia Resolved  HTN Well-controlled at present  Acute on chronic systolic CHF Volume being handled via  dialysis  ESRD  HD per renal on his usual T,Th,Sat schedule  Anoxic encephalopathy:  Slowly improving-I explained to family there may be some permanent residual cognitive deficit and that only time will tell-for now the patient appears to be clearly improving  Anemia  Hemoglobin is holding steady  CODE STATUS: DNR   DISPOSITION No need for swallow evaluation, patient is not to be reintubated regardless so will allow to have clear liquid and advance for comfort as tolerated.  PT/OT        LOS: 16 days   Miosotis Wetsel T 12/29/2010, 3:58 PM

## 2010-12-29 NOTE — Progress Notes (Signed)
Subjective: Pt is still extubated, lying in bed. Right middle finger wrapped. He is able to follow commands and move all 4 extremities. States he is not in any pain and getting some rest. No complaints at this time.  Objective: Vital signs in last 24 hours: Filed Vitals:   12/29/10 0500 12/29/10 0614 12/29/10 0810 12/29/10 0850  BP:  120/72 124/74   Pulse:  81 70   Temp:   98.7 F (37.1 C)   TempSrc:   Oral   Resp:   21   Height:      Weight: 151 lb 7.3 oz (68.7 kg)     SpO2:   100% 100%   Weight change: -14.1 oz (-0.4 kg)  Intake/Output Summary (Last 24 hours) at 12/29/10 0911 Last data filed at 12/29/10 0900  Gross per 24 hour  Intake   1490 ml  Output     11 ml  Net   1479 ml   Physical Exam: Physical Exam: Neurologic Examination:  Mental Status:  Patient does respond to verbal stimuli intermittently, and is able to follow commands. He responds to his name and can name himself.  Cranial Nerves:  II: patient does respond to confrontation bilaterally, pupils right 3 mm, left 3 mm,and intact bilaterally  III,IV,VI: intact grossly .  V,VII: no facial droop noted, speech slow and halting  VIII: patient does not respond to verbal stimuli  IX,X: gag reflex present, XI: trapezius strength unable to test bilaterally  XII: tongue strength unable to test  Motor:  Moves all 4 extremities spontaneously and follows basic commands.  Sensory:  Sensation intact. Deep Tendon Reflexes:  Not tested  Plantars:  downgoing  Cerebellar:  Not tested  Lab Results: Basic Metabolic Panel:  Lab 12/28/10 1191 12/27/10 0430  NA 134* 130*  K 4.2 4.1  CL 99 94*  CO2 27 25  GLUCOSE 87 87  BUN 19 39*  CREATININE 3.41* 4.32*  CALCIUM 8.2* 8.0*  MG 2.0 2.1  PHOS 3.5 5.3*  CBC:  Lab 12/28/10 0455 12/27/10 0430  WBC 5.2 6.0  NEUTROABS -- --  HGB 9.5* 9.7*  HCT 28.9* 29.6*  MCV 86.8 87.1  PLT 153 152  CBG:  Lab 12/29/10 0812 12/29/10 0400 12/28/10 2352 12/28/10 1947 12/28/10 1551  12/28/10 1143  GLUCAP 71 82 87 83 82 72   Anemia Panel:  Lab 12/23/10 0900  VITAMINB12 --  FOLATE --  FERRITIN --  TIBC 91*  IRON 17*  RETICCTPCT --   Micro Results: Recent Results (from the past 240 hour(s))  CULTURE, RESPIRATORY     Status: Normal   Collection Time   12/25/10  5:35 PM      Component Value Range Status Comment   Specimen Description TRACHEAL ASPIRATE   Final    Special Requests NONE   Final    Gram Stain     Final    Value: RARE WBC PRESENT, PREDOMINANTLY PMN     NO SQUAMOUS EPITHELIAL CELLS SEEN     NO ORGANISMS SEEN   Culture NO GROWTH 2 DAYS   Final    Report Status 12/28/2010 FINAL   Final   CULTURE, BLOOD (ROUTINE X 2)     Status: Normal (Preliminary result)   Collection Time   12/25/10  5:48 PM      Component Value Range Status Comment   Specimen Description BLOOD RIGHT HAND   Final    Special Requests BOTTLES DRAWN AEROBIC AND ANAEROBIC 10CC EACH   Final  Setup Time [id number]   Final    Culture     Final    Value:        BLOOD CULTURE RECEIVED NO GROWTH TO DATE CULTURE WILL BE HELD FOR 5 DAYS BEFORE ISSUING A FINAL NEGATIVE REPORT   Report Status PENDING   Incomplete   CULTURE, BLOOD (ROUTINE X 2)     Status: Normal (Preliminary result)   Collection Time   12/25/10  6:20 PM      Component Value Range Status Comment   Specimen Description BLOOD RIGHT HAND   Final    Special Requests BOTTLES DRAWN AEROBIC ONLY 10CC   Final    Setup Time [id number]   Final    Culture     Final    Value:        BLOOD CULTURE RECEIVED NO GROWTH TO DATE CULTURE WILL BE HELD FOR 5 DAYS BEFORE ISSUING A FINAL NEGATIVE REPORT   Report Status PENDING   Incomplete   CLOSTRIDIUM DIFFICILE BY PCR     Status: Normal   Collection Time   12/25/10  9:54 PM      Component Value Range Status Comment   C difficile by pcr NEGATIVE  NEGATIVE  Final    Studies/Results: Dg Hand 2 View Right  12/28/2010  *RADIOLOGY REPORT*  Clinical Data: Middle finger pain and swelling.   RIGHT HAND - 2 VIEW  Comparison: None.  Findings: Soft tissue swelling is seen involving the distal middle finger.  There is no evidence of fracture or bone destruction.  No evidence of soft tissue gas or radiopaque foreign body.  No evidence of arthritis or other significant bone abnormality.  IMPRESSION: Soft tissue swelling of the distal little finger.  No osseous abnormality.  Original Report Authenticated By: Danae Orleans, M.D.   Medications: I have reviewed the patient's current medications. Scheduled Meds:   . amLODipine  10 mg Per Tube QHS  . antiseptic oral rinse  1 application Mouth Rinse QID  . chlorhexidine  15 mL Mouth/Throat BID  . darbepoetin (ARANESP) injection - DIALYSIS  60 mcg Intravenous Q Thu-HD  . imipenem-cilastatin  250 mg Intravenous Q12H  . levalbuterol  0.63 mg Nebulization QID  . metoprolol tartrate  25 mg Oral Q6H  . pantoprazole sodium  40 mg Per Tube Q24H  . paricalcitol  2 mcg Intravenous Q T,Th,Sa-HD  . vancomycin  750 mg Intravenous Q T,Th,Sa-HD   Continuous Infusions:   . sodium chloride Stopped (12/26/10 0939)  . dextrose 40 mL/hr at 12/28/10 2000   PRN Meds:.acetaminophen, camphor-menthol, heparin, heparin, hydrALAZINE, levalbuterol, traMADol, DISCONTD: heparin, DISCONTD: heparin Assessment/Plan: 1. Acute encephalopathy- probably post anoxic, pt appears to be making progress. Still confused at times but following commands. Would continue PT/OT. Once patient more stable would recommend MRI of brain. Will sign off at this point. Will likely have some residual of anoxic brain injury.  2. If questions or problems please call.  Other medical problems:  Cardiopulmonary arrest  ESRD (end stage renal disease) on dialysis  Hypokalemia  Anemia  Thrombocytopenia  Hypertension  Hypoglycemia   LOS: 16 days   KOLLAR, Sou Nohr 12/29/2010, 9:11 AM

## 2010-12-29 NOTE — Consults (Signed)
  Assessment/Plan:  1. S/p cardiac arrest-VDRF & Cardiomyopathy s/p extubation  2. ESRD- usual days TTS, next treatment in AM  3. Anemia-ABLA transfused  4. HTN/volume- stable  5. Neuro- alert but weak post arrest     S:Awake and verbal O:BP 120/72  Pulse 81  Temp(Src) 98.3 F (36.8 C) (Oral)  Resp 22  Ht 5\' 11"  (1.803 m)  Wt 68.7 kg (151 lb 7.3 oz)  BMI 21.12 kg/m2  SpO2 99%  Intake/Output Summary (Last 24 hours) at 12/29/10 0805 Last data filed at 12/29/10 0454  Gross per 24 hour  Intake   1530 ml  Output     11 ml  Net   1519 ml   Weight change: -0.4 kg (-14.1 oz) UJW:JXBJ appaering  CVS:RRR Resp:weak effort YNW:GNFA Ext:tr edema bilat LE    . amLODipine  10 mg Per Tube QHS  . antiseptic oral rinse  1 application Mouth Rinse QID  . chlorhexidine  15 mL Mouth/Throat BID  . darbepoetin (ARANESP) injection - DIALYSIS  60 mcg Intravenous Q Thu-HD  . imipenem-cilastatin  250 mg Intravenous Q12H  . levalbuterol  0.63 mg Nebulization QID  . metoprolol tartrate  25 mg Oral Q6H  . pantoprazole sodium  40 mg Per Tube Q24H  . paricalcitol  2 mcg Intravenous Q T,Th,Sa-HD  . vancomycin  750 mg Intravenous Q T,Th,Sa-HD   Dg Hand 2 View Right  12/28/2010  *RADIOLOGY REPORT*  Clinical Data: Middle finger pain and swelling.  RIGHT HAND - 2 VIEW  Comparison: None.  Findings: Soft tissue swelling is seen involving the distal middle finger.  There is no evidence of fracture or bone destruction.  No evidence of soft tissue gas or radiopaque foreign body.  No evidence of arthritis or other significant bone abnormality.  IMPRESSION: Soft tissue swelling of the distal little finger.  No osseous abnormality.  Original Report Authenticated By: Danae Orleans, M.D.   BMET  Lab 12/28/10 0455 12/27/10 0430 12/26/10 0500 12/25/10 0500 12/24/10 0355 12/23/10 0425  NA 134* 130* 135 134* 133* 132*  K 4.2 4.1 3.5 3.3* 3.3* 3.2*  CL 99 94* 100 98 99 97  CO2 27 25 26 26 28 27   GLUCOSE 87 87 91  93 95 107*  BUN 19 39* 30* 40* 23 28*  CREATININE 3.41* 4.32* 3.12* 4.18* 2.77* 3.55*  ALB -- -- -- -- -- --  CALCIUM 8.2* 8.0* 8.0* 8.2* 7.9* 8.0*  PHOS 3.5 5.3* 1.9* 2.1* 1.7* 2.0*   CBC  Lab 12/28/10 0455 12/27/10 0430 12/26/10 0500 12/25/10 0500  WBC 5.2 6.0 5.4 5.7  NEUTROABS -- -- -- --  HGB 9.5* 9.7* 9.0* 7.3*  HCT 28.9* 29.6* 27.3* 22.4*  MCV 86.8 87.1 87.8 88.9  PLT 153 152 138* 116*   Heavenly Christine C

## 2010-12-29 NOTE — Progress Notes (Signed)
Physical Therapy Evaluation Patient Details Name: Jackson [name] MRN: [id number] DOB: Jun 18, 1928 Today's Date: 12/29/2010  Problem List:  Patient Active Problem List  Diagnoses  . CHRONIC SYSTOLIC HEART FAILURE  . Syncope  . Cardiopulmonary arrest  . ESRD (end stage renal disease) on dialysis  . Hypokalemia  . Anemia  . Thrombocytopenia  . Hypertension  . Hypoglycemia    Past Medical History:  Past Medical History  Diagnosis Date  . Chronic systolic heart failure 09/06/2009  . ESRD on hemodialysis     Tues, Thurs, Sat  . Cardiomyopathy   . HTN (hypertension)   . Hyperlipemia   . Anemia     Multifactorial - iron deficiency and anemia of chronic disease in setting of ESRD. Last anemia panel (07/2005 ) - Fe 16, TIBC  203, Ferritin 350.  Marland Kitchen Gout   . Tricuspid regurgitation   . Thrombus     H/o apical thrombus  . Cardiac arrest 12/09/2007    with pulseless electrical activity 2/2 severe hyperkalemia (K > 7.5)  . Thyroid disease   . COPD (chronic obstructive pulmonary disease)   . GERD (gastroesophageal reflux disease)   . Cancer     prostate  . Rhabdomyolysis     H/O - secondary to blunt trauma to the buttock   . Secondary hyperparathyroidism   . 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 Chronic idiopathic thrombocytopenia     BL platelet 100-120.  Marland Kitchen Syncope     H/O multiple syncopal episodes thought to be vasovagal versus hypotension induced. Last episode  10/2009   Past Surgical History:  Past Surgical History  Procedure Date  . Hernia repair   . Av fistula placement   . Jugular catheter placement   . 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 Right common iliac artery aneurysm repair 10/2009    PT Assessment/Plan/Recommendation PT Assessment Clinical Impression Statement: Pt 77 y/o male that was admitted for cardiac arrest and recently dx with PNA.  Pt overall fatigue and limited due to cognitive  impairments.  Pt will benefit from Acute PT service to improve overall mobility and prepare for safe d/c to next venue. PT Recommendation/Assessment: Patient will need skilled PT in the acute care venue PT Problem List: Decreased strength;Decreased range of motion;Decreased activity tolerance;Decreased balance;Decreased mobility;Decreased cognition;Decreased knowledge of use of DME;Decreased safety awareness;Decreased knowledge of precautions Barriers to Discharge: Other (comment) (unable to get hold of daugter to determine caregiver support) PT Therapy Diagnosis : Difficulty walking;Altered mental status;Abnormality of gait PT Plan PT Frequency: Min 3X/week PT Treatment/Interventions: DME instruction;Gait training;Stair training;Functional mobility training;Therapeutic exercise;Therapeutic activities;Balance training;Patient/family education PT Recommendation Follow Up Recommendations: Skilled nursing facility Equipment Recommended: Defer to next venue PT Goals  Acute Rehab PT Goals PT Goal Formulation: Patient unable to participate in goal setting Time For Goal Achievement: 2 weeks Pt will Roll Supine to Left Side: with supervision;with rail PT Goal: Rolling Supine to Left Side - Progress: Not met Pt will go Supine/Side to Sit: with min assist;with rail PT Goal: Supine/Side to Sit - Progress: Not met Pt will Sit at [facility] of Bed: with modified independence;1-2 min;with bilateral upper extremity support PT Goal: Sit at Edge Of Bed - Progress: Not met Pt will go Sit to Supine/Side: with min assist PT Goal: Sit to Supine/Side - Progress: Not met Pt will go Sit to Stand: with min assist PT Goal: Sit to Stand - Progress: Not met  Pt will go Stand to Sit: with min assist PT Goal: Stand to Sit - Progress: Not met Pt will Transfer Bed to Chair/Chair to Bed: with min assist PT Transfer Goal: Bed to Chair/Chair to Bed - Progress: Not met Pt will Ambulate: 1 - 15 feet;with +2 total assist;with  rolling walker;Other (comment) ((pt 60%)) PT Goal: Ambulate - Progress: Not met  PT Evaluation Precautions/Restrictions  Precautions Precautions: Fall Restrictions Weight Bearing Restrictions: No Prior Functioning  Home Living Additional Comments: Pt unable to answer home living information.  Attempted to call daughter but no answer at this time. Prior Function Comments: Pt unable to answer prior level function at this time due to cognitive impairment. Cognition Cognition Arousal/Alertness: Awake/alert Overall Cognitive Status: Impaired Attention: Impaired Current Attention Level: Focused Attention - Other Comments: Pt unable to focus and needs constant cues for redirection and task Memory: Appears impaired Memory Deficits: Unable to recall or respond to simple questions Orientation Level: Disoriented to time;Disoriented to place;Disoriented to situation Safety/Judgement: Decreased awareness of safety precautions Decreased Safety/Judgement: Decreased awareness of need for assistance Problem Solving: Requires assistance for problem solving Sensation/Coordination   Extremity Assessment RUE Assessment RUE Assessment: Within Functional Limits (Pt with initial flexed finger and elbow but able to extend.) LUE Assessment LUE Assessment: Within Functional Limits RLE Assessment RLE Assessment: Exceptions to [facility] RLE Strength RLE Overall Strength: Due to impaired cognition;Other (Comment) (at least 2+/5 gross) LLE Assessment LLE Assessment: Exceptions to [facility] LLE Strength LLE Overall Strength: Due to impaired cognition;Other (Comment) (pt at least 2+/5 gross) Mobility (including Balance) Bed Mobility Bed Mobility: Yes Supine to Sit: 1: +2 Total assist;Patient percentage (comment);HOB elevated (Comment degrees);Other (comment) ((Pt 50%)) Supine to Sit Details (indicate cue type and reason): Pt needed constant cues for proper technique and manual cues to scoot hips EOB and elevate  trunk OOB. Transfers Transfers: Yes Sit to Stand: 1: +2 Total assist;Patient percentage (comment);From elevated surface;From bed;Other (comment) ((Pt 50%)) Sit to Stand Details (indicate cue type and reason): Pt needed (A) to initiate transfer with cues for proper hand placement.  Cues to attempt standing upright once in standing.  Stand to Sit: 1: +2 Total assist;Patient percentage (comment);To chair/3-in-1 ((pt 50%)) Stand to Sit Details: (A) to slowly descend to recliner and VCs for hand and LE placment.   Stand Pivot Transfers: 1: +2 Total assist;Patient percentage (comment);Other (comment) ((Pt 50%)) Stand Pivot Transfer Details (indicate cue type and reason): VCs and manual cues for LE placement and hip placement.  Pt remains in forward flex posture throughout transfer.  Posture/Postural Control Posture/Postural Control: Postural limitations Postural Limitations: pt remains in kyphotic posture in standing and sitting position. Balance Balance Assessed: Yes Static Sitting Balance Static Sitting - Balance Support: Feet supported;Bilateral upper extremity supported Static Sitting - Level of Assistance: 4: Min assist Static Sitting - Comment/# of Minutes: Pt needed initial min (A) to maintain upright posture and prevent posterior lean.  Pt sat EOB for ~83minutes. Static Standing Balance Static Standing - Balance Support: Bilateral upper extremity supported Static Standing - Level of Assistance: 1: +2 Total assist;Patient percentage (comment) ((Pt 70%)) Static Standing - Comment/# of Minutes: Pt needed +2 assist to prevent LOB.  Pt tends to lean posterior and LE supported against bed.  Pt with flexed trunk and LE. Exercise    End of Session PT - End of Session Equipment Utilized During Treatment: Gait belt;Other (comment) (3L O2) Activity Tolerance: Patient limited by fatigue Patient left: in chair;with call bell in reach Nurse Communication:  Mobility status for  transfers General Behavior During Session: [facility] for tasks performed Cognition: Impaired Cognitive Impairment: Pt with memory, safety awareness, and overall unable to follow complex commands.  Veanna Dower 12/29/2010, 11:28 AM [phone]

## 2010-12-29 NOTE — Progress Notes (Signed)
.  Clinical social worker completed patient psychosocial assessment, please see assessment in patient shadow chart.   Pt and pt family open to short term rehab at skilled nursing facility. .Clinical social worker initiated skilled nursing facility search, see placement note in patient shadow chart. CSW will complete FL2 for MD signature. CSW continuing to follow to assist with pt dc plans.   Catha Gosselin, Theresia Majors  [phone] .12/29/2010 16:34pm

## 2010-12-30 ENCOUNTER — Inpatient Hospital Stay (HOSPITAL_COMMUNITY): Payer: Medicare Other

## 2010-12-30 LAB — GLUCOSE, CAPILLARY
Glucose-Capillary: 67 mg/dL — ABNORMAL LOW (ref 70–99)
Glucose-Capillary: 80 mg/dL (ref 70–99)
Glucose-Capillary: 81 mg/dL (ref 70–99)

## 2010-12-30 LAB — CBC
HCT: 28.8 % — ABNORMAL LOW (ref 39.0–52.0)
Hemoglobin: 9.5 g/dL — ABNORMAL LOW (ref 13.0–17.0)
RBC: 3.34 MIL/uL — ABNORMAL LOW (ref 4.22–5.81)
RDW: 17 % — ABNORMAL HIGH (ref 11.5–15.5)
WBC: 5 10*3/uL (ref 4.0–10.5)

## 2010-12-30 LAB — RENAL FUNCTION PANEL
Albumin: 1.9 g/dL — ABNORMAL LOW (ref 3.5–5.2)
BUN: 28 mg/dL — ABNORMAL HIGH (ref 6–23)
CO2: 23 mEq/L (ref 19–32)
Chloride: 95 mEq/L — ABNORMAL LOW (ref 96–112)
Glucose, Bld: 73 mg/dL (ref 70–99)
Potassium: 4.1 mEq/L (ref 3.5–5.1)

## 2010-12-30 MED ORDER — PARICALCITOL 5 MCG/ML IV SOLN
INTRAVENOUS | Status: AC
  Administered 2010-12-30: 2 ug via INTRAVENOUS
  Filled 2010-12-30: qty 1

## 2010-12-30 NOTE — Consults (Signed)
Assessment/Plan:  1. S/p cardiac arrest-VDRF & Cardiomyopathy s/p extubation , with pulmonary edema and left pleural effusion 2. ESRD- usual days TTS, dialysis in progress now! 3. Anemia-ABLA transfused  4. HTN/volume- overload  5. Neuro- alert but weak post arrest(high risk for aspiration) 6. Weakness  S:coughing O:BP 136/74  Pulse 81  Temp(Src) 99 F (37.2 C) (Axillary)  Resp 19  Ht 5\' 11"  (1.803 m)  Wt 69.2 kg (152 lb 8.9 oz)  BMI 21.28 kg/m2  SpO2 100%  Intake/Output Summary (Last 24 hours) at 12/30/10 0909 Last data filed at 12/30/10 0600  Gross per 24 hour  Intake   1180 ml  Output      6 ml  Net   1174 ml   Weight change: 0.5 kg (1 lb 1.6 oz) BJY:NWGN appearing, coughing CVS:RRR Resp:rhonchi bilateral FAO:ZHYQ Ext:no edema    . amLODipine  10 mg Per Tube QHS  . antiseptic oral rinse  1 application Mouth Rinse QID  . chlorhexidine  15 mL Mouth/Throat BID  . darbepoetin (ARANESP) injection - DIALYSIS  60 mcg Intravenous Q Thu-HD  . dextrose      . feeding supplement  1 Container Oral TID BM  . imipenem-cilastatin  250 mg Intravenous Q12H  . levalbuterol  0.63 mg Nebulization QID  . metoprolol tartrate  25 mg Oral Q6H  . pantoprazole sodium  40 mg Oral Q24H  . paricalcitol  2 mcg Intravenous Q T,Th,Sa-HD  . vancomycin  750 mg Intravenous Q T,Th,Sa-HD  . DISCONTD: pantoprazole sodium  40 mg Per Tube Q24H   Dg Hand 2 View Right  12/28/2010  *RADIOLOGY REPORT*  Clinical Data: Middle finger pain and swelling.  RIGHT HAND - 2 VIEW  Comparison: None.  Findings: Soft tissue swelling is seen involving the distal middle finger.  There is no evidence of fracture or bone destruction.  No evidence of soft tissue gas or radiopaque foreign body.  No evidence of arthritis or other significant bone abnormality.  IMPRESSION: Soft tissue swelling of the distal little finger.  No osseous abnormality.  Original Report Authenticated By: Danae Orleans, M.D.   Dg Chest Port 1  View  12/30/2010  *RADIOLOGY REPORT*  Clinical Data: Shortness of breath.  PORTABLE CHEST - 1 VIEW  Comparison: Chest x-ray 12/26/2010.  Findings: The endotracheal  and NG tubes have been removed.  The right IJ catheter is stable.  The left subclavian central venous catheter is stable.  There are persistent bilateral infiltrates and a left pleural effusion.  Persistent central vascular congestion. Probable left pleural effusion.  IMPRESSION:  1. 1.  Removal of ET and NG tubes. 2.  Persistent central vascular congestion and patchy infiltrates. 3.  Probable left pleural effusion.  Original Report Authenticated By: P. Loralie Champagne, M.D.   BMET  Lab 12/30/10 0730 12/28/10 0455 12/27/10 0430 12/26/10 0500 12/25/10 0500 12/24/10 0355  NA 131* 134* 130* 135 134* 133*  K 4.1 4.2 4.1 3.5 3.3* 3.3*  CL 95* 99 94* 100 98 99  CO2 23 27 25 26 26 28   GLUCOSE 73 87 87 91 93 95  BUN 28* 19 39* 30* 40* 23  CREATININE 5.68* 3.41* 4.32* 3.12* 4.18* 2.77*  ALB -- -- -- -- -- --  CALCIUM 8.4 8.2* 8.0* 8.0* 8.2* 7.9*  PHOS 4.4 3.5 5.3* 1.9* 2.1* 1.7*   CBC  Lab 12/30/10 0730 12/28/10 0455 12/27/10 0430 12/26/10 0500  WBC 5.0 5.2 6.0 5.4  NEUTROABS -- -- -- --  HGB  9.5* 9.5* 9.7* 9.0*  HCT 28.8* 28.9* 29.6* 27.3*  MCV 86.2 86.8 87.1 87.8  PLT 207 153 152 138*   Taha Dimond C

## 2010-12-30 NOTE — Progress Notes (Addendum)
Subjective: 77 y/o AAM with ESRD on HD who had dialysis the morning of his admission and had syncope at home afterwards. According to EMS, the patient had an AED applied by the fire department who responded first which advised a shockable rhythm. He was shocked one time. EMS gave him 1 mg of epinephrine. He was intubated orally at the scene. He also had an EJ established at the scene as well. Sinus rhythm with multiple premature beats was established with palpable blood pressure. He was initially admitted to the critical care service due to his ventilator dependent status. He was transferred to [facility] 8 on December 9. Evaluated while on dialysis.  His speech is quite slurred. He is able to follow commands and agrees that he is having difficulty speaking. Many of his responses are difficult to comprehend. He is having difficulty managing secretions and gurgling is heard in his throat.   Objective: Weight change: 0.5 kg (1 lb 1.6 oz)  Intake/Output Summary (Last 24 hours) at 12/30/10 1430 Last data filed at 12/30/10 1400  Gross per 24 hour  Intake   1240 ml  Output   3504 ml  Net  -2264 ml   Blood pressure 116/71, pulse 94, temperature 99 F (37.2 C), temperature source Axillary, resp. rate 19, height 5\' 11"  (1.803 m), weight 65.7 kg (144 lb 13.5 oz), SpO2 100.00%. Temp:  [98 F (36.7 C)-99 F (37.2 C)] 99 F (37.2 C) (12/11 0645) Pulse Rate:  [73-94] 94  (12/11 1200) Resp:  [15-25] 19  (12/11 1200) BP: (100-139)/(61-82) 116/71 mmHg (12/11 1200) SpO2:  [88 %-100 %] 100 % (12/11 1105) Weight:  [65.7 kg (144 lb 13.5 oz)-69.2 kg (152 lb 8.9 oz)] 144 lb 13.5 oz (65.7 kg) (12/11 1105)  Physical Exam: General: No acute respiratory distress Lungs: Clear to auscultation bilaterally without wheezes or crackles with exception to mild bibasilar crack Cardiovascular: Regular rate and rhythm without murmur gallop or rub normal S1 and S2 Abdomen: Nontender, nondistended, soft, bowel  sounds positive, no rebound, no ascites, no appreciable mass Extremities: No significant cyanosis, clubbing, or edema bilateral lower extremities-right middle finger is currently dressed and dry Neuro- speech slurred. Other Cranial Nerves Grossly intact. Strength 4/5 in right hand but 5/5 in all other extremities. Following commands well.   Lab Results:  [facility] 12/30/10 0730 12/28/10 0455  NA 131* 134*  K 4.1 4.2  CL 95* 99  CO2 23 27  GLUCOSE 73 87  BUN 28* 19  CREATININE 5.68* 3.41*  CALCIUM 8.4 8.2*  MG -- 2.0  PHOS 4.4 3.5    Basename 12/30/10 0730  AST --  ALT --  ALKPHOS --  BILITOT --  PROT --  ALBUMIN 1.9*   No results found for this basename: LIPASE:2,AMYLASE:2 in the last 72 hours  Basename 12/30/10 0730 12/28/10 0455  WBC 5.0 5.2  NEUTROABS -- --  HGB 9.5* 9.5*  HCT 28.8* 28.9*  MCV 86.2 86.8  PLT 207 153   No results found for this basename: CKTOTAL:3,CKMB:3,CKMBINDEX:3,TROPONINI:3 in the last 72 hours No components found with this basename: POCBNP:3 No results found for this basename: DDIMER:2 in the last 72 hours No results found for this basename: HGBA1C:12 in the last 72 hours No results found for this basename: CHOL:2,HDL:2,LDLCALC:2,TRIG:2,CHOLHDL:2,LDLDIRECT:2 in the last 72 hours No results found for this basename: TSH,T4TOTAL,FREET3,T3FREE,THYROIDAB in the last 72 hours No results found for this basename: VITAMINB12:2,FOLATE:2,FERRITIN:2,TIBC:2,IRON:2,RETICCTPCT:2 in the last 72 hours  Micro Results: Recent Results (from the past  240 hour(s))  CULTURE, RESPIRATORY     Status: Normal   Collection Time   12/25/10  5:35 PM      Component Value Range Status Comment   Specimen Description TRACHEAL ASPIRATE   Final    Special Requests NONE   Final    Gram Stain     Final    Value: RARE WBC PRESENT, PREDOMINANTLY PMN     NO SQUAMOUS EPITHELIAL CELLS SEEN     NO ORGANISMS SEEN   Culture NO GROWTH 2 DAYS   Final    Report Status 12/28/2010 FINAL    Final   CULTURE, BLOOD (ROUTINE X 2)     Status: Normal (Preliminary result)   Collection Time   12/25/10  5:48 PM      Component Value Range Status Comment   Specimen Description BLOOD RIGHT HAND   Final    Special Requests BOTTLES DRAWN AEROBIC AND ANAEROBIC 10CC EACH   Final    Setup Time [id number]   Final    Culture     Final    Value:        BLOOD CULTURE RECEIVED NO GROWTH TO DATE CULTURE WILL BE HELD FOR 5 DAYS BEFORE ISSUING A FINAL NEGATIVE REPORT   Report Status PENDING   Incomplete   CULTURE, BLOOD (ROUTINE X 2)     Status: Normal (Preliminary result)   Collection Time   12/25/10  6:20 PM      Component Value Range Status Comment   Specimen Description BLOOD RIGHT HAND   Final    Special Requests BOTTLES DRAWN AEROBIC ONLY 10CC   Final    Setup Time [id number]   Final    Culture     Final    Value:        BLOOD CULTURE RECEIVED NO GROWTH TO DATE CULTURE WILL BE HELD FOR 5 DAYS BEFORE ISSUING A FINAL NEGATIVE REPORT   Report Status PENDING   Incomplete   CLOSTRIDIUM DIFFICILE BY PCR     Status: Normal   Collection Time   12/25/10  9:54 PM      Component Value Range Status Comment   C difficile by pcr NEGATIVE  NEGATIVE  Final     Studies/Results: Scheduled Meds:    . amLODipine  10 mg Per Tube QHS  . antiseptic oral rinse  1 application Mouth Rinse QID  . darbepoetin (ARANESP) injection - DIALYSIS  60 mcg Intravenous Q Thu-HD  . dextrose      . feeding supplement  1 Container Oral TID BM  . imipenem-cilastatin  250 mg Intravenous Q12H  . levalbuterol  0.63 mg Nebulization QID  . metoprolol tartrate  25 mg Oral Q6H  . pantoprazole sodium  40 mg Oral Q24H  . paricalcitol      . paricalcitol  2 mcg Intravenous Q T,Th,Sa-HD  . vancomycin  750 mg Intravenous Q T,Th,Sa-HD  . DISCONTD: chlorhexidine  15 mL Mouth/Throat BID  . DISCONTD: pantoprazole sodium  40 mg Per Tube Q24H   Continuous Infusions:    . dextrose 40 mL/hr at 12/30/10 0600  . DISCONTD: sodium  chloride Stopped (12/26/10 0939)   PRN Meds:.acetaminophen, camphor-menthol, dextrose, heparin, heparin, hydrALAZINE, levalbuterol, loperamide, traMADol  Assessment/Plan:  VDRF in setting of cardiac arrest extubated 12/7 with no plan for reintubation  Cardiac arrest with presumed VF with hx of systolic CHF and post-dialysis hypokalemia    Slurred speech and mild right hand weakness- this is a change from when I  evaluated him 2 days ago. Will obtain a CT head. As symptoms have been present for > 3hrs, if this is a CVA, he is not a TPA candidate.  I am concerned that he may aspirate as well. I have changed the clear liquids to honey thick and will request an SLP eval.   Purulent bronchitis with ?PNA (hospital acquired) - Vanc and Primaxin started on 12/6. No growth on sputum culture.  CXR today: 1. Removal of ET and NG tubes.  2. Persistent central vascular congestion and patchy infiltrates.  3. Probable left pleural effusion.   Diarrhea Likely due to antibiotics-C. difficile negative-add agents to assist in adding bulk  R distal middle finger traumatic injury Has been seen by hand surgeon-continue their recommendations for care  Shock:  Resolved Per family discussion no further escalation of care, CPR, cardioversion or pressors  Hypokalemia Resolved  Thrombocytopenia Resolved  HTN Well-controlled at present  Acute on chronic systolic CHF Volume being handled via dialysis  ESRD  HD per renal on his usual T,Th,Sat schedule  Anoxic encephalopathy:  Appears resolved as he is quite alert. With the change in speech, it is difficult to understand him and therefore difficult to tell if his is well oriented. He continues to follow commands well.   Anemia  Hemoglobin is holding steady  CODE STATUS: DNR   DISPOSITION No need for swallow evaluation, patient is not to be reintubated regardless so will allow to have clear liquid and advance for comfort as tolerated.   PT/OT        LOS: 17 days   Domingos Riggi 12/30/2010, 2:30 PM

## 2010-12-30 NOTE — Plan of Care (Signed)
Outpatient dialysis plan of care reviewed. °

## 2010-12-31 ENCOUNTER — Inpatient Hospital Stay (HOSPITAL_COMMUNITY): Payer: Medicare Other

## 2010-12-31 LAB — GLUCOSE, CAPILLARY
Glucose-Capillary: 82 mg/dL (ref 70–99)
Glucose-Capillary: 69 mg/dL — ABNORMAL LOW (ref 70–99)
Glucose-Capillary: 87 mg/dL (ref 70–99)

## 2010-12-31 MED ORDER — DEXTROSE 50 % IV SOLN
INTRAVENOUS | Status: AC
  Administered 2010-12-31: 25 mL
  Filled 2010-12-31: qty 50

## 2010-12-31 MED ORDER — HEPARIN SODIUM (PORCINE) 1000 UNIT/ML DIALYSIS
1000.0000 [IU] | INTRAMUSCULAR | Status: DC | PRN
  Filled 2010-12-31: qty 1

## 2010-12-31 MED ORDER — PENTAFLUOROPROP-TETRAFLUOROETH EX AERO: 1.0000 "application " | INHALATION_SPRAY | CUTANEOUS | Status: DC | PRN

## 2010-12-31 MED ORDER — STARCH (THICKENING) PO POWD
ORAL | Status: DC | PRN
  Filled 2010-12-31: qty 227

## 2010-12-31 MED ORDER — SODIUM CHLORIDE 0.9 % IV SOLN: 100.0000 mL | INTRAVENOUS | Status: DC | PRN

## 2010-12-31 MED ORDER — HEPARIN SODIUM (PORCINE) 1000 UNIT/ML DIALYSIS
20.0000 [IU]/kg | INTRAMUSCULAR | Status: DC | PRN
  Filled 2010-12-31: qty 2

## 2010-12-31 MED ORDER — HEPARIN SODIUM (PORCINE) 1000 UNIT/ML DIALYSIS: 1000.0000 [IU] | INTRAMUSCULAR | Status: DC | PRN

## 2010-12-31 MED ORDER — LIDOCAINE-PRILOCAINE 2.5-2.5 % EX CREA: 1.0000 "application " | TOPICAL_CREAM | CUTANEOUS | Status: DC | PRN

## 2010-12-31 MED ORDER — HEPARIN SODIUM (PORCINE) 1000 UNIT/ML DIALYSIS: 20.0000 [IU]/kg | INTRAMUSCULAR | Status: DC | PRN

## 2010-12-31 MED ORDER — ALTEPLASE 2 MG IJ SOLR
2.0000 mg | Freq: Once | INTRAMUSCULAR | Status: AC | PRN
  Filled 2010-12-31: qty 2

## 2010-12-31 MED ORDER — NEPRO/CARBSTEADY PO LIQD
237.0000 mL | ORAL | Status: DC | PRN
  Administered 2010-12-31: 237 mL via ORAL
  Filled 2010-12-31: qty 237

## 2010-12-31 MED ORDER — ALTEPLASE 2 MG IJ SOLR: 2.0000 mg | Freq: Once | INTRAMUSCULAR | Status: DC | PRN

## 2010-12-31 MED ORDER — LIDOCAINE HCL (PF) 1 % IJ SOLN: 5.0000 mL | INTRAMUSCULAR | Status: DC | PRN

## 2010-12-31 MED ORDER — PRO-STAT SUGAR FREE PO LIQD
30.0000 mL | Freq: Three times a day (TID) | ORAL | Status: DC
  Administered 2010-12-31 – 2011-01-02 (×2): 30 mL via ORAL
  Filled 2010-12-31 (×11): qty 30

## 2010-12-31 MED ORDER — LIDOCAINE HCL (PF) 1 % IJ SOLN
5.0000 mL | INTRAMUSCULAR | Status: DC | PRN
  Filled 2010-12-31: qty 5

## 2010-12-31 MED ORDER — LIDOCAINE-PRILOCAINE 2.5-2.5 % EX CREA
1.0000 "application " | TOPICAL_CREAM | CUTANEOUS | Status: DC | PRN
  Filled 2010-12-31: qty 5

## 2010-12-31 MED ORDER — NEPRO/CARBSTEADY PO LIQD: 237.0000 mL | ORAL | Status: DC | PRN

## 2010-12-31 NOTE — Progress Notes (Signed)
Speech Language/Pathology Clinical/Bedside Swallow Evaluation Patient Details  Name: Jackson [name] MRN: [id number] DOB: [birth year] Today's Date: 12/31/2010  Past Medical History:  Past Medical History  Diagnosis Date  . Chronic systolic heart failure 09/06/2009  . ESRD on hemodialysis     Tues, Thurs, Sat  . Cardiomyopathy   . HTN (hypertension)   . Hyperlipemia   . Anemia     Multifactorial - iron deficiency and anemia of chronic disease in setting of ESRD. Last anemia panel (07/2005 ) - Fe 16, TIBC  203, Ferritin 350.  Marland Kitchen Gout   . Tricuspid regurgitation   . Thrombus     H/o apical thrombus  . Cardiac arrest 12/09/2007    with pulseless electrical activity 2/2 severe hyperkalemia (K > 7.5)  . Thyroid disease   . COPD (chronic obstructive pulmonary disease)   . GERD (gastroesophageal reflux disease)   . Cancer     prostate  . Rhabdomyolysis     H/O - secondary to blunt trauma to the buttock   . Secondary hyperparathyroidism   . 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 Chronic idiopathic thrombocytopenia     BL platelet 100-120.  Marland Kitchen Syncope     H/O multiple syncopal episodes thought to be vasovagal versus hypotension induced. Last episode  10/2009   Past Surgical History:  Past Surgical History  Procedure Date  . Hernia repair   . Av fistula placement   . Jugular catheter placement   . 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 Right common iliac artery aneurysm repair 10/2009    Assessment/Recommendations/Treatment Plan  SLP Assessment Clinical Impression Statement: Patient presents with no overt s/s of aspiration at bedside however indication of dysphagia noted as evidenced by oral motor weakness, delayed swallow initiation and multiple swallows primarily with thin liquids. Loud upper airway noise (wet) noted however voice hoarse with no overt wet vocal quality noted.  Given prolongued intubation, advanced age, and  s/s of dysphagia at bedside, recommend very conservative diet and MBS 12/13 to objectively evaluate swallowing function.   Risk for Aspiration: Severe Other Related Risk Factors: Decreased respiratory status (prolongued intubation)  Recommendations Recommended Consults: MBS Solid Consistency: Dysphagia 1 (Puree) Liquid Consistency: Honey Liquid Administration via: Cup;No straw Medication Administration: Crushed with puree Supervision: Full supervision/cueing for compensatory strategies Compensations: Slow rate;Small sips/bites Postural Changes and/or Swallow Maneuvers: Seated upright 90 degrees;Upright 30-60 min after meal Oral Care Recommendations: Oral care BID Other Recommendations: Order thickener from pharmacy;Prohibited food (jello, ice cream, thin soups);Remove water pitcher     Swallow Study Goals   Pending MBS  Swallow Study     General  Date of Onset: 11/26/2010 Other Pertinent Information: patient admitted with VDRF in the setting of cardiac arrest requiring CPR, intubation x 13 days. Also with bronchitis and ? HAP, anoxic encephalopahty. Patient placed on a dysphagia 3 diet with honey thick liquids and bedside swallow evaluation ordered. Dr. Sharon Seller confirmed need for swallow evaluation to determine least restrictive and safest diet at this time.  Type of Study: Bedside swallow evaluation Diet Prior to this Study: Dysphagia 3 (soft);Honey-thick liquids (? regular diet PTA) Respiratory Status: Supplemental O2 delivered via (comment) (nasal cannula ) History of Intubation: Yes Length of Intubations (days): 13 days Date extubated: 12/26/10 Behavior/Cognition: Alert;Confused;Requires cueing;Distractible Oral Cavity - Dentition: Missing dentition;Poor condition Vision: Functional for self-feeding Patient Positioning: Upright in bed Baseline Vocal Quality: Hoarse (wet upper airway noise. Voice  hoarse but not wet sounding) Volitional Cough: Weak;Congested Volitional Swallow:  Able to elicit Ice chips: Not tested  Oral Motor/Sensory Function  Labial ROM: Reduced right;Reduced left Labial Symmetry: Within Functional Limits Labial Strength: Reduced Labial Sensation: Within Functional Limits Lingual ROM: Reduced right;Reduced left Lingual Symmetry: Within Functional Limits Lingual Strength: Reduced Lingual Sensation: Within Functional Limits Facial ROM: Reduced right;Reduced left Facial Symmetry: Within Functional Limits Facial Strength: Reduced Velum: Within Functional Limits Mandible: Within Functional Limits  Consistency Results  Ice Chips Ice chips: Not tested  Thin Liquid Thin Liquid: Impaired Presentation: Cup Oral Phase Impairments: Reduced labial seal Oral Phase Functional Implications: Right anterior spillage Pharyngeal  Phase Impairments: Delayed Swallow;Multiple swallows  Nectar Thick Liquid Nectar Thick Liquid: Not tested  Honey Thick Liquid Honey Thick Liquid: Impaired Presentation: Cup Pharyngeal Phase Impairments: Delayed Swallow  Puree Puree: Impaired Presentation: Spoon Oral Phase Impairments: Reduced lingual movement/coordination Oral Phase Functional Implications: Other (comment) (delayed oral transit, mild)  Solid Solid: Impaired Presentation: Self Fed Oral Phase Impairments: Reduced lingual movement/coordination Oral Phase Functional Implications: Other (comment) (delayed oral transit, mod with increased mastication)  Jackson Hopkins MA, CCC-SLP [phone]  Jackson [name] Jackson [name] 12/31/2010,10:17 AM

## 2010-12-31 NOTE — Progress Notes (Signed)
This patient was discussed at long LOS rounds this morning. 

## 2010-12-31 NOTE — Progress Notes (Signed)
Subjective: 77 y/o AAM with ESRD on HD who had dialysis the morning of his admission and had syncope at home afterwards. According to EMS, the patient had an AED applied by the fire department who responded first which advised a shockable rhythm. He was shocked one time. EMS gave him 1 mg of epinephrine. He was intubated orally at the scene. He also had an EJ established at the scene as well. Sinus rhythm with multiple premature beats was established with palpable blood pressure. He was initially admitted to the critical care service due to his ventilator dependent status. He was transferred to [facility] on December 9.  Today he is alert and interactive. He does appear to be somewhat confused. He can tell me where he is but does not know why he is here and cannot name the date. He does answer numerous other questions appropriately. Though his speech is slow and intentional there is no evidence of slurred speech to my exam today.  Objective: Weight change: -3.5 kg (-7 lb 11.5 oz)  Intake/Output Summary (Last 24 hours) at 12/31/10 1516 Last data filed at 12/31/10 1400  Gross per 24 hour  Intake   1180 ml  Output      0 ml  Net   1180 ml   Blood pressure 109/61, pulse 84, temperature 97.5 F (36.4 C), temperature source Oral, resp. rate 26, height 5\' 11"  (1.803 m), weight 65.7 kg (144 lb 13.5 oz), SpO2 96.00%. Temp:  [97.5 F (36.4 C)-99.8 F (37.7 C)] 97.5 F (36.4 C) (12/12 1236) Pulse Rate:  [80-89] 84  (12/12 1507) Resp:  [20-26] 26  (12/12 1507) BP: (101-132)/(61-84) 109/61 mmHg (12/12 1236) SpO2:  [93 %-97 %] 96 % (12/12 1236)  Physical Exam: General: No acute respiratory distress Lungs: Clear to auscultation bilaterally without wheezes or crackles with exception to mild bibasilar crackles Cardiovascular: Regular rate and rhythm without murmur gallop or rub normal S1 and S2 Abdomen: Nontender, nondistended, soft, bowel sounds positive, no rebound, no ascites, no  appreciable mass Extremities: No significant cyanosis, clubbing, or edema bilateral lower extremities-right middle finger is currently dressed and dry Neuro- alert but confused as noted above-no slurring of speech appreciable my exam today-clumsiness appreciated with attempts at fine motor control of both hands   Lab Results:  Basename 12/30/10 0730  NA 131*  K 4.1  CL 95*  CO2 23  GLUCOSE 73  BUN 28*  CREATININE 5.68*  CALCIUM 8.4  MG --  PHOS 4.4    Basename 12/30/10 0730  AST --  ALT --  ALKPHOS --  BILITOT --  PROT --  ALBUMIN 1.9*   No results found for this basename: LIPASE:2,AMYLASE:2 in the last 72 hours  Basename 12/30/10 0730  WBC 5.0  NEUTROABS --  HGB 9.5*  HCT 28.8*  MCV 86.2  PLT 207   No results found for this basename: CKTOTAL:3,CKMB:3,CKMBINDEX:3,TROPONINI:3 in the last 72 hours No components found with this basename: POCBNP:3 No results found for this basename: DDIMER:2 in the last 72 hours No results found for this basename: HGBA1C:12 in the last 72 hours No results found for this basename: CHOL:2,HDL:2,LDLCALC:2,TRIG:2,CHOLHDL:2,LDLDIRECT:2 in the last 72 hours No results found for this basename: TSH,T4TOTAL,FREET3,T3FREE,THYROIDAB in the last 72 hours No results found for this basename: VITAMINB12:2,FOLATE:2,FERRITIN:2,TIBC:2,IRON:2,RETICCTPCT:2 in the last 72 hours  Micro Results: Recent Results (from the past 240 hour(s))  CULTURE, RESPIRATORY     Status: Normal   Collection Time   12/25/10  5:35 PM  Component Value Range Status Comment   Specimen Description TRACHEAL ASPIRATE   Final    Special Requests NONE   Final    Gram Stain     Final    Value: RARE WBC PRESENT, PREDOMINANTLY PMN     NO SQUAMOUS EPITHELIAL CELLS SEEN     NO ORGANISMS SEEN   Culture NO GROWTH 2 DAYS   Final    Report Status 12/28/2010 FINAL   Final   CULTURE, BLOOD (ROUTINE X 2)     Status: Normal (Preliminary result)   Collection Time   12/25/10  5:48 PM       Component Value Range Status Comment   Specimen Description BLOOD RIGHT HAND   Final    Special Requests BOTTLES DRAWN AEROBIC AND ANAEROBIC 10CC EACH   Final    Setup Time [id number]   Final    Culture     Final    Value:        BLOOD CULTURE RECEIVED NO GROWTH TO DATE CULTURE WILL BE HELD FOR 5 DAYS BEFORE ISSUING A FINAL NEGATIVE REPORT   Report Status PENDING   Incomplete   CULTURE, BLOOD (ROUTINE X 2)     Status: Normal (Preliminary result)   Collection Time   12/25/10  6:20 PM      Component Value Range Status Comment   Specimen Description BLOOD RIGHT HAND   Final    Special Requests BOTTLES DRAWN AEROBIC ONLY 10CC   Final    Setup Time [id number]   Final    Culture     Final    Value:        BLOOD CULTURE RECEIVED NO GROWTH TO DATE CULTURE WILL BE HELD FOR 5 DAYS BEFORE ISSUING A FINAL NEGATIVE REPORT   Report Status PENDING   Incomplete   CLOSTRIDIUM DIFFICILE BY PCR     Status: Normal   Collection Time   12/25/10  9:54 PM      Component Value Range Status Comment   C difficile by pcr NEGATIVE  NEGATIVE  Final     Studies/Results: Scheduled Meds:    . amLODipine  10 mg Per Tube QHS  . antiseptic oral rinse  1 application Mouth Rinse QID  . darbepoetin (ARANESP) injection - DIALYSIS  60 mcg Intravenous Q Thu-HD  . dextrose      . feeding supplement  1 Container Oral TID BM  . feeding supplement  30 mL Oral TID WC  . imipenem-cilastatin  250 mg Intravenous Q12H  . levalbuterol  0.63 mg Nebulization QID  . metoprolol tartrate  25 mg Oral Q6H  . pantoprazole sodium  40 mg Oral Q24H  . paricalcitol  2 mcg Intravenous Q T,Th,Sa-HD  . vancomycin  750 mg Intravenous Q T,Th,Sa-HD   Continuous Infusions:    . dextrose 40 mL/hr at 12/30/10 1602   PRN Meds:.sodium chloride, sodium chloride, acetaminophen, alteplase, camphor-menthol, feeding supplement (NEPRO CARB STEADY), food thickener, heparin, heparin, heparin, hydrALAZINE, levalbuterol, lidocaine,  lidocaine-prilocaine, loperamide, pentafluoroprop-tetrafluoroeth, traMADol, DISCONTD: sodium chloride, DISCONTD: sodium chloride, DISCONTD: alteplase, DISCONTD: feeding supplement (NEPRO CARB STEADY), DISCONTD: heparin, DISCONTD: heparin DISCONTD: heparin, DISCONTD: lidocaine, DISCONTD: lidocaine-prilocaine, DISCONTD: pentafluoroprop-tetrafluoroeth  Assessment/Plan:  VDRF in setting of cardiac arrest extubated 12/7 with no plan for reintubation  presumed VF leading to cardiac arrest (hx of systolic CHF and post-dialysis hypokalemia)  Slurred speech Symptoms were first reported approximately 48 hours ago-there does appear to be a waxing and waning component as the patient is not serious speech  during my exam today-an MRI revealed no evidence of acute CVA- perhaps this is related to diffuse anoxic encephalopathy in the setting of his cardiac arrest  Purulent bronchitis with ?PNA (hospital acquired) - Vanc and Primaxin started on 12/6. No growth on sputum culture.  Diarrhea Likely due to antibiotics-C. difficile negative-add agents to assist in adding bulk  R distal middle finger traumatic injury Wound care consult requested by nephrology is not necessary in that patient has been seen by hand surgeon-continue the recommendations from the hand surgeon for care  Shock:  Resolved Per family discussion no further escalation of care, CPR, cardioversion or pressors  Hypokalemia Resolved  Thrombocytopenia Resolved  HTN Well-controlled at present  Acute on chronic systolic CHF Volume being handled via dialysis  ESRD  HD per renal on his usual T,Th,Sat schedule  Anoxic encephalopathy:  His mental status appears to be waxing and waning-this may be his new baseline mental status-we will continue to follow  Anemia  Hemoglobin is holding steady  CODE STATUS: DNR   DISPOSITION Patient will clearly need a skilled nursing facility bed-PT and OT to continue-at this point I feel that he  is medically stable for transfer to a MedSurg bed   LOS: 18 days   Hibo Blasdell T 12/31/2010, 3:16 PM

## 2010-12-31 NOTE — Plan of Care (Signed)
Problem: Phase II Progression Outcomes Goal: Other Phase II Outcomes/Goals Outcome: Completed/Met Date Met:  12/31/10 Bedside swallow eval complete

## 2010-12-31 NOTE — Progress Notes (Signed)
Physical Therapy Treatment Patient Details Name: Jackson [name] MRN: [id number] DOB: Mar 08, 1928 Today's Date: 12/31/2010  PT Assessment/Plan  PT - Assessment/Plan Comments on Treatment Session: The patient is progressing well.  Attempted to use RW for standing today, but was unsuccessful in using it for the transfer.   PT Plan: Discharge plan remains appropriate PT Frequency: Min 3X/week Follow Up Recommendations: Skilled nursing facility Equipment Recommended: Defer to next venue PT Goals  Acute Rehab PT Goals PT Goal: Supine/Side to Sit - Progress: Progressing toward goal PT Goal: Sit at [facility] Of Bed - Progress: Progressing toward goal PT Goal: Sit to Stand - Progress: Progressing toward goal PT Goal: Stand to Sit - Progress: Progressing toward goal PT Transfer Goal: Bed to Chair/Chair to Bed - Progress: Progressing toward goal  PT Treatment Precautions/Restrictions  Precautions Precautions: Fall Restrictions Weight Bearing Restrictions: No Mobility (including Balance) Bed Mobility Supine to Sit: With rails;HOB elevated (Comment degrees);1: +2 Total assist;Patient percentage (comment) (HOB 40 degrees) Supine to Sit Details (indicate cue type and reason): Patient 50% pulling with his right arm on bedrail to try to get to sitting.  The pateint needed assist of both legs, hips and trunk to get to upright sititing.   Transfers Sit to Stand: 1: +2 Total assist;Patient percentage (comment);From bed;From elevated surface;With upper extremity assist Sit to Stand Details (indicate cue type and reason): Patient needed assist to help support trunk over weak legs and to anterior weight shift in standing.  Attmepted to stand x2 with RW.  Patient is pushing RW away with extended elbows instead of down on handles.   Stand to Sit: 1: +2 Total assist;Patient percentage (comment);With upper extremity assist;To elevated surface;To bed;To chair/3-in-1 Stand to Sit Details: x2 to bed and x1 to  recliner chair.  Patient 60% with continued posterior lean and uncontrolled descent.  Assist needed to help control descent to sit and to make sure that the patient hit his sitting target.   Stand Pivot Transfers: 1: +2 Total assist;Patient percentage (comment);From elevated surface;With armrests Stand Pivot Transfer Details (indicate cue type and reason): Patient 60% from elevated bed to chair on the patient's right side.  We did not use RW secondary to poor use in standing trials.  Pateint with posterior lean during transfer and legs sliding out from under him.   Ambulation/Gait Ambulation/Gait: No (the patient is not ready for gait yet.  )  Static Sitting Balance Static Sitting - Balance Support: Left upper extremity supported;Feet supported Static Sitting - Level of Assistance: 5: Stand by assistance Static Sitting - Comment/# of Minutes: close supervision needed for safety, but patient able to support himself once sitting where feet are supported in midline.   Exercise    End of Session PT - End of Session Equipment Utilized During Treatment: Gait belt (RW) Activity Tolerance: Patient tolerated treatment well;Patient limited by fatigue Patient left: in chair;with call bell in reach;with bed alarm set General Behavior During Session: [facility] for tasks performed Cognition: Impaired Cognitive Impairment: The patient has expressive difficulties, was able to follow 75% of one step commands and is generally impulsive and quick to move.  He has limited sustained attention.   Rollene Rotunda Dempsey Ahonen, PT, DPT [phone]   12/31/2010, 12:28 PM

## 2010-12-31 NOTE — Progress Notes (Addendum)
Nutrition Follow-up  Extubated on 12/7. TF discontinued with extubation. MBSS completed 12/12 recommending Dysphagia 1 diet with Honey liquids. Currently on this diet. Eating 5%.  Diet Order:  Dysphagia 1 diet with Honey Thick liquids Supplements: Nepro prn with HD and Ensure Pudding TID  Meds: Scheduled Meds:   . amLODipine  10 mg Per Tube QHS  . antiseptic oral rinse  1 application Mouth Rinse QID  . darbepoetin (ARANESP) injection - DIALYSIS  60 mcg Intravenous Q Thu-HD  . dextrose      . feeding supplement  1 Container Oral TID BM  . imipenem-cilastatin  250 mg Intravenous Q12H  . levalbuterol  0.63 mg Nebulization QID  . metoprolol tartrate  25 mg Oral Q6H  . pantoprazole sodium  40 mg Oral Q24H  . paricalcitol  2 mcg Intravenous Q T,Th,Sa-HD  . vancomycin  750 mg Intravenous Q T,Th,Sa-HD  . DISCONTD: chlorhexidine  15 mL Mouth/Throat BID   Continuous Infusions:   . dextrose 40 mL/hr at 12/30/10 1602   PRN Meds:.sodium chloride, sodium chloride, acetaminophen, alteplase, camphor-menthol, feeding supplement (NEPRO CARB STEADY), food thickener, heparin, heparin, heparin, hydrALAZINE, levalbuterol, lidocaine, lidocaine-prilocaine, loperamide, pentafluoroprop-tetrafluoroeth, traMADol, DISCONTD: sodium chloride, DISCONTD: sodium chloride, DISCONTD: alteplase, DISCONTD: feeding supplement (NEPRO CARB STEADY), DISCONTD: heparin, DISCONTD: heparin DISCONTD: heparin, DISCONTD: lidocaine, DISCONTD: lidocaine-prilocaine, DISCONTD: pentafluoroprop-tetrafluoroeth  Labs:  CMP     Component Value Date/Time   NA 131* 12/30/2010 0730   K 4.1 12/30/2010 0730   CL 95* 12/30/2010 0730   CO2 23 12/30/2010 0730   GLUCOSE 73 12/30/2010 0730   BUN 28* 12/30/2010 0730   CREATININE 5.68* 12/30/2010 0730   CALCIUM 8.4 12/30/2010 0730   PROT 5.1* 12/16/2010 0700   ALBUMIN 1.9* 12/30/2010 0730   AST 27 12/16/2010 0700   ALT 22 12/16/2010 0700   ALKPHOS 137* 12/16/2010 0700   BILITOT 0.5  12/16/2010 0700   GFRNONAA 8* 12/30/2010 0730   GFRAA 10* 12/30/2010 0730  Phosphorus 4.4   Intake/Output Summary (Last 24 hours) at 12/31/10 1013 Last data filed at 12/31/10 0700  Gross per 24 hour  Intake   1300 ml  Output   3500 ml  Net  -2200 ml    Weight Status:  65.7 kg, wt trending down  Nutrition Dx: Inadequate oral intake now r/t poor appetite AEB documented poor meal completion.  Goal:  TF to meet 90-100% of estimated needs. Discontinued.  New Goal: Pt to consume >75% of meals and supplements on average  Intervention:   1. 30ml Prostat TID mixed in applesauce 2. Encourage Nepro (thickened to appropriate consistency) and Ensure Pudding 3. RD to follow nutrition care plan  Monitor:  PO intake, weights, labs  Adair Laundry Pager #:  [phone]

## 2010-12-31 NOTE — Consult Note (Signed)
WOC consult Note Reason for Consult: Consult requested for right 2nd finger wound Wound type: Full thickness, 2X2X.1cm, no nailbed present.  Woundbed 90% red, 10% dark red at fingertip.  Unknown source of what appears to be a traumatic injury. Pt frequently puts hands into stool according to nurses. Drainage (amount, consistency, odor) Small pink drainage, no odor. Periwound: Appears to have previous area of blistering which has peeled.  Dressing procedure/placement/frequency: Pt seen by Dr Mina Marble, hand surgery for assessment and plan of care, which has been ordered with xeroform with plans to follow after discharge, according to progress notes.  Will not plan to follow further unless re-consulted.  [address] RN, MSN, [employer]  [phone]

## 2010-12-31 NOTE — Progress Notes (Signed)
Clinical social worker provided bed offers to patient daughter via email. Pt email address is [email or web address]. CSW to follow up with patient choice of facility. Pt daughter is hoping to visit facilities on Friday when patient daughter is off work and able to do so. CSW continuing to follow to assist with patient dc plans.   Catha Gosselin, Theresia Majors  [phone] 12/31/2010 17:24pm

## 2010-12-31 NOTE — Progress Notes (Signed)
Clinical social work left message for patient daughter to discuss patient dc plans and bed offers. CSW continuing to follow to assist with pt dc plans.   Catha Gosselin, Theresia Majors  [phone] .12/31/2010  13:11pm

## 2010-12-31 NOTE — Progress Notes (Signed)
Subjective: Interval History: s/p MRI brain with no acute changes noted.  Objective: Vital signs in last 24 hours:  Temp:  [98.2 F (36.8 C)-99.8 F (37.7 C)] 98.9 F (37.2 C) (12/12 0400) Pulse Rate:  [73-94] 86  (12/12 0400) Resp:  [17-25] 23  (12/12 0000) BP: (100-139)/(61-84) 110/62 mmHg (12/12 0400) SpO2:  [88 %-100 %] 93 % (12/12 0400) Weight:  [65.7 kg (144 lb 13.5 oz)] 144 lb 13.5 oz (65.7 kg) (12/11 1105)  Weight change: -3.5 kg (-7 lb 11.5 oz)  Intake/Output: I/O last 3 completed shifts: In: 1800 [P.O.:380; I.V.:1120; IV Piggyback:300] Out: 3501 [Other:3500; Stool:1]   Intake/Output this shift:     General: No acute respiratory distress  Lungs: Clear to auscultation bilaterally without wheezes or crackles Cardiovascular: Regular rate and rhythm without murmur gallop or rub normal S1 and S2 Perm Cath IJ Abdomen: Nontender, nondistended, soft, bowel sounds positive, no rebound, no ascites, no appreciable mass  Extremities: No significant cyanosis, clubbing, or edema bilateral lower extremities--right middle finger is currently dressed and dry   Lab Results:  Basename 12/30/10 0730  WBC 5.0  HGB 9.5*  HCT 28.8*  PLT 207   BMET  Basename 12/30/10 0730  NA 131*  K 4.1  CL 95*  CO2 23  GLUCOSE 73  BUN 28*  CREATININE 5.68*  CALCIUM 8.4  PHOS 4.4   LFT  Basename 12/30/10 0730  PROT --  ALBUMIN 1.9*  AST --  ALT --  ALKPHOS --  BILITOT --  BILIDIR --  IBILI --   PT/INR No results found for this basename: LABPROT:2,INR:2 in the last 72 hours Hepatitis Panel No results found for this basename: HEPBSAG,HCVAB,HEPAIGM,HEPBIGM in the last 72 hours  Studies/Results: Mr Brain Wo Contrast  12/30/2010  *RADIOLOGY REPORT*  Clinical Data: Slurred speech.  Renal failure.  Syncope.  MRI HEAD WITHOUT CONTRAST  Technique:  Multiplanar, multiecho pulse sequences of the brain and surrounding structures were obtained according to standard protocol without  intravenous contrast.  Comparison: CT 12/09/2010  Findings: Diffusion imaging does not show any acute or subacute infarction.  There is advanced generalized brain atrophy.  There are old small vessel infarctions within the cerebellum and there is extensive chronic small vessel disease throughout the cerebral hemispheres including the thalami, basal ganglia and hemispheric white matter.  No evidence of mass lesion, acute hemorrhage, hydrocephalus or extra-axial collection.  There are scattered punctate foci of hemosiderin deposition consistent with old small vessel infarctions.  No pituitary mass.  There are inflammatory changes of the sphenoid sinus.  IMPRESSION: No acute or reversible findings.  Advanced generalized brain atrophy and chronic small vessel disease throughout the brain.  Inflammatory changes of the sphenoid sinus.  Original Report Authenticated By: Thomasenia Sales, M.D.   Dg Chest Port 1 View  12/30/2010  *RADIOLOGY REPORT*  Clinical Data: Shortness of breath.  PORTABLE CHEST - 1 VIEW  Comparison: Chest x-ray 12/26/2010.  Findings: The endotracheal  and NG tubes have been removed.  The right IJ catheter is stable.  The left subclavian central venous catheter is stable.  There are persistent bilateral infiltrates and a left pleural effusion.  Persistent central vascular congestion. Probable left pleural effusion.  IMPRESSION:  1. 1.  Removal of ET and NG tubes. 2.  Persistent central vascular congestion and patchy infiltrates. 3.  Probable left pleural effusion.  Original Report Authenticated By: P. Loralie Champagne, M.D.    I have reviewed the patient's current medications.  Assessment/Plan:  ESRD- TTS  dialysis plan in AM  ANEMIA-Aranesp  MBD- IV Vitamin D  HTN/VOL- appears stable with Norvasc  ACCESS Perm Catheter  OTHER- R middle finger consult wound care    VDRF in setting of cardiac arrest, shock with no plans to escalate care. Anoxic encephalopathy. Will plan dialysis in the  AM.        LOS: 18 Bader Stubblefield W @TODAY @7 :08 AM

## 2011-01-01 ENCOUNTER — Inpatient Hospital Stay (HOSPITAL_COMMUNITY): Payer: Medicare Other

## 2011-01-01 LAB — CULTURE, BLOOD (ROUTINE X 2)
Culture  Setup Time: 201212070030
Culture: NO GROWTH

## 2011-01-01 LAB — GLUCOSE, CAPILLARY
Glucose-Capillary: 90 mg/dL (ref 70–99)
Glucose-Capillary: 43 mg/dL — CL (ref 70–99)
Glucose-Capillary: 99 mg/dL (ref 70–99)
Glucose-Capillary: 83 mg/dL (ref 70–99)
Glucose-Capillary: 74 mg/dL (ref 70–99)

## 2011-01-01 LAB — CBC
Hemoglobin: 9.1 g/dL — ABNORMAL LOW (ref 13.0–17.0)
MCH: 28.7 pg (ref 26.0–34.0)
Platelets: 177 10*3/uL (ref 150–400)
RBC: 3.17 MIL/uL — ABNORMAL LOW (ref 4.22–5.81)
WBC: 4.8 10*3/uL (ref 4.0–10.5)

## 2011-01-01 MED ORDER — DEXTROSE 50 % IV SOLN
INTRAVENOUS | Status: AC
  Administered 2011-01-01: 25 mL
  Filled 2011-01-01: qty 50

## 2011-01-01 MED ORDER — SODIUM CHLORIDE 0.9 % IV SOLN
500.0000 mg | INTRAVENOUS | Status: DC
  Administered 2011-01-03: 500 mg via INTRAVENOUS
  Filled 2011-01-01: qty 500

## 2011-01-01 MED ORDER — PARICALCITOL 5 MCG/ML IV SOLN
INTRAVENOUS | Status: AC
  Filled 2011-01-01: qty 1

## 2011-01-01 MED ORDER — DARBEPOETIN ALFA-POLYSORBATE 60 MCG/0.3ML IJ SOLN
INTRAMUSCULAR | Status: AC
  Administered 2011-01-01: 60 ug
  Filled 2011-01-01: qty 0.3

## 2011-01-01 NOTE — Progress Notes (Signed)
Subjective: Interval History: none.  Objective: Vital signs in last 24 hours:  Temp:  [97.5 F (36.4 C)-100 F (37.8 C)] 100 F (37.8 C) (12/13 0716) Pulse Rate:  [76-96] 76  (12/13 0749) Resp:  [19-26] 23  (12/13 0749) BP: (106-151)/(58-78) 106/58 mmHg (12/13 0749) SpO2:  [92 %-97 %] 97 % (12/13 0749) Weight:  [66 kg (145 lb 8.1 oz)-69.5 kg (153 lb 3.5 oz)] 149 lb 14.6 oz (68 kg) (12/13 0716)  Weight change: 0.3 kg (10.6 oz)  Intake/Output: I/O last 3 completed shifts: In: 1320 [P.O.:280; I.V.:840; IV Piggyback:200] Out: -    Intake/Output this shift:     General: No acute respiratory distress  Lungs: Clear to auscultation bilaterally without wheezes or crackles  Cardiovascular: Regular rate and rhythm without murmur gallop or rub normal S1 and S2 Perm Cath IJ  Abdomen: Nontender, nondistended, soft, bowel sounds positive, no rebound, no ascites, no appreciable mass  Extremities: No significant cyanosis, clubbing, or edema bilateral lower extremities--right middle finger is currently dressed and dry   Lab Results:  Basename 12/30/10 0730  WBC 5.0  HGB 9.5*  HCT 28.8*  PLT 207   BMET  Basename 12/30/10 0730  NA 131*  K 4.1  CL 95*  CO2 23  GLUCOSE 73  BUN 28*  CREATININE 5.68*  CALCIUM 8.4  PHOS 4.4   LFT  Basename 12/30/10 0730  PROT --  ALBUMIN 1.9*  AST --  ALT --  ALKPHOS --  BILITOT --  BILIDIR --  IBILI --   PT/INR No results found for this basename: LABPROT:2,INR:2 in the last 72 hours Hepatitis Panel No results found for this basename: HEPBSAG,HCVAB,HEPAIGM,HEPBIGM in the last 72 hours  Studies/Results: Mr Brain Wo Contrast  12/30/2010  *RADIOLOGY REPORT*  Clinical Data: Slurred speech.  Renal failure.  Syncope.  MRI HEAD WITHOUT CONTRAST  Technique:  Multiplanar, multiecho pulse sequences of the brain and surrounding structures were obtained according to standard protocol without intravenous contrast.  Comparison: CT 12/02/2010   Findings: Diffusion imaging does not show any acute or subacute infarction.  There is advanced generalized brain atrophy.  There are old small vessel infarctions within the cerebellum and there is extensive chronic small vessel disease throughout the cerebral hemispheres including the thalami, basal ganglia and hemispheric white matter.  No evidence of mass lesion, acute hemorrhage, hydrocephalus or extra-axial collection.  There are scattered punctate foci of hemosiderin deposition consistent with old small vessel infarctions.  No pituitary mass.  There are inflammatory changes of the sphenoid sinus.  IMPRESSION: No acute or reversible findings.  Advanced generalized brain atrophy and chronic small vessel disease throughout the brain.  Inflammatory changes of the sphenoid sinus.  Original Report Authenticated By: Thomasenia Sales, M.D.    I have reviewed the patient's current medications.  Assessment/Plan: ESRD- TTS dialysis plan in AM  ANEMIA-Aranesp  MBD- IV Vitamin D  HTN/VOL- appears stable with Norvasc  ACCESS Perm Catheter  OTHER- R middle finger consult wound care   VDRF in setting of cardiac arrest, shock with no plans to escalate care. Anoxic encephalopathy. Will plan dialysis in the today   LOS: 19 Mylin Gignac W @TODAY @8 :16 AM

## 2011-01-01 NOTE — Progress Notes (Signed)
Subjective: 77 y/o AAM with ESRD on HD who had dialysis the morning of his admission and had syncope at home afterwards. According to EMS, the patient had an AED applied by the fire department who responded first which advised a shockable rhythm. He was shocked one time. EMS gave him 1 mg of epinephrine. He was intubated orally at the scene. He also had an EJ established at the scene as well. Sinus rhythm with multiple premature beats was established with palpable blood pressure. He was initially admitted to the critical care service due to his ventilator dependent status. He was transferred to [facility] Team 8 on December 9.  Today he is comatose.  Objective: Weight change: 0.3 kg (10.6 oz)  Intake/Output Summary (Last 24 hours) at 01/01/11 1706 Last data filed at 01/01/11 1300  Gross per 24 hour  Intake    220 ml  Output   3650 ml  Net  -3430 ml   Blood pressure 110/59, pulse 101, temperature 97.9 F (36.6 C), temperature source Oral, resp. rate 24, height 5\' 11"  (1.803 m), weight 63.7 kg (140 lb 6.9 oz), SpO2 94.00%. Temp:  [97.9 F (36.6 C)-100 F (37.8 C)] 97.9 F (36.6 C) (12/13 1400) Pulse Rate:  [61-107] 101  (12/13 1400) Resp:  [19-34] 24  (12/13 1400) BP: (89-151)/(56-79) 110/59 mmHg (12/13 1400) SpO2:  [90 %-100 %] 94 % (12/13 1510) Weight:  [63.7 kg (140 lb 6.9 oz)-69.5 kg (153 lb 3.5 oz)] 140 lb 6.9 oz (63.7 kg) (12/13 1206)  Physical Exam: General: moderate respiratory distress Lungs: full of rhonchi  Cardiovascular: Regular rate and rhythm without murmur gallop or rub normal S1 and S2 Abdomen: Nontender, nondistended, soft, bowel sounds positive, no rebound, no ascites, no appreciable mass Extremities: No significant cyanosis, clubbing, or edema bilateral lower extremities-right middle finger is currently dressed and dry Neuro- alert but confused as noted above-no slurring of speech appreciable my exam today-clumsiness appreciated with attempts at fine motor  control of both hands   Lab Results:  Basename 12/30/10 0730  NA 131*  K 4.1  CL 95*  CO2 23  GLUCOSE 73  BUN 28*  CREATININE 5.68*  CALCIUM 8.4  MG --  PHOS 4.4    Basename 12/30/10 0730  AST --  ALT --  ALKPHOS --  BILITOT --  PROT --  ALBUMIN 1.9*   No results found for this basename: LIPASE:2,AMYLASE:2 in the last 72 hours  Basename 01/01/11 0755 12/30/10 0730  WBC 4.8 5.0  NEUTROABS -- --  HGB 9.1* 9.5*  HCT 27.5* 28.8*  MCV 86.8 86.2  PLT 177 207    Micro Results: Recent Results (from the past 240 hour(s))  CULTURE, RESPIRATORY     Status: Normal   Collection Time   12/25/10  5:35 PM      Component Value Range Status Comment   Specimen Description TRACHEAL ASPIRATE   Final    Special Requests NONE   Final    Gram Stain     Final    Value: RARE WBC PRESENT, PREDOMINANTLY PMN     NO SQUAMOUS EPITHELIAL CELLS SEEN     NO ORGANISMS SEEN   Culture NO GROWTH 2 DAYS   Final    Report Status 12/28/2010 FINAL   Final   CULTURE, BLOOD (ROUTINE X 2)     Status: Normal   Collection Time   12/25/10  5:48 PM      Component Value Range Status Comment   Specimen Description BLOOD  RIGHT HAND   Final    Special Requests BOTTLES DRAWN AEROBIC AND ANAEROBIC [facility]   Final    Setup Time [id number]   Final    Culture NO GROWTH 5 DAYS   Final    Report Status 01/01/2011 FINAL   Final   CULTURE, BLOOD (ROUTINE X 2)     Status: Normal   Collection Time   12/25/10  6:20 PM      Component Value Range Status Comment   Specimen Description BLOOD RIGHT HAND   Final    Special Requests BOTTLES DRAWN AEROBIC ONLY 10CC   Final    Setup Time [id number]   Final    Culture NO GROWTH 5 DAYS   Final    Report Status 01/01/2011 FINAL   Final   CLOSTRIDIUM DIFFICILE BY PCR     Status: Normal   Collection Time   12/25/10  9:54 PM      Component Value Range Status Comment   C difficile by pcr NEGATIVE  NEGATIVE  Final     Studies/Results: Scheduled Meds:    . amLODipine   10 mg Per Tube QHS  . antiseptic oral rinse  1 application Mouth Rinse QID  . darbepoetin      . darbepoetin (ARANESP) injection - DIALYSIS  60 mcg Intravenous Q Thu-HD  . dextrose      . feeding supplement  1 Container Oral TID BM  . feeding supplement  30 mL Oral TID WC  . imipenem-cilastatin  250 mg Intravenous Q12H  . levalbuterol  0.63 mg Nebulization QID  . metoprolol tartrate  25 mg Oral Q6H  . pantoprazole sodium  40 mg Oral Q24H  . paricalcitol      . paricalcitol  2 mcg Intravenous Q T,Th,Sa-HD  . vancomycin  500 mg Intravenous Q T,Th,Sa-HD  . DISCONTD: vancomycin  750 mg Intravenous Q T,Th,Sa-HD   Continuous Infusions:    . dextrose 40 mL/hr at 01/01/11 0404   PRN Meds:.sodium chloride, sodium chloride, acetaminophen, alteplase, camphor-menthol, feeding supplement (NEPRO CARB STEADY), food thickener, heparin, heparin, heparin, hydrALAZINE, levalbuterol, lidocaine, lidocaine-prilocaine, loperamide, pentafluoroprop-tetrafluoroeth, traMADol  Assessment/Plan: Comatose with hypoglycemia today also I am worried he aspirated again Continue D10 iv  VDRF in setting of cardiac arrest extubated 12/7 with no plan for reintubation  presumed VF leading to cardiac arrest (hx of systolic CHF and post-dialysis hypokalemia)  Slurred speech  Purulent bronchitis with ?PNA (hospital acquired) - Vanc and Primaxin started on 12/6. No growth on sputum culture.  Diarrhea Likely due to antibiotics-C. difficile negative-add agents to assist in adding bulk  R distal middle finger traumatic injury Wound care consult requested by nephrology is not necessary in that patient has been seen by hand surgeon-continue the recommendations from the hand surgeon for care  Shock:  Resolved Per family discussion no further escalation of care, CPR, cardioversion or pressors  Hypokalemia Resolved  Thrombocytopenia Resolved  HTN Well-controlled at present  Acute on chronic systolic CHF Volume  being handled via dialysis  ESRD  HD per renal on his usual T,Th,Sat schedule  Anoxic encephalopathy:  His mental status appears to be waxing and waning-this may be his new baseline mental status-we will continue to follow  Anemia  Hemoglobin is holding steady  CODE STATUS: DNR     LOS: 19 days   Nevaeh Korte 01/01/2011, 5:06 PM

## 2011-01-01 NOTE — Progress Notes (Signed)
Patient was transferred to team 6. CSW attempted to meet with patient but patient sleeping. CSW reviewed chart which stated dtr has bed offers. CSW called dtr and left a message in order to discuss bed offers and to determine if dtr has chosen a facility. CSW will continue to follow to assist with dc planning. [place], [place] [phone]

## 2011-01-01 NOTE — Plan of Care (Signed)
Problem: Phase III Progression Outcomes Goal: Mental status at or near baseline Outcome: Not Progressing Pt. Very lethargic. Goal: Tolerating diet Outcome: Not Progressing Poor appetite

## 2011-01-01 NOTE — Progress Notes (Signed)
ANTIBIOTIC CONSULT NOTE - FOLLOW UP  Pharmacy Consult for Vancomycin and Primaxin (Imipenem) Indication: 77 yo male with r/o sepsis on imipenem and vancomycin   Allergies  Allergen Reactions  . Fortaz (Ceftazidime) Itching    Patient Measurements: Height: 5\' 11"  (180.3 cm) Weight: 149 lb 14.6 oz (68 kg) IBW/kg (Calculated) : 75.3    Vital Signs: Temp: 100 F (37.8 C) (12/13 0716) Temp src: Axillary (12/13 0716) BP: 125/57 mmHg (12/13 1130) Pulse Rate: 99  (12/13 1130) Intake/Output from previous day: 12/12 0701 - 12/13 0700 In: 680 [P.O.:140; I.V.:440; IV Piggyback:100] Out: -  Intake/Output from this shift:    Labs:  Basename 01/01/11 0755 12/30/10 0730  WBC 4.8 5.0  HGB 9.1* 9.5*  PLT 177 207  LABCREA -- --  CREATININE -- 5.68*   Estimated Creatinine Clearance: 10 ml/min (by C-G formula based on Cr of 5.68).  Basename 01/01/11 0545  VANCOTROUGH --  Leodis Binet --  VANCORANDOM 27.8  GENTTROUGH --  GENTPEAK --  GENTRANDOM --  TOBRATROUGH --  TOBRAPEAK --  TOBRARND --  AMIKACINPEAK --  AMIKACINTROU --  AMIKACIN --     Microbiology: Recent Results (from the past 720 hour(s))  MRSA PCR SCREENING     Status: Normal   Collection Time   December 22, 2010  4:36 PM      Component Value Range Status Comment   MRSA by PCR NEGATIVE  NEGATIVE  Final   CULTURE, RESPIRATORY     Status: Normal   Collection Time   12/15/10 11:15 AM      Component Value Range Status Comment   Specimen Description ENDOTRACHEAL   Final    Special Requests NONE   Final    Gram Stain     Final    Value: ABUNDANT WBC PRESENT, PREDOMINANTLY PMN     NO SQUAMOUS EPITHELIAL CELLS SEEN     RARE GRAM POSITIVE COCCI IN PAIRS   Culture Non-Pathogenic Oropharyngeal-type Flora Isolated.   Final    Report Status 12/18/2010 FINAL   Final   CULTURE, BLOOD (ROUTINE X 2)     Status: Normal   Collection Time   12/16/10  1:00 PM      Component Value Range Status Comment   Specimen Description BLOOD HAND  LEFT   Final    Special Requests BOTTLES DRAWN AEROBIC ONLY 3.0 CC    Final    Setup Time [id number]   Final    Culture NO GROWTH 5 DAYS   Final    Report Status 12/22/2010 FINAL   Final   CULTURE, BLOOD (ROUTINE X 2)     Status: Normal   Collection Time   12/16/10  1:10 PM      Component Value Range Status Comment   Specimen Description BLOOD HAND LEFT   Final    Special Requests BOTTLES DRAWN AEROBIC ONLY 2.0CC   Final    Setup Time [id number]   Final    Culture NO GROWTH 5 DAYS   Final    Report Status 12/22/2010 FINAL   Final   CULTURE, RESPIRATORY     Status: Normal   Collection Time   12/25/10  5:35 PM      Component Value Range Status Comment   Specimen Description TRACHEAL ASPIRATE   Final    Special Requests NONE   Final    Gram Stain     Final    Value: RARE WBC PRESENT, PREDOMINANTLY PMN     NO SQUAMOUS EPITHELIAL CELLS SEEN  NO ORGANISMS SEEN   Culture NO GROWTH 2 DAYS   Final    Report Status 12/28/2010 FINAL   Final   CULTURE, BLOOD (ROUTINE X 2)     Status: Normal   Collection Time   12/25/10  5:48 PM      Component Value Range Status Comment   Specimen Description BLOOD RIGHT HAND   Final    Special Requests BOTTLES DRAWN AEROBIC AND ANAEROBIC 10CC EACH   Final    Setup Time [id number]   Final    Culture NO GROWTH 5 DAYS   Final    Report Status 01/01/2011 FINAL   Final   CULTURE, BLOOD (ROUTINE X 2)     Status: Normal   Collection Time   12/25/10  6:20 PM      Component Value Range Status Comment   Specimen Description BLOOD RIGHT HAND   Final    Special Requests BOTTLES DRAWN AEROBIC ONLY 10CC   Final    Setup Time [id number]   Final    Culture NO GROWTH 5 DAYS   Final    Report Status 01/01/2011 FINAL   Final   CLOSTRIDIUM DIFFICILE BY PCR     Status: Normal   Collection Time   12/25/10  9:54 PM      Component Value Range Status Comment   C difficile by pcr NEGATIVE  NEGATIVE  Final     Anti-infectives     Start     Dose/Rate Route  Frequency Ordered Stop   12/27/10 1800   vancomycin (VANCOCIN) 750 mg in sodium chloride 0.9 % 150 mL IVPB  Status:  Discontinued        750 mg 150 mL/hr over 60 Minutes Intravenous Every T-Th-Sa (Hemodialysis) 12/25/10 1621 12/25/10 1622   12/25/10 2300   metroNIDAZOLE (FLAGYL) 50 mg/ml oral suspension 500 mg  Status:  Discontinued        500 mg Per Tube 3 times daily 12/25/10 2202 12/26/10 0909   12/25/10 2145   metroNIDAZOLE (FLAGYL) tablet 500 mg  Status:  Discontinued        500 mg Oral 3 times per day 12/25/10 2142 12/25/10 2203   12/25/10 1800   vancomycin (VANCOCIN) 750 mg in sodium chloride 0.9 % 150 mL IVPB        750 mg 150 mL/hr over 60 Minutes Intravenous Every T-Th-Sa (Hemodialysis) 12/25/10 1622     12/25/10 1730   imipenem-cilastatin (PRIMAXIN) 250 mg in sodium chloride 0.9 % 100 mL IVPB        250 mg 200 mL/hr over 30 Minutes Intravenous Every 12 hours 12/25/10 1626     12/25/10 1615   vancomycin (VANCOCIN) 500 mg in sodium chloride 0.9 % 100 mL IVPB  Status:  Discontinued     Comments: Consult pharmacy to dose.      500 mg 100 mL/hr over 60 Minutes Intravenous Every 12 hours 12/25/10 1607 12/25/10 1614   12/23/10 1200   vancomycin (VANCOCIN) 750 mg in sodium chloride 0.9 % 150 mL IVPB  Status:  Discontinued        750 mg 150 mL/hr over 60 Minutes Intravenous Every T-Th-Sa (Hemodialysis) 12/22/10 1027 12/24/10 0938   12/22/10 1200   vancomycin (VANCOCIN) 750 mg in sodium chloride 0.9 % 150 mL IVPB        750 mg 150 mL/hr over 60 Minutes Intravenous  Once 12/22/10 1025 12/22/10 1340   12/18/10 1800   vancomycin (VANCOCIN) 750 mg in sodium chloride  0.9 % 150 mL IVPB        750 mg 150 mL/hr over 60 Minutes Intravenous  Once 12/18/10 1043 12/18/10 1900   12/16/10 1800   vancomycin (VANCOCIN) 750 mg in sodium chloride 0.9 % 150 mL IVPB        750 mg 150 mL/hr over 60 Minutes Intravenous  Once 12/16/10 1623 12/16/10 2221   12/15/10 1500   vancomycin (VANCOCIN) 1,500  mg in sodium chloride 0.9 % 500 mL IVPB        1,500 mg 250 mL/hr over 120 Minutes Intravenous  Once 12/15/10 1343 12/15/10 1745   12/15/10 1500   piperacillin-tazobactam (ZOSYN) IVPB 2.25 g  Status:  Discontinued        2.25 g 100 mL/hr over 30 Minutes Intravenous 3 times per day 12/15/10 1343 12/24/10 0938          Assessment: 77 yo male with r/o sepsis on Day#8  imipenem and vancomycin. Vancomycin 750mg  IV qHD (TTS) and Primaxin 250mg  IV q12h. Vancomycin preHD level this AM = 27.94mcg/ml . Goal pre-HD Vanc level = 15-25 mcg/ml. Vancomycin 750mg  dose already given in HD this AM.  Blood cultures x 2 Negative/final today.  Goal of Therapy:  pre-HD vanco level 15-25 mcg/ml    Plan:  Change Vancomycin dose to 500 mg IV after each HD (TTS) with next hemodialysis on 12/15 Saturday. Vancomycin 750 mg IV already given in Hemodialysis this AM.   Arman Filter 01/01/2011,11:55 AM

## 2011-01-02 ENCOUNTER — Inpatient Hospital Stay (HOSPITAL_COMMUNITY): Payer: Medicare Other

## 2011-01-02 LAB — GLUCOSE, CAPILLARY
Glucose-Capillary: 75 mg/dL (ref 70–99)
Glucose-Capillary: 61 mg/dL — ABNORMAL LOW (ref 70–99)

## 2011-01-02 MED ORDER — ALTEPLASE 2 MG IJ SOLR
2.0000 mg | Freq: Once | INTRAMUSCULAR | Status: AC | PRN
  Filled 2011-01-02: qty 2

## 2011-01-02 MED ORDER — PENTAFLUOROPROP-TETRAFLUOROETH EX AERO: 1.0000 "application " | INHALATION_SPRAY | CUTANEOUS | Status: DC | PRN

## 2011-01-02 MED ORDER — HEPARIN SODIUM (PORCINE) 1000 UNIT/ML DIALYSIS
20.0000 [IU]/kg | INTRAMUSCULAR | Status: DC | PRN
  Administered 2011-01-03: 1300 [IU] via INTRAVENOUS_CENTRAL
  Filled 2011-01-02: qty 2

## 2011-01-02 MED ORDER — LIDOCAINE HCL (PF) 1 % IJ SOLN
5.0000 mL | INTRAMUSCULAR | Status: DC | PRN
  Filled 2011-01-02: qty 5

## 2011-01-02 MED ORDER — SODIUM CHLORIDE 0.9 % IV SOLN: 100.0000 mL | INTRAVENOUS | Status: DC | PRN

## 2011-01-02 MED ORDER — DEXTROSE 50 % IV SOLN
INTRAVENOUS | Status: AC
  Administered 2011-01-02: 25 mL
  Filled 2011-01-02: qty 50

## 2011-01-02 MED ORDER — ACETAMINOPHEN 650 MG RE SUPP
650.0000 mg | Freq: Four times a day (QID) | RECTAL | Status: DC | PRN
  Administered 2011-01-02 – 2011-01-12 (×8): 650 mg via RECTAL
  Filled 2011-01-02 (×8): qty 1

## 2011-01-02 MED ORDER — HEPARIN SODIUM (PORCINE) 1000 UNIT/ML DIALYSIS
1000.0000 [IU] | INTRAMUSCULAR | Status: DC | PRN
  Filled 2011-01-02: qty 1

## 2011-01-02 MED ORDER — DEXTROSE 50 % IV SOLN: 25.0000 mL | Freq: Once | INTRAVENOUS | Status: AC | PRN

## 2011-01-02 MED ORDER — LEVALBUTEROL HCL 0.63 MG/3ML IN NEBU
0.6300 mg | INHALATION_SOLUTION | Freq: Four times a day (QID) | RESPIRATORY_TRACT | Status: DC
  Administered 2011-01-02 – 2011-01-06 (×14): 0.63 mg via RESPIRATORY_TRACT
  Filled 2011-01-02 (×20): qty 3

## 2011-01-02 MED ORDER — NEPRO/CARBSTEADY PO LIQD: 237.0000 mL | ORAL | Status: DC | PRN

## 2011-01-02 MED ORDER — LIDOCAINE-PRILOCAINE 2.5-2.5 % EX CREA
1.0000 "application " | TOPICAL_CREAM | CUTANEOUS | Status: DC | PRN
  Filled 2011-01-02: qty 5

## 2011-01-02 MED ORDER — BIOTENE DRY MOUTH MT LIQD
15.0000 mL | Freq: Two times a day (BID) | OROMUCOSAL | Status: DC
  Administered 2011-01-02 – 2011-01-12 (×20): 15 mL via OROMUCOSAL

## 2011-01-02 NOTE — Progress Notes (Signed)
Dtr returned CSW phone call. Dtr appreciative of call and stated that she was going to tour facilities on 12/14. CSW will continue to follow to assist with dc needs and to support family. [place], [place] [phone]

## 2011-01-02 NOTE — Progress Notes (Signed)
CBG: 61  Treatment: D50 IV 25 mL  Symptoms: Sweaty  Follow-up CBG: Time:2210 CBG Result:96   Possible Reasons for Event: Inadequate meal intake  Comments/MD notified:Lynch, MD notified and new orders were given.     Harmoney Sienkiewicz Edmon Crape Markeisha Mancias

## 2011-01-02 NOTE — Progress Notes (Signed)
Physical Therapy Treatment Patient Details Name: Jackson [name] MRN: [id number] DOB: Apr 10, 1928 Today's Date: 01/02/2011  PT Assessment/Plan  PT - Assessment/Plan Comments on Treatment Session: Pt does not appear to be progressing today. Pt with decreased cognition and ability to contribute to transfers per chart review. Uncertain of etiology of this, RN made aware.  PT Plan: Discharge plan remains appropriate PT Frequency: Min 3X/week Follow Up Recommendations: Skilled nursing facility Equipment Recommended: Defer to next venue PT Goals  Acute Rehab PT Goals PT Goal Formulation: Patient unable to participate in goal setting Time For Goal Achievement: 2 weeks Pt will Roll Supine to Left Side: with supervision;with rail PT Goal: Rolling Supine to Left Side - Progress: Not met Pt will go Supine/Side to Sit: with min assist;with rail PT Goal: Supine/Side to Sit - Progress: Not met Pt will Sit at [facility] of Bed: with modified independence;1-2 min;with bilateral upper extremity support PT Goal: Sit at Edge Of Bed - Progress: Not met Pt will go Sit to Supine/Side: with min assist PT Goal: Sit to Supine/Side - Progress: Not met Pt will go Sit to Stand: with min assist PT Goal: Sit to Stand - Progress: Not met Pt will go Stand to Sit: with min assist PT Goal: Stand to Sit - Progress: Not met Pt will Transfer Bed to Chair/Chair to Bed: with min assist PT Transfer Goal: Bed to Chair/Chair to Bed - Progress: Not met Pt will Ambulate: 1 - 15 feet;with +2 total assist;with rolling walker;Other (comment) ((pt 60%)) PT Goal: Ambulate - Progress: Not met  PT Treatment Precautions/Restrictions  Precautions Precautions: Fall Restrictions Weight Bearing Restrictions: No Mobility (including Balance) Bed Mobility Supine to Sit: HOB elevated (Comment degrees);1: +2 Total assist;Patient percentage (comment) (pt = 20%HOB 40 degrees) Transfers Transfers: Yes Sit to Stand: 1: +2 Total assist;Patient  percentage (comment);From bed;From elevated surface;With upper extremity assist (pt = 10%) Sit to Stand Details (indicate cue type and reason): performed 3 x with difficulty getting pt to contribute. Max verbal cues to ateriorly weight shift, initially pushing against movement Stand to Sit: 1: +2 Total assist;Patient percentage (comment);With upper extremity assist;To elevated surface;To chair/3-in-1;To bed (pt= 10%) Stand to Sit Details: Verbal cues to contribute to control of descent.  Stand Pivot Transfers: 1: +2 Total assist;Patient percentage (comment);From elevated surface;With armrests (pt= 20%) Stand Pivot Transfer Details (indicate cue type and reason): Verbal cues for sequence, pt with significant Rt. Lean Ambulation/Gait Ambulation/Gait: No (the patient is not ready for gait yet.  )  Posture/Postural Control Posture/Postural Control: Postural limitations Postural Limitations: pt remains in kyphotic posture in standing and sitting position, pt with Rt. lateral lean Balance Balance Assessed: Yes Static Sitting Balance Static Sitting - Balance Support: Feet supported;Bilateral upper extremity supported Static Sitting - Level of Assistance: 2: Max assist Static Sitting - Comment/# of Minutes: pt unable to maintain midline, Rt. lateral lean. Verbal/tactile cues for sitting midline.  Static Standing Balance Static Standing - Level of Assistance:  ((Pt 70%)) End of Session PT - End of Session Equipment Utilized During Treatment: Gait belt (RW) Activity Tolerance:  (limited by decreased cognition) Patient left: in chair;with call bell in reach;with bed alarm set Nurse Communication: Mobility status for transfers General Behavior During Session: [facility] for tasks performed Cognition: Impaired Cognitive Impairment: Pt with decreased ability to follow commands. Pt responds with generalized statements of Oh-[place]ea to every question.   Sherie Don 01/02/2011, 1:49 PM   Sherie Don)  Carleene Mains PT, DPT Acute Rehabilitation [phone]

## 2011-01-02 NOTE — Progress Notes (Signed)
Subjective: Interval History: has complaints some mild respiratory distress.  Objective: Vital signs in last 24 hours:  Temp:  [97.9 F (36.6 C)-100.1 F (37.8 C)] 99.7 F (37.6 C) (12/14 0543) Pulse Rate:  [61-118] 86  (12/14 0543) Resp:  [24-34] 24  (12/14 0543) BP: (89-138)/(56-79) 131/78 mmHg (12/14 0543) SpO2:  [0 %-100 %] 93 % (12/14 0755) Weight:  [63.7 kg (140 lb 6.9 oz)-64.5 kg (142 lb 3.2 oz)] 142 lb 3.2 oz (64.5 kg) (12/14 0620)  Weight change: 2 kg (4 lb 6.5 oz)  Intake/Output: I/O last 3 completed shifts: In: 928.7 [I.V.:728.7; IV Piggyback:200] Out: 3650 [Other:3650]   Intake/Output this shift:     General: No acute respiratory distress  Lungs: rhonchi and wheezes diminshed air entry at the bases Cardiovascular: Regular rate and rhythm without murmur gallop or rub normal S1 and S2 Perm Cath IJ  Abdomen: Nontender, nondistended, soft, bowel sounds positive, no rebound, no ascites, no appreciable mass  Extremities: No significant cyanosis, clubbing, or edema bilateral lower extremities--right middle finger is currently dressed and dry   Lab Results:  [facility] 01/01/11 0755  WBC 4.8  HGB 9.1*  HCT 27.5*  PLT 177   BMET No results found for this basename: NA:3,K:3,CL:3,CO2:3,GLUCOSE:3,BUN:3,CREATININE:3,CALCIUM:3,MAG:3,PHOS:3 in the last 72 hours LFT No results found for this basename: PROT,ALBUMIN,AST,ALT,ALKPHOS,BILITOT,BILIDIR,IBILI in the last 72 hours PT/INR No results found for this basename: LABPROT:2,INR:2 in the last 72 hours Hepatitis Panel No results found for this basename: HEPBSAG,HCVAB,HEPAIGM,HEPBIGM in the last 72 hours  Studies/Results: Dg Chest Port 1 View  01/01/2011  *RADIOLOGY REPORT*  Clinical Data: Coughing and rhonchi.  PORTABLE CHEST - 1 VIEW  Comparison: 12/30/2010  Findings: The cardiopericardial silhouette is enlarged.  The retrocardiac collapse / consolidation with left effusion persists. There is some improved aeration at the  right base.  Right IJ dialysis catheter remains in place. Telemetry leads overlie the chest.  IMPRESSION: Persistent retrocardiac collapse / consolidation with effusion.  Improved aeration at the right base.  Original Report Authenticated By: ERIC A. MANSELL, M.D.    I have reviewed the patient's current medications.  Assessment/Plan: ESRD- TTS dialysis plan in AM  ANEMIA-Aranesp  MBD- IV Vitamin D  HTN/VOL- appears stable with Norvasc  ACCESS Perm Catheter  OTHER- R middle finger consult wound care  VDRF in setting of cardiac arrest, shock with no plans to escalate care. Anoxic encephalopathy. Will plan dialysis in the tomorrow. Unfortunately appears in some respiratory difficulty this morning. He had very adequate ultrafiltration with dialysis yesterday. I believe he is aspirating.      LOS: 20 Kenyan Karnes W @TODAY @8 :07 AM

## 2011-01-02 NOTE — Plan of Care (Signed)
Problem: Phase III Progression Outcomes Goal: OOB with assistance Outcome: Not Progressing OOB with PT-2 person assist and to get pt. Back to bed-maximove used.

## 2011-01-02 NOTE — Progress Notes (Signed)
Subjective: 77 y/o AAM with ESRD on HD who had dialysis the morning of his admission and had syncope at home afterwards. According to EMS, the patient had an AED applied by the fire department who responded first which advised a shockable rhythm. He was shocked one time. EMS gave him 1 mg of epinephrine. He was intubated orally at the scene. He also had an EJ established at the scene as well. Sinus rhythm with multiple premature beats was established with palpable blood pressure. He was initially admitted to the critical care service due to his ventilator dependent status. He was transferred to [facility] on December 9.  Today he is alert and very confused and dysarthrti Objective: Weight change: 2 kg (4 lb 6.5 oz)  Intake/Output Summary (Last 24 hours) at 01/02/11 1440 Last data filed at 01/02/11 1427  Gross per 24 hour  Intake 1168.67 ml  Output      0 ml  Net 1168.67 ml   Blood pressure 121/72, pulse 103, temperature 100.7 F (38.2 C), temperature source Oral, resp. rate 20, height 5\' 11"  (1.803 m), weight 64.5 kg (142 lb 3.2 oz), SpO2 96.00%. Temp:  [99.7 F (37.6 C)-100.7 F (38.2 C)] 100.7 F (38.2 C) (12/14 1427) Pulse Rate:  [71-118] 103  (12/14 1427) Resp:  [20-24] 20  (12/14 1427) BP: (121-132)/(66-78) 121/72 mmHg (12/14 1427) SpO2:  [0 %-97 %] 96 % (12/14 1427) Weight:  [64.5 kg (142 lb 3.2 oz)] 142 lb 3.2 oz (64.5 kg) (12/14 0620)  Physical Exam: General: moderate respiratory distress Lungs: full of rhonchi  Cardiovascular: Regular rate and rhythm without murmur gallop or rub normal S1 and S2 Abdomen: Nontender, nondistended, soft, bowel sounds positive, no rebound, no ascites, no appreciable mass Extremities: No significant cyanosis, clubbing, or edema bilateral lower extremities-right middle finger is currently dressed and dry Neuro- alert but confused as noted above- slurred speech   Lab Results:  [facility] 01/01/11 0755  WBC 4.8  NEUTROABS --  HGB  9.1*  HCT 27.5*  MCV 86.8  PLT 177    Micro Results: Recent Results (from the past 240 hour(s))  CULTURE, RESPIRATORY     Status: Normal   Collection Time   12/25/10  5:35 PM      Component Value Range Status Comment   Specimen Description TRACHEAL ASPIRATE   Final    Special Requests NONE   Final    Gram Stain     Final    Value: RARE WBC PRESENT, PREDOMINANTLY PMN     NO SQUAMOUS EPITHELIAL CELLS SEEN     NO ORGANISMS SEEN   Culture NO GROWTH 2 DAYS   Final    Report Status 12/28/2010 FINAL   Final   CULTURE, BLOOD (ROUTINE X 2)     Status: Normal   Collection Time   12/25/10  5:48 PM      Component Value Range Status Comment   Specimen Description BLOOD RIGHT HAND   Final    Special Requests BOTTLES DRAWN AEROBIC AND ANAEROBIC [facility]   Final    Setup Time [id number]   Final    Culture NO GROWTH 5 DAYS   Final    Report Status 01/01/2011 FINAL   Final   CULTURE, BLOOD (ROUTINE X 2)     Status: Normal   Collection Time   12/25/10  6:20 PM      Component Value Range Status Comment   Specimen Description BLOOD RIGHT HAND   Final  Special Requests BOTTLES DRAWN AEROBIC ONLY 10CC   Final    Setup Time [id number]   Final    Culture NO GROWTH 5 DAYS   Final    Report Status 01/01/2011 FINAL   Final   CLOSTRIDIUM DIFFICILE BY PCR     Status: Normal   Collection Time   12/25/10  9:54 PM      Component Value Range Status Comment   C difficile by pcr NEGATIVE  NEGATIVE  Final     Studies/Results: Scheduled Meds:    . antiseptic oral rinse  15 mL Mouth Rinse BID  . darbepoetin (ARANESP) injection - DIALYSIS  60 mcg Intravenous Q Thu-HD  . imipenem-cilastatin  250 mg Intravenous Q12H  . levalbuterol  0.63 mg Nebulization QID  . paricalcitol      . paricalcitol  2 mcg Intravenous Q T,Th,Sa-HD  . vancomycin  500 mg Intravenous Q T,Th,Sa-HD  . DISCONTD: amLODipine  10 mg Per Tube QHS  . DISCONTD: antiseptic oral rinse  1 application Mouth Rinse QID  . DISCONTD:  feeding supplement  1 Container Oral TID BM  . DISCONTD: feeding supplement  30 mL Oral TID WC  . DISCONTD: metoprolol tartrate  25 mg Oral Q6H  . DISCONTD: pantoprazole sodium  40 mg Oral Q24H   Continuous Infusions:    . dextrose 40 mL/hr at 01/02/11 1316   PRN Meds:.sodium chloride, sodium chloride, sodium chloride, sodium chloride, acetaminophen, alteplase, camphor-menthol, feeding supplement (NEPRO CARB STEADY), feeding supplement (NEPRO CARB STEADY), food thickener, heparin, heparin, heparin, heparin, heparin, hydrALAZINE, levalbuterol, lidocaine, lidocaine, lidocaine-prilocaine, lidocaine-prilocaine, loperamide, pentafluoroprop-tetrafluoroeth, pentafluoroprop-tetrafluoroeth, traMADol  Assessment/Plan:  Recurrent aspiration pneumonia: On intravenous antibiotics with vancomycin and Zosyn since 12/25/10. NPO. Will need goals of care once family can be reached.   VDRF in setting of cardiac arrest extubated 12/7 with no plan for reintubation  presumed VF leading to cardiac arrest (hx of systolic CHF and post-dialysis hypokalemia)   Severe hypoglycemia on D10    Diarrhea  Likely due to antibiotics-C. difficile negative-  R distal middle finger traumatic injury Wound care consult requested by nephrology is not necessary in that patient has been seen by hand surgeon-continue the recommendations from the hand surgeon for care  Shock:  Resolved Per family discussion no further escalation of care, CPR, cardioversion or pressors  Hypokalemia Resolved  Thrombocytopenia Resolved  HTN Well-controlled at present  Acute on chronic systolic CHF Volume being handled via dialysis  ESRD  HD per renal on his usual T,Th,Sat schedule  Anoxic encephalopathy:  His mental status appears to be waxing and waning-this may be his new baseline mental status-we will continue to follow  Anemia  Hemoglobin is holding steady  CODE STATUS: DNR     LOS: 20 days   Pilar Westergaard 01/02/2011,  2:40 PM

## 2011-01-02 NOTE — Progress Notes (Signed)
CSW spoke with dtr who stated she toured [facility], [facility], and [facility]. Dtr stated she liked all facilities and wanted to speak with family before making a decision on SNF. CSW agreed to follow up with dtr on Monday 01/05/11 in order to determine her choice. CSW will continue to follow. [place], [place] [phone]

## 2011-01-02 NOTE — Progress Notes (Signed)
CSW called and left a message with dtr regarding her touring facilities for SNF. Per progression meeting, patient is not ready to dc. CSW will continue to follow to assist with dc needs. [place], [place] [phone]

## 2011-01-02 NOTE — Procedures (Signed)
Modified Barium Swallow Procedure Note Patient Details  Name: ATLEE KLUTH MRN: [id number] Date of Birth: 1928-09-04  Today's Date: 01/02/2011 Time:  -     Past Medical History:  Past Medical History  Diagnosis Date  . Chronic systolic heart failure 09/06/2009  . ESRD on hemodialysis     Tues, Thurs, Sat  . Cardiomyopathy   . HTN (hypertension)   . Hyperlipemia   . Anemia     Multifactorial - iron deficiency and anemia of chronic disease in setting of ESRD. Last anemia panel (07/2005 ) - Fe 16, TIBC  203, Ferritin 350.  Marland Kitchen Gout   . Tricuspid regurgitation   . Thrombus     H/o apical thrombus  . Cardiac arrest 12/09/2007    with pulseless electrical activity 2/2 severe hyperkalemia (K > 7.5)  . Thyroid disease   . COPD (chronic obstructive pulmonary disease)   . GERD (gastroesophageal reflux disease)   . Cancer     prostate  . Rhabdomyolysis     H/O - secondary to blunt trauma to the buttock   . Secondary hyperparathyroidism   . 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 Chronic idiopathic thrombocytopenia     BL platelet 100-120.  Marland Kitchen Syncope     H/O multiple syncopal episodes thought to be vasovagal versus hypotension induced. Last episode  10/2009   Past Surgical History:  Past Surgical History  Procedure Date  . Hernia repair   . Av fistula placement   . Jugular catheter placement   . 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 Right common iliac artery aneurysm repair 10/2009   HPI:     Recommendation/Prognosis  Clinical Impression Dysphagia Diagnosis: Severe oral phase dysphagia;Moderate pharyngeal phase dysphagia Clinical impression: Pt with a moderate oropharyngeal dysphagia due primarily to poor mentation and limited responsiveness to POs.  Pt with limited oral manipulation of liquid POS with a delayed transit and severe residuals remaining.  Boluses fall to pharynx and pt eventually initiates a stong  swallow resoppnse.  However oral residuals spill to pharynx and are aspirated without appropriate response.  Pt at high aspiraiton risk at this time.  If mentation improves pt will ikely tolerate PO consumption.  Until that time it is recommended that pt stay NPO.  Recommendations General Recommendation: NPO;Alternative means - temporary Prognosis Prognosis for Safe Diet Advancement: Guarded Individuals Consulted Consulted and Agree with Results and Recommendations: MD  SLP Goals   Pt will consume PO trials, maintaining appropriate alertness and responsiveness prior to repeat MBS.     Davinity Fanara, Riley Nearing 01/02/2011, 2:52 PM

## 2011-01-03 ENCOUNTER — Inpatient Hospital Stay (HOSPITAL_COMMUNITY): Payer: Medicare Other

## 2011-01-03 LAB — BLOOD GAS, ARTERIAL
Acid-Base Excess: 5.2 mmol/L — ABNORMAL HIGH (ref 0.0–2.0)
Drawn by: 332341
FIO2: 100 %
pCO2 arterial: 32.4 mmHg — ABNORMAL LOW (ref 35.0–45.0)

## 2011-01-03 LAB — GLUCOSE, CAPILLARY
Glucose-Capillary: 87 mg/dL (ref 70–99)
Glucose-Capillary: 88 mg/dL (ref 70–99)

## 2011-01-03 LAB — RENAL FUNCTION PANEL
Albumin: 1.7 g/dL — ABNORMAL LOW (ref 3.5–5.2)
Chloride: 99 mEq/L (ref 96–112)
GFR calc Af Amer: 8 mL/min — ABNORMAL LOW (ref >90)
GFR calc non Af Amer: 7 mL/min — ABNORMAL LOW (ref >90)
Phosphorus: 3.9 mg/dL (ref 2.3–4.6)
Potassium: 3.8 mEq/L (ref 3.5–5.1)
Sodium: 134 mEq/L — ABNORMAL LOW (ref 135–145)

## 2011-01-03 LAB — CBC
MCV: 86.8 fL (ref 78.0–100.0)
Platelets: 159 10*3/uL (ref 150–400)
RBC: 3.17 MIL/uL — ABNORMAL LOW (ref 4.22–5.81)
RDW: 16.9 % — ABNORMAL HIGH (ref 11.5–15.5)
WBC: 8.7 10*3/uL (ref 4.0–10.5)

## 2011-01-03 MED ORDER — METOPROLOL TARTRATE 1 MG/ML IV SOLN
5.0000 mg | Freq: Once | INTRAVENOUS | Status: DC
  Filled 2011-01-03: qty 5

## 2011-01-03 MED ORDER — MORPHINE SULFATE 2 MG/ML IJ SOLN
1.0000 mg | INTRAMUSCULAR | Status: DC | PRN
  Administered 2011-01-06 – 2011-01-09 (×8): 1 mg via INTRAVENOUS
  Filled 2011-01-03 (×8): qty 1

## 2011-01-03 MED ORDER — PARICALCITOL 5 MCG/ML IV SOLN
INTRAVENOUS | Status: AC
  Administered 2011-01-03: 2 ug via INTRAVENOUS
  Filled 2011-01-03: qty 1

## 2011-01-03 MED ORDER — METOPROLOL TARTRATE 1 MG/ML IV SOLN
INTRAVENOUS | Status: AC
  Administered 2011-01-03: 1 mg via INTRAVENOUS
  Filled 2011-01-03: qty 5

## 2011-01-03 MED ORDER — METOPROLOL TARTRATE 1 MG/ML IV SOLN
1.0000 mg | Freq: Once | INTRAVENOUS | Status: AC
  Administered 2011-01-03: 1 mg via INTRAVENOUS

## 2011-01-03 NOTE — Progress Notes (Signed)
01/03/11 patient had a 10 beat of v tach. Asympotomatic Dr. Isidoro Donning notified family at bedside.   Merwin Breden Consuella Lose 01/03/2011  4:48 PM

## 2011-01-03 NOTE — Progress Notes (Signed)
Subjective: Interval History: none. Less dyspneic but with rhonchi breath sounds  Objective: Vital signs in last 24 hours:  Temp:  [99.9 F (37.7 C)-101.5 F (38.6 C)] 101.1 F (38.4 C) (12/15 0634) Pulse Rate:  [90-112] 112  (12/15 0830) Resp:  [18-28] 28  (12/15 0830) BP: (98-136)/(47-85) 105/47 mmHg (12/15 0830) SpO2:  [90 %-100 %] 100 % (12/15 0634) Weight:  [61.9 kg (136 lb 7.4 oz)-62.5 kg (137 lb 12.6 oz)] 136 lb 7.4 oz (61.9 kg) (12/15 1610)  Weight change: -5.5 kg (-12 lb 2 oz)  Intake/Output: I/O last 3 completed shifts: In: 1863 [P.O.:240; I.V.:1323; IV Piggyback:300] Out: -    Intake/Output this shift:     General: No acute respiratory distress  Lungs: rhonchi and wheezes diminshed air entry at the bases  Cardiovascular: Regular rate and rhythm without murmur gallop or rub normal S1 and S2 Perm Cath IJ  Abdomen: Nontender, nondistended, soft, bowel sounds positive, no rebound, no ascites, no appreciable mass  Extremities: No significant cyanosis, clubbing, or edema bilateral lower extremities--right middle finger is currently dressed and dry    Lab Results:  [facility] 01/03/11 0709 01/01/11 0755  WBC 8.7 4.8  HGB 9.1* 9.1*  HCT 27.5* 27.5*  PLT 159 177   BMET  Basename 01/03/11 0709  NA 134*  K 3.8  CL 99  CO2 28  GLUCOSE 95  BUN 27*  CREATININE 6.73*  CALCIUM 8.3*  PHOS 3.9   LFT  Basename 01/03/11 0709  PROT --  ALBUMIN 1.7*  AST --  ALT --  ALKPHOS --  BILITOT --  BILIDIR --  IBILI --   PT/INR No results found for this basename: LABPROT:2,INR:2 in the last 72 hours Hepatitis Panel No results found for this basename: HEPBSAG,HCVAB,HEPAIGM,HEPBIGM in the last 72 hours  Studies/Results: Dg Chest Port 1 View  01/01/2011  *RADIOLOGY REPORT*  Clinical Data: Coughing and rhonchi.  PORTABLE CHEST - 1 VIEW  Comparison: 12/30/2010  Findings: The cardiopericardial silhouette is enlarged.  The retrocardiac collapse / consolidation with left  effusion persists. There is some improved aeration at the right base.  Right IJ dialysis catheter remains in place. Telemetry leads overlie the chest.  IMPRESSION: Persistent retrocardiac collapse / consolidation with effusion.  Improved aeration at the right base.  Original Report Authenticated By: ERIC A. MANSELL, M.D.   Dg Swallowing Func-no Report  01/02/2011  CLINICAL DATA: dysphagia   FLUOROSCOPY FOR SWALLOWING FUNCTION STUDY:  Fluoroscopy was provided for swallowing function study, which was  administered by a speech pathologist.  Final results and recommendations  from this study are contained within the speech pathology report.      I have reviewed the patient's current medications.  Assessment/Plan: ESRD- TTS dialysis plan in AM  ANEMIA-Aranesp  MBD- IV Vitamin D  HTN/VOL- appears stable with Norvasc  ACCESS Perm Catheter  OTHER- R middle finger consult wound care  VDRF in setting of cardiac arrest, shock with no plans to escalate care. Anoxic encephalopathy. Will plan dialysis in thetoday .Fortunately appears improved.  I believe he is aspirating and this could explain the persistent fevers.     LOS: 21 Kalmen Lollar W @TODAY @9 :03 AM

## 2011-01-03 NOTE — Progress Notes (Signed)
Jackson [name]  ZOX:[id number]  DOB: 10-Jun-1928  DOA: 11/21/2010  Interval history:   77 y/o AAM with ESRD on HD who had dialysis the morning of his admission and had syncope at home afterwards. According to EMS, the patient had an AED applied by the fire department who responded first which advised a shockable rhythm. He was shocked one time. EMS gave him 1 mg of epinephrine. He was intubated orally at the scene. He also had an EJ established at the scene as well. Sinus rhythm with multiple premature beats was established with palpable blood pressure. He was initially admitted to the critical care service due to his ventilator dependent status. He was transferred to [facility] on December 9.  Subjective: Called by RN in hemodialysis the patient had 16 beats V. tach, on my examination patient appeared to be significantly congested, tachycardiac with heart rate in 120s, respiratory rate in low30s, O2 sats in the 70s to 80s.  Objective: Weight change: -5.5 kg (-12 lb 2 oz)  Intake/Output Summary (Last 24 hours) at 01/03/11 1437 Last data filed at 01/03/11 1120  Gross per 24 hour  Intake 1234.33 ml  Output    378 ml  Net 856.33 ml   Blood pressure 97/57, pulse 109, temperature 98.7 F (37.1 C), temperature source Oral, resp. rate 22, height 5\' 11"  (1.803 m), weight 61.4 kg (135 lb 5.8 oz), SpO2 92.00%.  Physical Exam: General: Alert in respiratory distress, congestion HEENT: anicteric sclera, pupils reactive to light and accommodation, EOMI CVS: S1-S2 clear, no murmur rubs or gallops Chest: Afib, irregular Abdomen: soft nontender, nondistended, normal bowel sounds, no organomegaly Extremities: no cyanosis, clubbing or edema noted bilaterally   Lab Results: Basic Metabolic Panel:  Lab 01/03/11 8119 12/30/10 0730 12/28/10 0455  NA 134* 131* --  K 3.8 4.1 --  CL 99 95* --  CO2 28 23 --  GLUCOSE 95 73 --  BUN 27* 28* --  CREATININE 6.73* 5.68* --  CALCIUM 8.3* 8.4  --  MG -- -- 2.0  PHOS 3.9 -- --   Liver Function Tests:  Lab 01/03/11 0709 12/30/10 0730  AST -- --  ALT -- --  ALKPHOS -- --  BILITOT -- --  PROT -- --  ALBUMIN 1.7* 1.9*     Lab 01/03/11 0709 01/01/11 0755  WBC 8.7 4.8  NEUTROABS -- --  HGB 9.1* 9.1*  HCT 27.5* 27.5*  MCV 86.8 86.8  PLT 159 177   CBG:  Lab 01/03/11 0538 01/02/11 2318 01/02/11 2154 01/02/11 1710 01/02/11 1208  GLUCAP 87 96 61* 75 66*     Micro Results: Recent Results (from the past 240 hour(s))  CULTURE, RESPIRATORY     Status: Normal   Collection Time   12/25/10  5:35 PM      Component Value Range Status Comment   Specimen Description TRACHEAL ASPIRATE   Final    Special Requests NONE   Final    Gram Stain     Final    Value: RARE WBC PRESENT, PREDOMINANTLY PMN     NO SQUAMOUS EPITHELIAL CELLS SEEN     NO ORGANISMS SEEN   Culture NO GROWTH 2 DAYS   Final    Report Status 12/28/2010 FINAL   Final   CULTURE, BLOOD (ROUTINE X 2)     Status: Normal   Collection Time   12/25/10  5:48 PM      Component Value Range Status Comment   Specimen Description BLOOD RIGHT  HAND   Final    Special Requests BOTTLES DRAWN AEROBIC AND ANAEROBIC [facility]   Final    Setup Time [id number]   Final    Culture NO GROWTH 5 DAYS   Final    Report Status 01/01/2011 FINAL   Final   CULTURE, BLOOD (ROUTINE X 2)     Status: Normal   Collection Time   12/25/10  6:20 PM      Component Value Range Status Comment   Specimen Description BLOOD RIGHT HAND   Final    Special Requests BOTTLES DRAWN AEROBIC ONLY 10CC   Final    Setup Time [id number]   Final    Culture NO GROWTH 5 DAYS   Final    Report Status 01/01/2011 FINAL   Final   CLOSTRIDIUM DIFFICILE BY PCR     Status: Normal   Collection Time   12/25/10  9:54 PM      Component Value Range Status Comment   C difficile by pcr NEGATIVE  NEGATIVE  Final     Studies/Results: Ct Head Wo Contrast  12/15/2010  *RADIOLOGY REPORT*  Clinical Data: Syncope.  Post  cardiac arrest.  End-stage renal disease.  CT HEAD WITHOUT CONTRAST  Technique:  Contiguous axial images were obtained from the base of the skull through the vertex without contrast.  Comparison: 10/09/2008.  Findings: No intracranial hemorrhage.  Prominent small vessel disease type changes.  Remote right thalamic tiny infarct.  No CT evidence of large acute infarct.  Small acute infarct cannot be excluded by CT.  No intracranial mass lesion detected on this unenhanced exam.  Global atrophy without hydrocephalus.  Vascular calcifications.  Air-fluid level right sphenoid sinus.  IMPRESSION: No intracranial hemorrhage or CT evidence of large acute infarct.  Remote small right thalamic infarct.  Prominent small vessel disease type changes.  Global atrophy.  Air-fluid level right sphenoid sinus.  Original Report Authenticated By: Fuller Canada, M.D.   Mr Brain Wo Contrast  12/30/2010  *RADIOLOGY REPORT*  Clinical Data: Slurred speech.  Renal failure.  Syncope.  MRI HEAD WITHOUT CONTRAST  Technique:  Multiplanar, multiecho pulse sequences of the brain and surrounding structures were obtained according to standard protocol without intravenous contrast.  Comparison: CT 11/25/2010  Findings: Diffusion imaging does not show any acute or subacute infarction.  There is advanced generalized brain atrophy.  There are old small vessel infarctions within the cerebellum and there is extensive chronic small vessel disease throughout the cerebral hemispheres including the thalami, basal ganglia and hemispheric white matter.  No evidence of mass lesion, acute hemorrhage, hydrocephalus or extra-axial collection.  There are scattered punctate foci of hemosiderin deposition consistent with old small vessel infarctions.  No pituitary mass.  There are inflammatory changes of the sphenoid sinus.  IMPRESSION: No acute or reversible findings.  Advanced generalized brain atrophy and chronic small vessel disease throughout the brain.   Inflammatory changes of the sphenoid sinus.  Original Report Authenticated By: Thomasenia Sales, M.D.   Dg Hand 2 View Right  12/28/2010  *RADIOLOGY REPORT*  Clinical Data: Middle finger pain and swelling.  RIGHT HAND - 2 VIEW  Comparison: None.  Findings: Soft tissue swelling is seen involving the distal middle finger.  There is no evidence of fracture or bone destruction.  No evidence of soft tissue gas or radiopaque foreign body.  No evidence of arthritis or other significant bone abnormality.  IMPRESSION: Soft tissue swelling of the distal little finger.  No osseous abnormality.  Original Report Authenticated By: Danae Orleans, M.D.   Dg Chest Port 1 View  01/01/2011  *RADIOLOGY REPORT*  Clinical Data: Coughing and rhonchi.  PORTABLE CHEST - 1 VIEW  Comparison: 12/30/2010  Findings: The cardiopericardial silhouette is enlarged.  The retrocardiac collapse / consolidation with left effusion persists. There is some improved aeration at the right base.  Right IJ dialysis catheter remains in place. Telemetry leads overlie the chest.  IMPRESSION: Persistent retrocardiac collapse / consolidation with effusion.  Improved aeration at the right base.  Original Report Authenticated By: ERIC A. MANSELL, M.D.   Dg Chest Port 1 View  12/30/2010  *RADIOLOGY REPORT*  Clinical Data: Shortness of breath.  PORTABLE CHEST - 1 VIEW  Comparison: Chest x-ray 12/26/2010.  Findings: The endotracheal  and NG tubes have been removed.  The right IJ catheter is stable.  The left subclavian central venous catheter is stable.  There are persistent bilateral infiltrates and a left pleural effusion.  Persistent central vascular congestion. Probable left pleural effusion.  IMPRESSION:  1. 1.  Removal of ET and NG tubes. 2.  Persistent central vascular congestion and patchy infiltrates. 3.  Probable left pleural effusion.  Original Report Authenticated By: P. Loralie Champagne, M.D.   Dg Chest Port 1 View  12/29/2010  *RADIOLOGY REPORT*   Clinical Data: Heart failure.  Intubated.  PORTABLE CHEST - 1 VIEW  Comparison: 12/25/2010  Findings: The endotracheal tube is in good position at the level of the aortic arch.  NG tube tip is below the diaphragm.  Left central venous catheter tip is in the superior vena cava.  Double-lumen right central catheter tips are at the cavoatrial junction.  Improved atelectasis at the right base.  Vascularity has improved and is now normal.    Persistent left effusion and atelectasis / consolidation at the left base although aeration has slightly improved.  IMPRESSION:  Improved aeration at both bases.  However, there is persistent density particularly on the left consistent with effusion, atelectasis, and probable consolidation in the left lower lobe.  Original Report Authenticated By: Gwynn Burly, M.D.   Dg Chest Port 1 View  12/25/2010  *RADIOLOGY REPORT*  Clinical Data: Endotracheal tube placement, shortness of breath  PORTABLE CHEST - 1 VIEW  Comparison: Portable chest x-ray of 12/24/2010  Findings: There has been an increase in opacity at the lung bases. This may reflect atelectasis and effusions, but pneumonia cannot be excluded.  Cardiomegaly is stable.  Endotracheal tube, left central venous line, and right central venous line are unchanged in position.  IMPRESSION: Increase in basilar opacities left greater than right.  Possible atelectasis and effusions but cannot exclude pneumonia.  Original Report Authenticated By: Juline Patch, M.D.   Dg Chest Port 1 View  12/24/2010  *RADIOLOGY REPORT*  Clinical Data: Assess endotracheal tube.  PORTABLE CHEST - 1 VIEW  Comparison: 12/23/2010.  Findings: Endotracheal tube, nasogastric tube and left subclavian central line appear unchanged.  Right IJ dialysis catheter again noted.  Dense retrocardiac density is present compatible with atelectasis, airspace disease and left pleural effusion.  Slight worsening of aeration at the right lung base probably represents  either edema or atelectasis.  This may represent a combination of both.  IMPRESSION:  1.  Stable support apparatus in good position. 2.  Worsening of aeration at the right lung base, likely increasing atelectasis.  Original Report Authenticated By: Andreas Newport, M.D.   Dg Chest Port 1 View  12/23/2010  *RADIOLOGY REPORT*  Clinical Data:  Endotracheal tube placement  PORTABLE CHEST - 1 VIEW  Comparison: Portable chest x-ray of 12/22/2010  Findings: The tip of the endotracheal tube is approximately 3.8 cm above the carina.  Aeration has improved with improvement in pulmonary vascular congestion as well.  There is still persistent opacity at the left lung base which may represent atelectasis, effusion, or possibly pneumonia.  Mild cardiomegaly is stable. Left and right central venous lines remain.  IMPRESSION:  1.  Improved aeration and improved pulmonary vascular congestion. 2.  Tip of endotracheal tube 3.8 cm above carina. 3.  Persistent left basilar opacity.  Original Report Authenticated By: Juline Patch, M.D.   Dg Chest Port 1 View  12/22/2010  *RADIOLOGY REPORT*  Clinical Data: Endotracheal tube placement  PORTABLE CHEST - 1 VIEW  Comparison: 12/21/2010  Findings: Cardiomediastinal silhouette is stable.  Slight worsening congestion/pulmonary edema.  Stable endotracheal tube position with tip 5.2 cm above the carina.  Right dialysis catheter is unchanged in position.  Stable left subclavian central line position. Probable bilateral small pleural effusion and bilateral basilar atelectasis or infiltrate.  IMPRESSION:  Slight worsening congestion/pulmonary edema.  Stable endotracheal tube position with tip 5.2 cm above the carina.  Right dialysis catheter is unchanged in position.  Stable left subclavian central line position.  Probable bilateral small pleural effusion and bilateral basilar atelectasis or infiltrate.  Original Report Authenticated By: Natasha Mead, M.D.   Dg Chest Port 1 View  12/21/2010   *RADIOLOGY REPORT*  Clinical Data: Endotracheal tube placement.  PORTABLE CHEST - 1 VIEW  Comparison: 12/20/2010  Findings: Endotracheal tube is 4.5 cm above the carina.  Improved aeration and probably decreased edema in the lower lungs.  Dialysis catheter tip near the cavoatrial junction.  Left subclavian central line in the upper SVC region.  Heart size is stable.  IMPRESSION: Improved aeration at the lung bases suggests decreased pulmonary edema.  Original Report Authenticated By: Richarda Overlie, M.D.   Dg Chest Port 1 View  12/20/2010  *RADIOLOGY REPORT*  Clinical Data: Perforation, ventilatory support, renal failure  PORTABLE CHEST - 1 VIEW  Comparison: 12/19/2010  Findings: Endotracheal tube 2.8 cm above the carina.  Bilateral central lines and NG tube stable position.  Monitor leads overlie the chest.  Heart remains enlarged with worsening diffuse pulmonary edema pattern noted.  Pleural effusions present bilaterally with basilar atelectasis / consolidation, worse in the left lower lobe. No pneumothorax.  IMPRESSION: Worsening CHF pattern.  Original Report Authenticated By: Judie Petit. Ruel Favors, M.D.   Dg Chest Port 1 View  12/19/2010  *RADIOLOGY REPORT*  Clinical Data: Assess endotracheal tube.  PORTABLE CHEST - 1 VIEW  Comparison: 12/18/2010.  Findings: Endotracheal tube tip 4 cm above the carina.  Right sided split catheter with tips in the region of the distal superior vena cava.  Left central line tip mid superior vena cava level. Nasogastric tube courses below the diaphragm.  The tip is not included on this exam.  No gross pneumothorax.  Cardiomegaly.  Central pulmonary vascular prominence/mild pulmonary edema.  Calcified tortuous aorta.  Bibasilar patchy opacity may represent atelectasis and crowding of vessels although infectious infiltrate not excluded.  IMPRESSION: No significant change.  Original Report Authenticated By: Fuller Canada, M.D.   Dg Chest Port 1 View  12/18/2010  *RADIOLOGY REPORT*   Clinical Data: Evaluate endotracheal tube position  PORTABLE CHEST - 1 VIEW  Comparison: Portable chest x-ray of 12/17/2010  Findings: The tip of the endotracheal tube is approximately 8-0.6 cm above  the carina.  There is evidence of pulmonary vascular congestion present with probable small effusions.  Cardiomegaly is stable.  Left and right central venous lines remain.  IMPRESSION:  1.  Endotracheal tip approximately 2.6 cm above the carina. 2.  Little change in probable mild pulmonary vascular congestion and small effusions.  Original Report Authenticated By: Juline Patch, M.D.   Dg Chest Port 1 View  12/17/2010  *RADIOLOGY REPORT*  Clinical Data: ET tube placement, shortness of breath.  PORTABLE CHEST - 1 VIEW  Comparison: None.  Findings: Diffuse interstitial prominence throughout the lungs, likely mild edema.  Heart is borderline in size.  There is bibasilar atelectasis or infiltrates, increased since prior study.  IMPRESSION: Increasing bibasilar atelectasis or infiltrates.  Increasing interstitial prominence, likely interstitial edema.  Original Report Authenticated By: Cyndie Chime, M.D.   Dg Chest Port 1 View  12/16/2010  *RADIOLOGY REPORT*  Clinical Data: Query aspiration.  PORTABLE CHEST - 1 VIEW  Comparison: 12/16/2010 at 5:33 a.m.  Findings: The endotracheal tube tip is approximately 2 cm above the carina.  Nasogastric tube is in place.  A right internal jugular central venous catheter is noted with tip at the cavoatrial junction.  Left central line tip projects over the SVC.  Airspace opacity noted in the left lower lobe.  There is hazy opacity along the right hemithorax but with improved aeration of the right lower lobe.  IMPRESSION: 1.  Improved aeration of the right lower lobe, with hazy opacity over the right hemithorax which could reflect a layering pleural effusion.  2.  Continued airspace opacity in the left lower lobe, possibly pneumonia or atelectasis. Aspiration pneumonitis cannot  be excluded, although the endotracheal tube may have a protective effect against new aspiration.  3.  The endotracheal tube has been retracted and its tip is 2 cm above the carina.  Original Report Authenticated By: Dellia Cloud, M.D.   Dg Chest Port 1 View  12/16/2010  *RADIOLOGY REPORT*  Clinical Data: Respiratory failure.  PORTABLE CHEST - 1 VIEW  Comparison: 12/15/2010.  Findings: Endotracheal tube tip is at the level of the carina. Recommend retracting endotracheal tube tip by 3 cm.  Right central line tip distal superior vena cava level.  Left central line tip mid superior vena cava level.  Cardiomegaly.  Asymmetric air space disease most notable in the lung bases.  This may represent pulmonary edema although infiltrate not excluded in the proper clinical setting.  No gross pneumothorax.  Nasogastric tube courses below the diaphragm.  The tip is not included on this exam.  IMPRESSION: Endotracheal tube tip is at the level of the carina.   Recommend retracting endotracheal tube tip by 3 cm.  Asymmetric air space disease most notable lung bases without significant change.  Question pulmonary edema and / or infectious infiltrate?  Original Report Authenticated By: Fuller Canada, M.D.   Dg Chest Port 1 View  12/15/2010  *RADIOLOGY REPORT*  Clinical Data: Respiratory failure.  PORTABLE CHEST - 1 VIEW  Comparison: Chest x-ray 11/24/2010.  Findings: The endotracheal tube is 13 mm above the carina and should be retracted 2 cm. The remaining support apparatus is stable.  The cardiac silhouette, mediastinal and hilar contours are stable.  The lungs demonstrate slight improved aeration with resolving edema and atelectasis.  IMPRESSION:  1.  The endotracheal tube is 13 mm above the carina and should be retracted 2 cm. 2.  The remaining support apparatus is stable. 3.  Slight improved lung  aeration with resolving edema and atelectasis or infiltrates.  Original Report Authenticated By: P. Loralie Champagne,  M.D.   Dg Chest Port 1 View  Jan 10, 2011  *RADIOLOGY REPORT*  Clinical Data: Central line placement  PORTABLE CHEST - 1 VIEW  Comparison: Portable exam 1650 hours compared to 1325 hours  Findings: Left subclavian venous catheter, tip projects over SVC. Endotracheal tube, tip 3.5 cm above carina. Right jugular dual-lumen central venous catheter, tip projecting over SVC. Nasogastric tube extends into stomach. Normal heart size. Tortuous aorta with atherosclerotic calcification. Bilateral airspace infiltrates, slightly increased on the left since previous exam. No pneumothorax.  IMPRESSION: No pneumothorax following central line placement. Bilateral pulmonary infiltrates greater on the right, increased left lower lobe since previous exam.  Original Report Authenticated By: Lollie Marrow, M.D.   Dg Chest Portable 1 View  01/10/2011  *RADIOLOGY REPORT*  Clinical Data: Intubated, unresponsive  PORTABLE CHEST - 1 VIEW  Comparison: 11/13/2010  Findings: Endotracheal tube tip 3.5 cm above carina.  Nasogastric tube been placed at least as far as the stomach, tip not seen. Right IJ central line stable in position.  There are new alveolar opacities centrally in both lung bases and in the right upper lobe. No definite effusion although the lateral costophrenic angles are excluded.  Heart size upper limits normal for technique.  IMPRESSION: 1.  Endotracheal tube and nasogastric tube placement as above. 2.  New bibasilar right upper lobe alveolar opacities, possibly asymmetric edema versus infiltrates.  Original Report Authenticated By: Osa Craver, M.D.   Dg Swallowing Func-no Report  01/02/2011  CLINICAL DATA: dysphagia   FLUOROSCOPY FOR SWALLOWING FUNCTION STUDY:  Fluoroscopy was provided for swallowing function study, which was  administered by a speech pathologist.  Final results and recommendations  from this study are contained within the speech pathology report.      Medications: Scheduled Meds:    . antiseptic oral rinse  15 mL Mouth Rinse BID  . darbepoetin (ARANESP) injection - DIALYSIS  60 mcg Intravenous Q Thu-HD  . dextrose      . imipenem-cilastatin  250 mg Intravenous Q12H  . levalbuterol  0.63 mg Nebulization QID  . metoprolol  1 mg Intravenous Once  . metoprolol  5 mg Intravenous Once  . paricalcitol  2 mcg Intravenous Q T,Th,Sa-HD  . vancomycin  500 mg Intravenous Q T,Th,Sa-HD  . DISCONTD: amLODipine  10 mg Per Tube QHS  . DISCONTD: feeding supplement  1 Container Oral TID BM  . DISCONTD: feeding supplement  30 mL Oral TID WC  . DISCONTD: levalbuterol  0.63 mg Nebulization QID  . DISCONTD: metoprolol tartrate  25 mg Oral Q6H  . DISCONTD: pantoprazole sodium  40 mg Oral Q24H   Continuous Infusions:   . dextrose 60 mL/hr at 01/03/11 1323     Assessment/Plan:  Atrial fibrillation with RVR and acute respiratory failure hypoxic: - Patient was given 5 mg of IV Lopressor with stat EKG revealing atrial fibrillation with RVR, patient was given one breathing treatment and when necessary morphine. His heart rate and respiratory status improved somewhat likely patient has aspiration event leading to the acute respiratory failure.  Recurrent aspiration pneumonia: On intravenous antibiotics with vancomycin and Zosyn since 12/25/10. NPO.  Will need goals of care once family can be reached.  VDRF in setting of cardiac arrest  extubated 12/7 with no plan for reintubation  presumed VF leading to cardiac arrest (hx of systolic CHF and post-dialysis hypokalemia)  Severe hypoglycemia on D10  Diarrhea  Likely due to antibiotics-C. difficile negative-  R distal middle finger traumatic injury  Wound care consult requested by nephrology is not necessary in that patient has been seen by hand surgeon-continue the recommendations from the hand surgeon for care  Shock:  Resolved  Per family discussion no further escalation of care, CPR, cardioversion or pressors  per CCM  notes.Hypokalemia  Thrombocytopenia  Resolved  HTN  Well-controlled at present  Acute on chronic systolic CHF  Volume being handled via dialysis  ESRD  HD per renal on his usual T,Th,Sat schedule  Anoxic encephalopathy:  His mental status appears to be waxing and waning-this may be his new baseline mental status-we will continue to follow  Anemia  Hemoglobin is holding steady  CODE STATUS: DNR. I discussed in detail with the patient's daughter, Hillis Range in detail, requested no escalation of MedicAL care. Discussed with palliative medicine, Dr. Derenda Mis for consultation.    LOS: 21 days   Niza Soderholm 01/03/2011, 2:37 PM

## 2011-01-04 ENCOUNTER — Encounter (HOSPITAL_COMMUNITY): Payer: Self-pay | Admitting: Internal Medicine

## 2011-01-04 LAB — GLUCOSE, CAPILLARY
Glucose-Capillary: 86 mg/dL (ref 70–99)
Glucose-Capillary: 105 mg/dL — ABNORMAL HIGH (ref 70–99)
Glucose-Capillary: 239 mg/dL — ABNORMAL HIGH (ref 70–99)

## 2011-01-04 MED ORDER — DEXTROSE 10 % IV SOLN
INTRAVENOUS | Status: DC
  Administered 2011-01-06: 20 mL/h via INTRAVENOUS
  Administered 2011-01-09: via INTRAVENOUS

## 2011-01-04 NOTE — Progress Notes (Signed)
Patient had 10 beat run of V tach asymptomatic, Dr aware  Adali Pennings Consuella Lose 01/04/2011  8:04 AM

## 2011-01-04 NOTE — Progress Notes (Signed)
Jackson [name]  XBJ:[id number]  DOB: 02-03-1928  DOA: 01/11/2011  Interval history:   77 y/o AAM with ESRD on HD who had dialysis the morning of his admission and had syncope at home afterwards. According to EMS, the patient had an AED applied by the fire department who responded first which advised a shockable rhythm. He was shocked one time. EMS gave him 1 mg of epinephrine. He was intubated orally at the scene. He also had an EJ established at the scene as well. Sinus rhythm with multiple premature beats was established with palpable blood pressure. He was initially admitted to the critical care service due to his ventilator dependent status. He was transferred to [facility] on December 9.  Subjective: Patient seen today, sitting in the chair with oxygen supplementation and nasal cannula, alert, denies any complaints.   Objective: Weight change: -1.1 kg (-2 lb 6.8 oz)  Intake/Output Summary (Last 24 hours) at 01/04/11 1335 Last data filed at 01/03/11 1829  Gross per 24 hour  Intake    843 ml  Output      0 ml  Net    843 ml   Blood pressure 114/71, pulse 111, temperature 98.5 F (36.9 C), temperature source Oral, resp. rate 21, height 5\' 11"  (1.803 m), weight 66.5 kg (146 lb 9.7 oz), SpO2 100.00%.  Physical Exam: General: Alert, congestion HEENT: anicteric sclera, pupils reactive to light and accommodation, EOMI CVS: S1-S2 clear, no murmur rubs or gallops, irregular Chest: Congested bilaterally Abdomen: soft nontender, nondistended, normal bowel sounds, no organomegaly Extremities: no cyanosis, clubbing or edema noted bilaterally   Lab Results: Basic Metabolic Panel:  Lab 01/03/11 3086 12/30/10 0730  NA 134* 131*  K 3.8 4.1  CL 99 95*  CO2 28 23  GLUCOSE 95 73  BUN 27* 28*  CREATININE 6.73* 5.68*  CALCIUM 8.3* 8.4  MG -- --  PHOS 3.9 --   Liver Function Tests:  Lab 01/03/11 0709 12/30/10 0730  AST -- --  ALT -- --  ALKPHOS -- --  BILITOT -- --    PROT -- --  ALBUMIN 1.7* 1.9*     Lab 01/03/11 0709 01/01/11 0755  WBC 8.7 4.8  NEUTROABS -- --  HGB 9.1* 9.1*  HCT 27.5* 27.5*  MCV 86.8 86.8  PLT 159 177   CBG:  Lab 01/04/11 1218 01/04/11 0759 01/04/11 0405 01/03/11 2359 01/03/11 2004  GLUCAP 239* 105* 86 104* 120*     Micro Results: Recent Results (from the past 240 hour(s))  CULTURE, RESPIRATORY     Status: Normal   Collection Time   12/25/10  5:35 PM      Component Value Range Status Comment   Specimen Description TRACHEAL ASPIRATE   Final    Special Requests NONE   Final    Gram Stain     Final    Value: RARE WBC PRESENT, PREDOMINANTLY PMN     NO SQUAMOUS EPITHELIAL CELLS SEEN     NO ORGANISMS SEEN   Culture NO GROWTH 2 DAYS   Final    Report Status 12/28/2010 FINAL   Final   CULTURE, BLOOD (ROUTINE X 2)     Status: Normal   Collection Time   12/25/10  5:48 PM      Component Value Range Status Comment   Specimen Description BLOOD RIGHT HAND   Final    Special Requests BOTTLES DRAWN AEROBIC AND ANAEROBIC [facility]   Final    Setup Time [id number]  Final    Culture NO GROWTH 5 DAYS   Final    Report Status 01/01/2011 FINAL   Final   CULTURE, BLOOD (ROUTINE X 2)     Status: Normal   Collection Time   12/25/10  6:20 PM      Component Value Range Status Comment   Specimen Description BLOOD RIGHT HAND   Final    Special Requests BOTTLES DRAWN AEROBIC ONLY 10CC   Final    Setup Time [id number]   Final    Culture NO GROWTH 5 DAYS   Final    Report Status 01/01/2011 FINAL   Final   CLOSTRIDIUM DIFFICILE BY PCR     Status: Normal   Collection Time   12/25/10  9:54 PM      Component Value Range Status Comment   C difficile by pcr NEGATIVE  NEGATIVE  Final   CULTURE, BLOOD (ROUTINE X 2)     Status: Normal (Preliminary result)   Collection Time   01/03/11  8:40 AM      Component Value Range Status Comment   Specimen Description BLOOD HEMODIALYSIS CATHETER   Final    Special Requests     Final    Value:  BOTTLES DRAWN AEROBIC AND ANAEROBIC PT ON VANC PRIMAXCIN   Setup Time [id number]   Final    Culture     Final    Value:        BLOOD CULTURE RECEIVED NO GROWTH TO DATE CULTURE WILL BE HELD FOR 5 DAYS BEFORE ISSUING A FINAL NEGATIVE REPORT   Report Status PENDING   Incomplete   CULTURE, BLOOD (ROUTINE X 2)     Status: Normal (Preliminary result)   Collection Time   01/03/11  9:05 AM      Component Value Range Status Comment   Specimen Description BLOOD HEMODIALYSIS CATHETER   Final    Special Requests BOTTLES DRAWN AEROBIC AND ANAEROBIC   Final    Setup Time [id number]   Final    Culture     Final    Value:        BLOOD CULTURE RECEIVED NO GROWTH TO DATE CULTURE WILL BE HELD FOR 5 DAYS BEFORE ISSUING A FINAL NEGATIVE REPORT   Report Status PENDING   Incomplete     Studies/Results: Ct Head Wo Contrast  2011/01/01  *RADIOLOGY REPORT*  Clinical Data: Syncope.  Post cardiac arrest.  End-stage renal disease.  CT HEAD WITHOUT CONTRAST  Technique:  Contiguous axial images were obtained from the base of the skull through the vertex without contrast.  Comparison: 10/09/2008.  Findings: No intracranial hemorrhage.  Prominent small vessel disease type changes.  Remote right thalamic tiny infarct.  No CT evidence of large acute infarct.  Small acute infarct cannot be excluded by CT.  No intracranial mass lesion detected on this unenhanced exam.  Global atrophy without hydrocephalus.  Vascular calcifications.  Air-fluid level right sphenoid sinus.  IMPRESSION: No intracranial hemorrhage or CT evidence of large acute infarct.  Remote small right thalamic infarct.  Prominent small vessel disease type changes.  Global atrophy.  Air-fluid level right sphenoid sinus.  Original Report Authenticated By: Fuller Canada, M.D.   Mr Brain Wo Contrast  12/30/2010  *RADIOLOGY REPORT*  Clinical Data: Slurred speech.  Renal failure.  Syncope.  MRI HEAD WITHOUT CONTRAST  Technique:  Multiplanar, multiecho  pulse sequences of the brain and surrounding structures were obtained according to standard protocol without intravenous contrast.  Comparison: CT  12/10/2010  Findings: Diffusion imaging does not show any acute or subacute infarction.  There is advanced generalized brain atrophy.  There are old small vessel infarctions within the cerebellum and there is extensive chronic small vessel disease throughout the cerebral hemispheres including the thalami, basal ganglia and hemispheric white matter.  No evidence of mass lesion, acute hemorrhage, hydrocephalus or extra-axial collection.  There are scattered punctate foci of hemosiderin deposition consistent with old small vessel infarctions.  No pituitary mass.  There are inflammatory changes of the sphenoid sinus.  IMPRESSION: No acute or reversible findings.  Advanced generalized brain atrophy and chronic small vessel disease throughout the brain.  Inflammatory changes of the sphenoid sinus.  Original Report Authenticated By: Thomasenia Sales, M.D.   Dg Hand 2 View Right  12/28/2010  *RADIOLOGY REPORT*  Clinical Data: Middle finger pain and swelling.  RIGHT HAND - 2 VIEW  Comparison: None.  Findings: Soft tissue swelling is seen involving the distal middle finger.  There is no evidence of fracture or bone destruction.  No evidence of soft tissue gas or radiopaque foreign body.  No evidence of arthritis or other significant bone abnormality.  IMPRESSION: Soft tissue swelling of the distal little finger.  No osseous abnormality.  Original Report Authenticated By: Danae Orleans, M.D.   Dg Chest Port 1 View  01/01/2011  *RADIOLOGY REPORT*  Clinical Data: Coughing and rhonchi.  PORTABLE CHEST - 1 VIEW  Comparison: 12/30/2010  Findings: The cardiopericardial silhouette is enlarged.  The retrocardiac collapse / consolidation with left effusion persists. There is some improved aeration at the right base.  Right IJ dialysis catheter remains in place. Telemetry leads overlie  the chest.  IMPRESSION: Persistent retrocardiac collapse / consolidation with effusion.  Improved aeration at the right base.  Original Report Authenticated By: ERIC A. MANSELL, M.D.   Dg Chest Port 1 View  12/30/2010  *RADIOLOGY REPORT*  Clinical Data: Shortness of breath.  PORTABLE CHEST - 1 VIEW  Comparison: Chest x-ray 12/26/2010.  Findings: The endotracheal  and NG tubes have been removed.  The right IJ catheter is stable.  The left subclavian central venous catheter is stable.  There are persistent bilateral infiltrates and a left pleural effusion.  Persistent central vascular congestion. Probable left pleural effusion.  IMPRESSION:  1. 1.  Removal of ET and NG tubes. 2.  Persistent central vascular congestion and patchy infiltrates. 3.  Probable left pleural effusion.  Original Report Authenticated By: P. Loralie Champagne, M.D.   Dg Chest Port 1 View  12/29/2010  *RADIOLOGY REPORT*  Clinical Data: Heart failure.  Intubated.  PORTABLE CHEST - 1 VIEW  Comparison: 12/25/2010  Findings: The endotracheal tube is in good position at the level of the aortic arch.  NG tube tip is below the diaphragm.  Left central venous catheter tip is in the superior vena cava.  Double-lumen right central catheter tips are at the cavoatrial junction.  Improved atelectasis at the right base.  Vascularity has improved and is now normal.    Persistent left effusion and atelectasis / consolidation at the left base although aeration has slightly improved.  IMPRESSION:  Improved aeration at both bases.  However, there is persistent density particularly on the left consistent with effusion, atelectasis, and probable consolidation in the left lower lobe.  Original Report Authenticated By: Gwynn Burly, M.D.   Dg Chest Port 1 View  12/25/2010  *RADIOLOGY REPORT*  Clinical Data: Endotracheal tube placement, shortness of breath  PORTABLE CHEST - 1  VIEW  Comparison: Portable chest x-ray of 12/24/2010  Findings: There has been an  increase in opacity at the lung bases. This may reflect atelectasis and effusions, but pneumonia cannot be excluded.  Cardiomegaly is stable.  Endotracheal tube, left central venous line, and right central venous line are unchanged in position.  IMPRESSION: Increase in basilar opacities left greater than right.  Possible atelectasis and effusions but cannot exclude pneumonia.  Original Report Authenticated By: Juline Patch, M.D.   Dg Chest Port 1 View  12/24/2010  *RADIOLOGY REPORT*  Clinical Data: Assess endotracheal tube.  PORTABLE CHEST - 1 VIEW  Comparison: 12/23/2010.  Findings: Endotracheal tube, nasogastric tube and left subclavian central line appear unchanged.  Right IJ dialysis catheter again noted.  Dense retrocardiac density is present compatible with atelectasis, airspace disease and left pleural effusion.  Slight worsening of aeration at the right lung base probably represents either edema or atelectasis.  This may represent a combination of both.  IMPRESSION:  1.  Stable support apparatus in good position. 2.  Worsening of aeration at the right lung base, likely increasing atelectasis.  Original Report Authenticated By: Andreas Newport, M.D.   Dg Chest Port 1 View  12/23/2010  *RADIOLOGY REPORT*  Clinical Data: Endotracheal tube placement  PORTABLE CHEST - 1 VIEW  Comparison: Portable chest x-ray of 12/22/2010  Findings: The tip of the endotracheal tube is approximately 3.8 cm above the carina.  Aeration has improved with improvement in pulmonary vascular congestion as well.  There is still persistent opacity at the left lung base which may represent atelectasis, effusion, or possibly pneumonia.  Mild cardiomegaly is stable. Left and right central venous lines remain.  IMPRESSION:  1.  Improved aeration and improved pulmonary vascular congestion. 2.  Tip of endotracheal tube 3.8 cm above carina. 3.  Persistent left basilar opacity.  Original Report Authenticated By: Juline Patch, M.D.   Dg  Chest Port 1 View  12/22/2010  *RADIOLOGY REPORT*  Clinical Data: Endotracheal tube placement  PORTABLE CHEST - 1 VIEW  Comparison: 12/21/2010  Findings: Cardiomediastinal silhouette is stable.  Slight worsening congestion/pulmonary edema.  Stable endotracheal tube position with tip 5.2 cm above the carina.  Right dialysis catheter is unchanged in position.  Stable left subclavian central line position. Probable bilateral small pleural effusion and bilateral basilar atelectasis or infiltrate.  IMPRESSION:  Slight worsening congestion/pulmonary edema.  Stable endotracheal tube position with tip 5.2 cm above the carina.  Right dialysis catheter is unchanged in position.  Stable left subclavian central line position.  Probable bilateral small pleural effusion and bilateral basilar atelectasis or infiltrate.  Original Report Authenticated By: Natasha Mead, M.D.   Dg Chest Port 1 View  12/21/2010  *RADIOLOGY REPORT*  Clinical Data: Endotracheal tube placement.  PORTABLE CHEST - 1 VIEW  Comparison: 12/20/2010  Findings: Endotracheal tube is 4.5 cm above the carina.  Improved aeration and probably decreased edema in the lower lungs.  Dialysis catheter tip near the cavoatrial junction.  Left subclavian central line in the upper SVC region.  Heart size is stable.  IMPRESSION: Improved aeration at the lung bases suggests decreased pulmonary edema.  Original Report Authenticated By: Richarda Overlie, M.D.   Dg Chest Port 1 View  12/20/2010  *RADIOLOGY REPORT*  Clinical Data: Perforation, ventilatory support, renal failure  PORTABLE CHEST - 1 VIEW  Comparison: 12/19/2010  Findings: Endotracheal tube 2.8 cm above the carina.  Bilateral central lines and NG tube stable position.  Monitor leads overlie the chest.  Heart remains enlarged with worsening diffuse pulmonary edema pattern noted.  Pleural effusions present bilaterally with basilar atelectasis / consolidation, worse in the left lower lobe. No pneumothorax.  IMPRESSION:  Worsening CHF pattern.  Original Report Authenticated By: Judie Petit. Ruel Favors, M.D.   Dg Chest Port 1 View  12/19/2010  *RADIOLOGY REPORT*  Clinical Data: Assess endotracheal tube.  PORTABLE CHEST - 1 VIEW  Comparison: 12/18/2010.  Findings: Endotracheal tube tip 4 cm above the carina.  Right sided split catheter with tips in the region of the distal superior vena cava.  Left central line tip mid superior vena cava level. Nasogastric tube courses below the diaphragm.  The tip is not included on this exam.  No gross pneumothorax.  Cardiomegaly.  Central pulmonary vascular prominence/mild pulmonary edema.  Calcified tortuous aorta.  Bibasilar patchy opacity may represent atelectasis and crowding of vessels although infectious infiltrate not excluded.  IMPRESSION: No significant change.  Original Report Authenticated By: Fuller Canada, M.D.   Dg Chest Port 1 View  12/18/2010  *RADIOLOGY REPORT*  Clinical Data: Evaluate endotracheal tube position  PORTABLE CHEST - 1 VIEW  Comparison: Portable chest x-ray of 12/17/2010  Findings: The tip of the endotracheal tube is approximately 8-0.6 cm above the carina.  There is evidence of pulmonary vascular congestion present with probable small effusions.  Cardiomegaly is stable.  Left and right central venous lines remain.  IMPRESSION:  1.  Endotracheal tip approximately 2.6 cm above the carina. 2.  Little change in probable mild pulmonary vascular congestion and small effusions.  Original Report Authenticated By: Juline Patch, M.D.   Dg Chest Port 1 View  12/17/2010  *RADIOLOGY REPORT*  Clinical Data: ET tube placement, shortness of breath.  PORTABLE CHEST - 1 VIEW  Comparison: None.  Findings: Diffuse interstitial prominence throughout the lungs, likely mild edema.  Heart is borderline in size.  There is bibasilar atelectasis or infiltrates, increased since prior study.  IMPRESSION: Increasing bibasilar atelectasis or infiltrates.  Increasing interstitial prominence,  likely interstitial edema.  Original Report Authenticated By: Cyndie Chime, M.D.   Dg Chest Port 1 View  12/16/2010  *RADIOLOGY REPORT*  Clinical Data: Query aspiration.  PORTABLE CHEST - 1 VIEW  Comparison: 12/16/2010 at 5:33 a.m.  Findings: The endotracheal tube tip is approximately 2 cm above the carina.  Nasogastric tube is in place.  A right internal jugular central venous catheter is noted with tip at the cavoatrial junction.  Left central line tip projects over the SVC.  Airspace opacity noted in the left lower lobe.  There is hazy opacity along the right hemithorax but with improved aeration of the right lower lobe.  IMPRESSION: 1.  Improved aeration of the right lower lobe, with hazy opacity over the right hemithorax which could reflect a layering pleural effusion.  2.  Continued airspace opacity in the left lower lobe, possibly pneumonia or atelectasis. Aspiration pneumonitis cannot be excluded, although the endotracheal tube may have a protective effect against new aspiration.  3.  The endotracheal tube has been retracted and its tip is 2 cm above the carina.  Original Report Authenticated By: Dellia Cloud, M.D.   Dg Chest Port 1 View  12/16/2010  *RADIOLOGY REPORT*  Clinical Data: Respiratory failure.  PORTABLE CHEST - 1 VIEW  Comparison: 12/15/2010.  Findings: Endotracheal tube tip is at the level of the carina. Recommend retracting endotracheal tube tip by 3 cm.  Right central line tip distal superior vena cava level.  Left central  line tip mid superior vena cava level.  Cardiomegaly.  Asymmetric air space disease most notable in the lung bases.  This may represent pulmonary edema although infiltrate not excluded in the proper clinical setting.  No gross pneumothorax.  Nasogastric tube courses below the diaphragm.  The tip is not included on this exam.  IMPRESSION: Endotracheal tube tip is at the level of the carina.   Recommend retracting endotracheal tube tip by 3 cm.  Asymmetric  air space disease most notable lung bases without significant change.  Question pulmonary edema and / or infectious infiltrate?  Original Report Authenticated By: Fuller Canada, M.D.   Dg Chest Port 1 View  12/15/2010  *RADIOLOGY REPORT*  Clinical Data: Respiratory failure.  PORTABLE CHEST - 1 VIEW  Comparison: Chest x-ray 12/16/2010.  Findings: The endotracheal tube is 13 mm above the carina and should be retracted 2 cm. The remaining support apparatus is stable.  The cardiac silhouette, mediastinal and hilar contours are stable.  The lungs demonstrate slight improved aeration with resolving edema and atelectasis.  IMPRESSION:  1.  The endotracheal tube is 13 mm above the carina and should be retracted 2 cm. 2.  The remaining support apparatus is stable. 3.  Slight improved lung aeration with resolving edema and atelectasis or infiltrates.  Original Report Authenticated By: P. Loralie Champagne, M.D.   Dg Chest Port 1 View  11/26/2010  *RADIOLOGY REPORT*  Clinical Data: Central line placement  PORTABLE CHEST - 1 VIEW  Comparison: Portable exam 1650 hours compared to 1325 hours  Findings: Left subclavian venous catheter, tip projects over SVC. Endotracheal tube, tip 3.5 cm above carina. Right jugular dual-lumen central venous catheter, tip projecting over SVC. Nasogastric tube extends into stomach. Normal heart size. Tortuous aorta with atherosclerotic calcification. Bilateral airspace infiltrates, slightly increased on the left since previous exam. No pneumothorax.  IMPRESSION: No pneumothorax following central line placement. Bilateral pulmonary infiltrates greater on the right, increased left lower lobe since previous exam.  Original Report Authenticated By: Lollie Marrow, M.D.   Dg Chest Portable 1 View  12/17/2010  *RADIOLOGY REPORT*  Clinical Data: Intubated, unresponsive  PORTABLE CHEST - 1 VIEW  Comparison: 11/13/2010  Findings: Endotracheal tube tip 3.5 cm above carina.  Nasogastric tube been  placed at least as far as the stomach, tip not seen. Right IJ central line stable in position.  There are new alveolar opacities centrally in both lung bases and in the right upper lobe. No definite effusion although the lateral costophrenic angles are excluded.  Heart size upper limits normal for technique.  IMPRESSION: 1.  Endotracheal tube and nasogastric tube placement as above. 2.  New bibasilar right upper lobe alveolar opacities, possibly asymmetric edema versus infiltrates.  Original Report Authenticated By: Osa Craver, M.D.   Dg Swallowing Func-no Report  01/02/2011  CLINICAL DATA: dysphagia   FLUOROSCOPY FOR SWALLOWING FUNCTION STUDY:  Fluoroscopy was provided for swallowing function study, which was  administered by a speech pathologist.  Final results and recommendations  from this study are contained within the speech pathology report.      Medications: Scheduled Meds:    . antiseptic oral rinse  15 mL Mouth Rinse BID  . darbepoetin (ARANESP) injection - DIALYSIS  60 mcg Intravenous Q Thu-HD  . imipenem-cilastatin  250 mg Intravenous Q12H  . levalbuterol  0.63 mg Nebulization QID  . metoprolol  5 mg Intravenous Once  . paricalcitol  2 mcg Intravenous Q T,Th,Sa-HD  . vancomycin  500 mg Intravenous Q T,Th,Sa-HD   Continuous Infusions:    . dextrose 60 mL/hr at 01/04/11 1249     Assessment/Plan:  Atrial fibrillation with RVR and acute respiratory failure hypoxic: Improving today symptomatically - Continue current medical management   Recurrent aspiration pneumonia with shock: On intravenous antibiotics with vancomycin and Zosyn since 12/25/10. NPO.  - palliative goals of care meeting held today, patient still n.p.o. due to dysphagia and aspiration. Continue antibiotics until Dr. [name] has discussed the plan of management with renal service. Patient's family at this time wants to pursue complete comfort care.   VDRF in setting of cardiac arrest: extubated 12/7  with no plan for reintubation, now comfort care after palliative meeting today    Severe hypoglycemia on D10: Patient n.p.o.   Diarrhea: Likely due to antibiotics-C. difficile negative   R distal middle finger traumatic injury: continue the recommendations from the hand surgeon for care   Thrombocytopenia  Resolved   HTN -controlled at present   Acute on chronic systolic CHF Volume being handled via dialysis   ESRD: HD per renal on his usual T,Th,Sat schedule, family leaning towards discontinuing hemodialysis   Anoxic encephalopathy:  His mental status appears to be waxing and waning-this may be his new baseline mental status-we will continue to follow   Anemia  Hemoglobin is holding steady   CODE STATUS: DNR., DNI. Comfort care, he will be transitioned to palliative care unit per Dr. Elsie Stain recommendations    LOS: 22 days   Prentice Sackrider 01/04/2011, 1:35 PM

## 2011-01-04 NOTE — Progress Notes (Signed)
Subjective: Interval History: none. Appears slightly better  Objective: Vital signs in last 24 hours:  Temp:  [97.7 F (36.5 C)-98.7 F (37.1 C)] 98.5 F (36.9 C) (12/16 0509) Pulse Rate:  [105-119] 111  (12/16 0509) Resp:  [21-34] 21  (12/16 0509) BP: (97-116)/(57-74) 114/71 mmHg (12/16 0509) SpO2:  [92 %-100 %] 100 % (12/16 0847) FiO2 (%):  [100 %] 100 % (12/15 1220) Weight:  [61.4 kg (135 lb 5.8 oz)-66.5 kg (146 lb 9.7 oz)] 146 lb 9.7 oz (66.5 kg) (12/16 0509)  Weight change: -1.1 kg (-2 lb 6.8 oz)  Intake/Output: I/O last 3 completed shifts: In: 1537.3 [I.V.:1337.3; IV Piggyback:200] Out: 378 [Other:378]   Intake/Output this shift:     General: No acute respiratory distress  Lungs: rhonchi and wheezes diminshed air entry at the bases  Cardiovascular: Regular rate and rhythm without murmur gallop or rub normal S1 and S2 Perm Cath IJ  Abdomen: Nontender, nondistended, soft, bowel sounds positive, no rebound, no ascites, no appreciable mass  Extremities: No significant cyanosis, clubbing, or edema bilateral lower extremities--right middle finger is currently dressed and dry   Lab Results:  [facility] 01/03/11 0709  WBC 8.7  HGB 9.1*  HCT 27.5*  PLT 159   BMET  Basename 01/03/11 0709  NA 134*  K 3.8  CL 99  CO2 28  GLUCOSE 95  BUN 27*  CREATININE 6.73*  CALCIUM 8.3*  PHOS 3.9   LFT  Basename 01/03/11 0709  PROT --  ALBUMIN 1.7*  AST --  ALT --  ALKPHOS --  BILITOT --  BILIDIR --  IBILI --   PT/INR No results found for this basename: LABPROT:2,INR:2 in the last 72 hours Hepatitis Panel No results found for this basename: HEPBSAG,HCVAB,HEPAIGM,HEPBIGM in the last 72 hours  Studies/Results: Dg Swallowing Func-no Report  01/02/2011  CLINICAL DATA: dysphagia   FLUOROSCOPY FOR SWALLOWING FUNCTION STUDY:  Fluoroscopy was provided for swallowing function study, which was  administered by a speech pathologist.  Final results and recommendations  from  this study are contained within the speech pathology report.      I have reviewed the patient's current medications.  Assessment/Plan: ESRD- TTS dialysis ANEMIA-Aranesp  MBD- IV Vitamin D  HTN/VOL- appears stable with Norvasc  ACCESS Perm Catheter  OTHER- R middle finger consult wound care  VDRF in setting of cardiac arrest, shock with no plans to escalate care. Anoxic encephalopathy. Will plan dialysis TTS shift .Fortunately appears improved. I believe he is aspirating and this could explain the persistent fevers.       LOS: 22 Jackson [name] W @TODAY @9 :19 AM

## 2011-01-04 NOTE — Progress Notes (Addendum)
Received a call from Patient's daughter Jackson [name].  Jackson [name] joined our conference by phone and was agreeable to comfort measures.  [name] visited with her dad today and feels that he has improved and so she is retracting her acceptance of discontinuation of dialysis in favor of comfort.  I asked Jackson [name] to please gather her family tomorrow and schedule a time with our team to revisit their concerns. Jackson [name] feels that because her dad was able to ask everyone for water that he should be able to decide if he should continue dialysis.  I will schedule a time to meet with the family.  I have asked that all decisions makers be present as it is not helpful for the patient to have surrogates who are not in agreement.  [name], MD MBA The Palliative Medicine Team at [facility] Phone: [phone] Pager: [phone]

## 2011-01-04 NOTE — Progress Notes (Signed)
Spoke with Dr. Conseco who agrees that this patient would benefit from no further dialysis.  Jackson [name] will be transitioned to comfort care on the Palliative care unit and we will see what his needs are from day to day.   Jackson Proffit L. [name], MD MBA The Palliative Medicine Team at [facility] Phone: [phone] Pager: [phone]

## 2011-01-04 NOTE — Progress Notes (Signed)
ANTIBIOTIC CONSULT NOTE - FOLLOW UP  Pharmacy Consult for Vancomycin/Imipenem Indication: Recurrent Aspiration Pneumonia  Assessment: 77 yo M on D#11 vancomycin & imipenem for aspiration pneumonia.  WBC 8.7, patient afebrile. Patient remains on HD scheduled TTS. Recommend to dc antibiotic therapy if appropriate.    Goal of Therapy:  Vancomycin trough level 15-20 mcg/ml  Plan:  1. Continue Vancomycin 500mg  IV qHD session (TTS) 2. Continue Imipenem 250mg  IV q12h 3. Follow-up antibiotic plans.  Ival Bible 01/04/2011,8:19 AM  Allergies  Allergen Reactions  . Fortaz (Ceftazidime) Itching    Patient Measurements: Height: 5\' 11"  (180.3 cm) Weight: 146 lb 9.7 oz (66.5 kg) IBW/kg (Calculated) : 75.3   Vital Signs: Temp: 98.5 F (36.9 C) (12/16 0509) Temp src: Oral (12/16 0509) BP: 114/71 mmHg (12/16 0509) Pulse Rate: 111  (12/16 0509) Intake/Output from previous day: 12/15 0701 - 12/16 0700 In: 843 [I.V.:743; IV Piggyback:100] Out: 378  Intake/Output from this shift:    Labs:  [facility] 01/03/11 0709  WBC 8.7  HGB 9.1*  PLT 159  LABCREA --  CREATININE 6.73*   Estimated Creatinine Clearance: 8.2 ml/min (by C-G formula based on Cr of 6.73). No results found for this basename: VANCOTROUGH:2,VANCOPEAK:2,VANCORANDOM:2,GENTTROUGH:2,GENTPEAK:2,GENTRANDOM:2,TOBRATROUGH:2,TOBRAPEAK:2,TOBRARND:2,AMIKACINPEAK:2,AMIKACINTROU:2,AMIKACIN:2, in the last 72 hours   Microbiology: Recent Results (from the past 720 hour(s))  MRSA PCR SCREENING     Status: Normal   Collection Time   12/16/2010  4:36 PM      Component Value Range Status Comment   MRSA by PCR NEGATIVE  NEGATIVE  Final   CULTURE, RESPIRATORY     Status: Normal   Collection Time   12/15/10 11:15 AM      Component Value Range Status Comment   Specimen Description ENDOTRACHEAL   Final    Special Requests NONE   Final    Gram Stain     Final    Value: ABUNDANT WBC PRESENT, PREDOMINANTLY PMN     NO SQUAMOUS  EPITHELIAL CELLS SEEN     RARE GRAM POSITIVE COCCI IN PAIRS   Culture Non-Pathogenic Oropharyngeal-type Flora Isolated.   Final    Report Status 12/18/2010 FINAL   Final   CULTURE, BLOOD (ROUTINE X 2)     Status: Normal   Collection Time   12/16/10  1:00 PM      Component Value Range Status Comment   Specimen Description BLOOD HAND LEFT   Final    Special Requests BOTTLES DRAWN AEROBIC ONLY 3.0 CC    Final    Setup Time [id number]   Final    Culture NO GROWTH 5 DAYS   Final    Report Status 12/22/2010 FINAL   Final   CULTURE, BLOOD (ROUTINE X 2)     Status: Normal   Collection Time   12/16/10  1:10 PM      Component Value Range Status Comment   Specimen Description BLOOD HAND LEFT   Final    Special Requests BOTTLES DRAWN AEROBIC ONLY 2.0CC   Final    Setup Time [id number]   Final    Culture NO GROWTH 5 DAYS   Final    Report Status 12/22/2010 FINAL   Final   CULTURE, RESPIRATORY     Status: Normal   Collection Time   12/25/10  5:35 PM      Component Value Range Status Comment   Specimen Description TRACHEAL ASPIRATE   Final    Special Requests NONE   Final    Gram Stain  Final    Value: RARE WBC PRESENT, PREDOMINANTLY PMN     NO SQUAMOUS EPITHELIAL CELLS SEEN     NO ORGANISMS SEEN   Culture NO GROWTH 2 DAYS   Final    Report Status 12/28/2010 FINAL   Final   CULTURE, BLOOD (ROUTINE X 2)     Status: Normal   Collection Time   12/25/10  5:48 PM      Component Value Range Status Comment   Specimen Description BLOOD RIGHT HAND   Final    Special Requests BOTTLES DRAWN AEROBIC AND ANAEROBIC 10CC EACH   Final    Setup Time [id number]   Final    Culture NO GROWTH 5 DAYS   Final    Report Status 01/01/2011 FINAL   Final   CULTURE, BLOOD (ROUTINE X 2)     Status: Normal   Collection Time   12/25/10  6:20 PM      Component Value Range Status Comment   Specimen Description BLOOD RIGHT HAND   Final    Special Requests BOTTLES DRAWN AEROBIC ONLY 10CC   Final    Setup Time  [id number]   Final    Culture NO GROWTH 5 DAYS   Final    Report Status 01/01/2011 FINAL   Final   CLOSTRIDIUM DIFFICILE BY PCR     Status: Normal   Collection Time   12/25/10  9:54 PM      Component Value Range Status Comment   C difficile by pcr NEGATIVE  NEGATIVE  Final     Anti-infectives     Start     Dose/Rate Route Frequency Ordered Stop   01/03/11 1200   vancomycin (VANCOCIN) 500 mg in sodium chloride 0.9 % 100 mL IVPB        500 mg 100 mL/hr over 60 Minutes Intravenous Every T-Th-Sa (Hemodialysis) 01/01/11 1210     12/27/10 1800   vancomycin (VANCOCIN) 750 mg in sodium chloride 0.9 % 150 mL IVPB  Status:  Discontinued        750 mg 150 mL/hr over 60 Minutes Intravenous Every T-Th-Sa (Hemodialysis) 12/25/10 1621 12/25/10 1622   12/25/10 2300   metroNIDAZOLE (FLAGYL) 50 mg/ml oral suspension 500 mg  Status:  Discontinued        500 mg Per Tube 3 times daily 12/25/10 2202 12/26/10 0909   12/25/10 2145   metroNIDAZOLE (FLAGYL) tablet 500 mg  Status:  Discontinued        500 mg Oral 3 times per day 12/25/10 2142 12/25/10 2203   12/25/10 1800   vancomycin (VANCOCIN) 750 mg in sodium chloride 0.9 % 150 mL IVPB  Status:  Discontinued        750 mg 150 mL/hr over 60 Minutes Intravenous Every T-Th-Sa (Hemodialysis) 12/25/10 1622 01/01/11 1209   12/25/10 1730   imipenem-cilastatin (PRIMAXIN) 250 mg in sodium chloride 0.9 % 100 mL IVPB        250 mg 200 mL/hr over 30 Minutes Intravenous Every 12 hours 12/25/10 1626     12/25/10 1615   vancomycin (VANCOCIN) 500 mg in sodium chloride 0.9 % 100 mL IVPB  Status:  Discontinued     Comments: Consult pharmacy to dose.      500 mg 100 mL/hr over 60 Minutes Intravenous Every 12 hours 12/25/10 1607 12/25/10 1614   12/23/10 1200   vancomycin (VANCOCIN) 750 mg in sodium chloride 0.9 % 150 mL IVPB  Status:  Discontinued  750 mg 150 mL/hr over 60 Minutes Intravenous Every T-Th-Sa (Hemodialysis) 12/22/10 1027 12/24/10 0938   12/22/10  1200   vancomycin (VANCOCIN) 750 mg in sodium chloride 0.9 % 150 mL IVPB        750 mg 150 mL/hr over 60 Minutes Intravenous  Once 12/22/10 1025 12/22/10 1340   12/18/10 1800   vancomycin (VANCOCIN) 750 mg in sodium chloride 0.9 % 150 mL IVPB        750 mg 150 mL/hr over 60 Minutes Intravenous  Once 12/18/10 1043 12/18/10 1900   12/16/10 1800   vancomycin (VANCOCIN) 750 mg in sodium chloride 0.9 % 150 mL IVPB        750 mg 150 mL/hr over 60 Minutes Intravenous  Once 12/16/10 1623 12/16/10 2221   12/15/10 1500   vancomycin (VANCOCIN) 1,500 mg in sodium chloride 0.9 % 500 mL IVPB        1,500 mg 250 mL/hr over 120 Minutes Intravenous  Once 12/15/10 1343 12/15/10 1745   12/15/10 1500   piperacillin-tazobactam (ZOSYN) IVPB 2.25 g  Status:  Discontinued        2.25 g 100 mL/hr over 30 Minutes Intravenous 3 times per day 12/15/10 1343 12/24/10 1610

## 2011-01-04 NOTE — Consult Note (Signed)
Consult Note from the Palliative Medicine Team at [facility]  Consult Requested by: Dr. Isidoro Donning    PCP: Ralene Ok, MD, MD Reason for Consultation: GOC and related sx rec  Phone Number:[phone]  Assessment and Plan: 77 yr old AA male who recently suffered a cardiac arrest s/p dialysis.  The pt had a shockable rhythm that was felt to be secondary to hypokalemia.  He was able to be defibrilated and slowly since that event he has slowly recovered but appears more confused than prior to his event.  He is also noted to have severe oropharyngeal dysphagia.  Family would like to consider full comfort care with discontinuation of dialysis, and comfort 1. Code Status: DNR/DNI comfort care Need to talk with Dr. Hyman Hopes about plan of care.  Will then initiate plan if agreeable to him  2. Symptom Control: 1. Dyspnea : resolved after dialysis.  Pt does not have IV access will leave roxanol to treat further symptoms. 2. Dysphagia: spoke with speech to request eval for best texture. 3.   Aspiration Pneumonia:  Family ok with symptomatic treatment only will request dc antibiotics when I speak with Dr. Hyman Hopes 4 . Agitation /confusion will use prn benzo or haldol if needed. 3. Psycho/Social: Patient's children are from different relationships and he is described as not being an involved father.  His children, however, have always stepped up to the plate to take care of him.  They did not desire to share other pieces of information about him with me. 4. Disposition: Family is comfortable with transition to the palliative care unit.  They understand that if he does not decline further we might need to look at residential hospice which would be ideal for him.  Patient Documents Completed or Given: Document Given Completed  Advanced Directives Pkt    MOST    DNR In computer  will leave a gold form  Gone from [employer]    Hard Choices      Brief HPI: 77 yr old AA [place] male with history or ESRD on dialysis.  On  11/24 the patient returned home from dialysis and had a syncopal event .  Fire rescue arrived and applied an AED which detected a shockable rhythm.  He was defibrillated and sent to the ICU on a ventilator. Standard [facility] protocol was applied.  The patient was able to be extubated and has slowly recovered cognitive function which his children note is not back at baseline.  He is much more confused since his last event.  Yesterday, the patient was noted to be in some respiratory distress, with increased work of breathing, and runs of [place].  He is suspected to be aspirating and the family had already vocalized they did not desire a feeding tube.  I met with the eldest of the patient's children Cynethia, Kelan, Pritt and his wife Corrie Dandy.  We also spoke during goals of care with his Daughter Suzette Battiest.  He has two other young son's Woody Seller and Jonny Ruiz who have not expressed a desire to be included in making decisions for their dad,  But have been given the opportunity by their older siblings.   ROS: He denies pain.  Other ROs are limited by is tangential cognitive status.    PMH:  Past Medical History  Diagnosis Date  . Chronic systolic heart failure 09/06/2009  . ESRD on hemodialysis     Tues, Thurs, Sat  . Cardiomyopathy   . HTN (hypertension)   . Hyperlipemia   .  Anemia     Multifactorial - iron deficiency and anemia of chronic disease in setting of ESRD. Last anemia panel (07/2005 ) - Fe 16, TIBC  203, Ferritin 350.  Marland Kitchen Gout   . Tricuspid regurgitation   . Thrombus     H/o apical thrombus  . Cardiac arrest 12/09/2007    with pulseless electrical activity 2/2 severe hyperkalemia (K > 7.5)  . Thyroid disease   . COPD (chronic obstructive pulmonary disease)   . GERD (gastroesophageal reflux disease)   . Cancer     prostate  . Rhabdomyolysis     H/O - secondary to blunt trauma to the buttock   . Secondary hyperparathyroidism   . Aneurysm artery, iliac common     right, s/p percutaneous  repair 10/2009  . Chronic idiopathic thrombocytopenia     BL platelet 100-120.  Marland Kitchen Syncope     H/O multiple syncopal episodes thought to be vasovagal versus hypotension induced. Last episode  10/2009     PSH: Past Surgical History  Procedure Date  . Hernia repair   . Av fistula placement   . Jugular catheter placement   . 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 Right common iliac artery aneurysm repair 10/2009   I have reviewed the FH and SH and  If appropriate update it with new information. Allergies  Allergen Reactions  . Fortaz (Ceftazidime) Itching   Scheduled Meds:   . antiseptic oral rinse  15 mL Mouth Rinse BID  . darbepoetin (ARANESP) injection - DIALYSIS  60 mcg Intravenous Q Thu-HD  . imipenem-cilastatin  250 mg Intravenous Q12H  . levalbuterol  0.63 mg Nebulization QID  . metoprolol  1 mg Intravenous Once  . metoprolol  5 mg Intravenous Once  . paricalcitol  2 mcg Intravenous Q T,Th,Sa-HD  . vancomycin  500 mg Intravenous Q T,Th,Sa-HD   Continuous Infusions:   . dextrose 60 mL/hr at 01/04/11 0801   PRN Meds:.sodium chloride, sodium chloride, sodium chloride, sodium chloride, acetaminophen, camphor-menthol, feeding supplement (NEPRO CARB STEADY), feeding supplement (NEPRO CARB STEADY), food thickener, heparin, heparin, heparin, heparin, heparin, hydrALAZINE, levalbuterol, lidocaine, lidocaine, lidocaine-prilocaine, lidocaine-prilocaine, loperamide, morphine injection, pentafluoroprop-tetrafluoroeth, pentafluoroprop-tetrafluoroeth, traMADol    BP 114/71  Pulse 111  Temp(Src) 98.5 F (36.9 C) (Oral)  Resp 21  Ht 5\' 11"  (1.803 m)  Wt 66.5 kg (146 lb 9.7 oz)  BMI 20.45 kg/m2  SpO2 100%   PPS:40%   Intake/Output Summary (Last 24 hours) at 01/04/11 1159 Last data filed at 01/03/11 1829  Gross per 24 hour  Intake    843 ml  Output      0 ml  Net    843 ml   LBM:12/15                    Stool Softner: No  Physical Exam:  General:  Awake, alert but not oriented to time or place.  He was able to recall slowly the names of family HEENT:  PEERL, EOMI, anicteric,  Mm dry with poor dentition Chest:   Decreased with some crackes at bilateral bases. CVS: distant , tachycardic, S1, S2 Abdomen:soft, not tender or distended Ext: loss of left thumb to trauma Neuro:oriented to his family, but not time.  Confabulates some of his answers.  Labs: CBC    Component Value Date/Time   WBC 8.7 01/03/2011 0709   RBC 3.17* 01/03/2011 0709   HGB 9.1* 01/03/2011 0709   HCT 27.5* 01/03/2011 9604  PLT 159 01/03/2011 0709   MCV 86.8 01/03/2011 0709   MCH 28.7 01/03/2011 0709   MCHC 33.1 01/03/2011 0709   RDW 16.9* 01/03/2011 0709   LYMPHSABS 3.4 11/30/2010 1257   MONOABS 0.4 12/04/2010 1257   EOSABS 0.3 12/11/2010 1257   BASOSABS 0.1 11/23/2010 1257       CMP     Component Value Date/Time   NA 134* 01/03/2011 0709   K 3.8 01/03/2011 0709   CL 99 01/03/2011 0709   CO2 28 01/03/2011 0709   GLUCOSE 95 01/03/2011 0709   BUN 27* 01/03/2011 0709   CREATININE 6.73* 01/03/2011 0709   CALCIUM 8.3* 01/03/2011 0709   PROT 5.1* 12/16/2010 0700   ALBUMIN 1.7* 01/03/2011 0709   AST 27 12/16/2010 0700   ALT 22 12/16/2010 0700   ALKPHOS 137* 12/16/2010 0700   BILITOT 0.5 12/16/2010 0700   GFRNONAA 7* 01/03/2011 0709   GFRAA 8* 01/03/2011 0709    Chest Xray Reviewed/Impressions: "IMPRESSION:  Persistent retrocardiac collapse / consolidation with effusion.  Improved aeration at the right base. "     Time In Time Out Total Time Spent with Patient Total Overall Time  10:30 11:45 20 min 75 min    Greater than 50%  of this time was spent counseling and coordinating care related to the above assessment and plan.   I have discussed the case with Dr. Isidoro Donning and await a call back from Dr. Hedy Camara L. [name], MD MBA The Palliative Medicine Team at [facility] Phone: [phone] Pager: [phone]

## 2011-01-04 NOTE — Progress Notes (Signed)
Speech Language/Pathology Patient seen for diagnostic dysphagia treatment to recommend safest PO diet with necessary precautions to maximize safety for comfort feeds.  MBS completed on 01-02-11 indicates dysphagia with recommendations of NPO with temporary means of nutrition/hydration due to severe risk for aspiration. Following Palliative Care Meeting this date family's decision is to proceed with comfort feeds with known risks.  Treatment completed with family members present. Patient alert and cooperative with no reports of pain.  Oral care completed pre treatment by SLP. Patient administered trials of ice chips, thin water by cup, puree, and mechanical soft consistency. Effective cough noted after trial of mechanical soft and thin liquid by cup due to too large of sip. Recommend the following POC:  Dysphagia One (puree)  Thin liquid by cup only  Aspiration precautions  Oral care before and after meals  Full supervision with all meals ST to follow 2x weekly for 2 weeks to focus on maximizing safety by providing education to family and caregivers concerning recommended diet texture, compensatory swallow strategies, and signs/symptoms of aspiration. Even with modified diet and aspiration precautions patient continues to be at risk for aspiration. Moreen Fowler M.S., CCC-SLP [phone]

## 2011-01-05 LAB — GLUCOSE, CAPILLARY
Glucose-Capillary: 77 mg/dL (ref 70–99)
Glucose-Capillary: 91 mg/dL (ref 70–99)
Glucose-Capillary: 76 mg/dL (ref 70–99)

## 2011-01-05 NOTE — Progress Notes (Signed)
Jackson [name]  AVW:[id number]  DOB: [birth year]  DOA: 12/31/2010  Interval history:   77 y/o AAM with ESRD on HD who had dialysis the morning of his admission and had syncope at home afterwards. According to EMS, the patient had an AED applied by the fire department who responded first which advised a shockable rhythm. He was shocked one time. EMS gave him 1 mg of epinephrine. He was intubated orally at the scene. He also had an EJ established at the scene as well. Sinus rhythm with multiple premature beats was established with palpable blood pressure. He was initially admitted to the critical care service due to his ventilator dependent status. He was transferred to [facility] on December 9.  Subjective: Patient seen today, lying in the bed, denies any complaints.   Objective: Weight change:   Intake/Output Summary (Last 24 hours) at 01/05/11 1550 Last data filed at 01/05/11 1430  Gross per 24 hour  Intake   2613 ml  Output      0 ml  Net   2613 ml   Blood pressure 129/79, pulse 90, temperature 97 F (36.1 C), temperature source Oral, resp. rate 20, height 5\' 11"  (1.803 m), weight 66.5 kg (146 lb 9.7 oz), SpO2 99.00%.  Physical Exam: General: Alert, congestion HEENT: anicteric sclera, pupils reactive to light and accommodation, EOMI CVS: S1-S2 clear, no murmur rubs or gallops, irregular Chest: Congested bilaterally Abdomen: soft nontender, nondistended, normal bowel sounds, no organomegaly Extremities: no cyanosis, clubbing or edema noted bilaterally   Lab Results: Basic Metabolic Panel:  Lab 01/03/11 8295 12/30/10 0730  NA 134* 131*  K 3.8 4.1  CL 99 95*  CO2 28 23  GLUCOSE 95 73  BUN 27* 28*  CREATININE 6.73* 5.68*  CALCIUM 8.3* 8.4  MG -- --  PHOS 3.9 --   Liver Function Tests:  Lab 01/03/11 0709 12/30/10 0730  AST -- --  ALT -- --  ALKPHOS -- --  BILITOT -- --  PROT -- --  ALBUMIN 1.7* 1.9*     Lab 01/03/11 0709 01/01/11 0755  WBC 8.7  4.8  NEUTROABS -- --  HGB 9.1* 9.1*  HCT 27.5* 27.5*  MCV 86.8 86.8  PLT 159 177   CBG:  Lab 01/05/11 1236 01/05/11 0816 01/05/11 0458 01/05/11 0044 01/04/11 2048  GLUCAP 85 79 77 76 90     Micro Results: Recent Results (from the past 240 hour(s))  CULTURE, BLOOD (ROUTINE X 2)     Status: Normal (Preliminary result)   Collection Time   01/03/11  8:40 AM      Component Value Range Status Comment   Specimen Description BLOOD HEMODIALYSIS CATHETER   Final    Special Requests     Final    Value: BOTTLES DRAWN AEROBIC AND ANAEROBIC PT ON VANC PRIMAXCIN   Setup Time [id number]   Final    Culture     Final    Value:        BLOOD CULTURE RECEIVED NO GROWTH TO DATE CULTURE WILL BE HELD FOR 5 DAYS BEFORE ISSUING A FINAL NEGATIVE REPORT   Report Status PENDING   Incomplete   CULTURE, BLOOD (ROUTINE X 2)     Status: Normal (Preliminary result)   Collection Time   01/03/11  9:05 AM      Component Value Range Status Comment   Specimen Description BLOOD HEMODIALYSIS CATHETER   Final    Special Requests BOTTLES DRAWN AEROBIC AND ANAEROBIC  Final    Setup Time [id number]   Final    Culture     Final    Value:        BLOOD CULTURE RECEIVED NO GROWTH TO DATE CULTURE WILL BE HELD FOR 5 DAYS BEFORE ISSUING A FINAL NEGATIVE REPORT   Report Status PENDING   Incomplete     Studies/Results: Ct Head Wo Contrast  Dec 28, 2010  *RADIOLOGY REPORT*  Clinical Data: Syncope.  Post cardiac arrest.  End-stage renal disease.  CT HEAD WITHOUT CONTRAST  Technique:  Contiguous axial images were obtained from the base of the skull through the vertex without contrast.  Comparison: 10/09/2008.  Findings: No intracranial hemorrhage.  Prominent small vessel disease type changes.  Remote right thalamic tiny infarct.  No CT evidence of large acute infarct.  Small acute infarct cannot be excluded by CT.  No intracranial mass lesion detected on this unenhanced exam.  Global atrophy without hydrocephalus.   Vascular calcifications.  Air-fluid level right sphenoid sinus.  IMPRESSION: No intracranial hemorrhage or CT evidence of large acute infarct.  Remote small right thalamic infarct.  Prominent small vessel disease type changes.  Global atrophy.  Air-fluid level right sphenoid sinus.  Original Report Authenticated By: Fuller Canada, M.D.   Mr Brain Wo Contrast  12/30/2010  *RADIOLOGY REPORT*  Clinical Data: Slurred speech.  Renal failure.  Syncope.  MRI HEAD WITHOUT CONTRAST  Technique:  Multiplanar, multiecho pulse sequences of the brain and surrounding structures were obtained according to standard protocol without intravenous contrast.  Comparison: CT 12/28/2010  Findings: Diffusion imaging does not show any acute or subacute infarction.  There is advanced generalized brain atrophy.  There are old small vessel infarctions within the cerebellum and there is extensive chronic small vessel disease throughout the cerebral hemispheres including the thalami, basal ganglia and hemispheric white matter.  No evidence of mass lesion, acute hemorrhage, hydrocephalus or extra-axial collection.  There are scattered punctate foci of hemosiderin deposition consistent with old small vessel infarctions.  No pituitary mass.  There are inflammatory changes of the sphenoid sinus.  IMPRESSION: No acute or reversible findings.  Advanced generalized brain atrophy and chronic small vessel disease throughout the brain.  Inflammatory changes of the sphenoid sinus.  Original Report Authenticated By: Thomasenia Sales, M.D.   Dg Hand 2 View Right  12/28/2010  *RADIOLOGY REPORT*  Clinical Data: Middle finger pain and swelling.  RIGHT HAND - 2 VIEW  Comparison: None.  Findings: Soft tissue swelling is seen involving the distal middle finger.  There is no evidence of fracture or bone destruction.  No evidence of soft tissue gas or radiopaque foreign body.  No evidence of arthritis or other significant bone abnormality.  IMPRESSION: Soft  tissue swelling of the distal little finger.  No osseous abnormality.  Original Report Authenticated By: Danae Orleans, M.D.   Dg Chest Port 1 View  01/01/2011  *RADIOLOGY REPORT*  Clinical Data: Coughing and rhonchi.  PORTABLE CHEST - 1 VIEW  Comparison: 12/30/2010  Findings: The cardiopericardial silhouette is enlarged.  The retrocardiac collapse / consolidation with left effusion persists. There is some improved aeration at the right base.  Right IJ dialysis catheter remains in place. Telemetry leads overlie the chest.  IMPRESSION: Persistent retrocardiac collapse / consolidation with effusion.  Improved aeration at the right base.  Original Report Authenticated By: ERIC A. MANSELL, M.D.   Dg Chest Port 1 View  12/30/2010  *RADIOLOGY REPORT*  Clinical Data: Shortness of breath.  PORTABLE CHEST -  1 VIEW  Comparison: Chest x-ray 12/26/2010.  Findings: The endotracheal  and NG tubes have been removed.  The right IJ catheter is stable.  The left subclavian central venous catheter is stable.  There are persistent bilateral infiltrates and a left pleural effusion.  Persistent central vascular congestion. Probable left pleural effusion.  IMPRESSION:  1. 1.  Removal of ET and NG tubes. 2.  Persistent central vascular congestion and patchy infiltrates. 3.  Probable left pleural effusion.  Original Report Authenticated By: P. Loralie Champagne, M.D.   Dg Chest Port 1 View  12/29/2010  *RADIOLOGY REPORT*  Clinical Data: Heart failure.  Intubated.  PORTABLE CHEST - 1 VIEW  Comparison: 12/25/2010  Findings: The endotracheal tube is in good position at the level of the aortic arch.  NG tube tip is below the diaphragm.  Left central venous catheter tip is in the superior vena cava.  Double-lumen right central catheter tips are at the cavoatrial junction.  Improved atelectasis at the right base.  Vascularity has improved and is now normal.    Persistent left effusion and atelectasis / consolidation at the left base  although aeration has slightly improved.  IMPRESSION:  Improved aeration at both bases.  However, there is persistent density particularly on the left consistent with effusion, atelectasis, and probable consolidation in the left lower lobe.  Original Report Authenticated By: Gwynn Burly, M.D.   Dg Chest Port 1 View  12/25/2010  *RADIOLOGY REPORT*  Clinical Data: Endotracheal tube placement, shortness of breath  PORTABLE CHEST - 1 VIEW  Comparison: Portable chest x-ray of 12/24/2010  Findings: There has been an increase in opacity at the lung bases. This may reflect atelectasis and effusions, but pneumonia cannot be excluded.  Cardiomegaly is stable.  Endotracheal tube, left central venous line, and right central venous line are unchanged in position.  IMPRESSION: Increase in basilar opacities left greater than right.  Possible atelectasis and effusions but cannot exclude pneumonia.  Original Report Authenticated By: Juline Patch, M.D.   Dg Chest Port 1 View  12/24/2010  *RADIOLOGY REPORT*  Clinical Data: Assess endotracheal tube.  PORTABLE CHEST - 1 VIEW  Comparison: 12/23/2010.  Findings: Endotracheal tube, nasogastric tube and left subclavian central line appear unchanged.  Right IJ dialysis catheter again noted.  Dense retrocardiac density is present compatible with atelectasis, airspace disease and left pleural effusion.  Slight worsening of aeration at the right lung base probably represents either edema or atelectasis.  This may represent a combination of both.  IMPRESSION:  1.  Stable support apparatus in good position. 2.  Worsening of aeration at the right lung base, likely increasing atelectasis.  Original Report Authenticated By: Andreas Newport, M.D.   Dg Chest Port 1 View  12/23/2010  *RADIOLOGY REPORT*  Clinical Data: Endotracheal tube placement  PORTABLE CHEST - 1 VIEW  Comparison: Portable chest x-ray of 12/22/2010  Findings: The tip of the endotracheal tube is approximately 3.8 cm  above the carina.  Aeration has improved with improvement in pulmonary vascular congestion as well.  There is still persistent opacity at the left lung base which may represent atelectasis, effusion, or possibly pneumonia.  Mild cardiomegaly is stable. Left and right central venous lines remain.  IMPRESSION:  1.  Improved aeration and improved pulmonary vascular congestion. 2.  Tip of endotracheal tube 3.8 cm above carina. 3.  Persistent left basilar opacity.  Original Report Authenticated By: Juline Patch, M.D.   Dg Chest Port 1 View  12/22/2010  *  RADIOLOGY REPORT*  Clinical Data: Endotracheal tube placement  PORTABLE CHEST - 1 VIEW  Comparison: 12/21/2010  Findings: Cardiomediastinal silhouette is stable.  Slight worsening congestion/pulmonary edema.  Stable endotracheal tube position with tip 5.2 cm above the carina.  Right dialysis catheter is unchanged in position.  Stable left subclavian central line position. Probable bilateral small pleural effusion and bilateral basilar atelectasis or infiltrate.  IMPRESSION:  Slight worsening congestion/pulmonary edema.  Stable endotracheal tube position with tip 5.2 cm above the carina.  Right dialysis catheter is unchanged in position.  Stable left subclavian central line position.  Probable bilateral small pleural effusion and bilateral basilar atelectasis or infiltrate.  Original Report Authenticated By: Natasha Mead, M.D.   Dg Chest Port 1 View  12/21/2010  *RADIOLOGY REPORT*  Clinical Data: Endotracheal tube placement.  PORTABLE CHEST - 1 VIEW  Comparison: 12/20/2010  Findings: Endotracheal tube is 4.5 cm above the carina.  Improved aeration and probably decreased edema in the lower lungs.  Dialysis catheter tip near the cavoatrial junction.  Left subclavian central line in the upper SVC region.  Heart size is stable.  IMPRESSION: Improved aeration at the lung bases suggests decreased pulmonary edema.  Original Report Authenticated By: Richarda Overlie, M.D.   Dg  Chest Port 1 View  12/20/2010  *RADIOLOGY REPORT*  Clinical Data: Perforation, ventilatory support, renal failure  PORTABLE CHEST - 1 VIEW  Comparison: 12/19/2010  Findings: Endotracheal tube 2.8 cm above the carina.  Bilateral central lines and NG tube stable position.  Monitor leads overlie the chest.  Heart remains enlarged with worsening diffuse pulmonary edema pattern noted.  Pleural effusions present bilaterally with basilar atelectasis / consolidation, worse in the left lower lobe. No pneumothorax.  IMPRESSION: Worsening CHF pattern.  Original Report Authenticated By: Judie Petit. Ruel Favors, M.D.   Dg Chest Port 1 View  12/19/2010  *RADIOLOGY REPORT*  Clinical Data: Assess endotracheal tube.  PORTABLE CHEST - 1 VIEW  Comparison: 12/18/2010.  Findings: Endotracheal tube tip 4 cm above the carina.  Right sided split catheter with tips in the region of the distal superior vena cava.  Left central line tip mid superior vena cava level. Nasogastric tube courses below the diaphragm.  The tip is not included on this exam.  No gross pneumothorax.  Cardiomegaly.  Central pulmonary vascular prominence/mild pulmonary edema.  Calcified tortuous aorta.  Bibasilar patchy opacity may represent atelectasis and crowding of vessels although infectious infiltrate not excluded.  IMPRESSION: No significant change.  Original Report Authenticated By: Fuller Canada, M.D.   Dg Chest Port 1 View  12/18/2010  *RADIOLOGY REPORT*  Clinical Data: Evaluate endotracheal tube position  PORTABLE CHEST - 1 VIEW  Comparison: Portable chest x-ray of 12/17/2010  Findings: The tip of the endotracheal tube is approximately 8-0.6 cm above the carina.  There is evidence of pulmonary vascular congestion present with probable small effusions.  Cardiomegaly is stable.  Left and right central venous lines remain.  IMPRESSION:  1.  Endotracheal tip approximately 2.6 cm above the carina. 2.  Little change in probable mild pulmonary vascular congestion  and small effusions.  Original Report Authenticated By: Juline Patch, M.D.   Dg Chest Port 1 View  12/17/2010  *RADIOLOGY REPORT*  Clinical Data: ET tube placement, shortness of breath.  PORTABLE CHEST - 1 VIEW  Comparison: None.  Findings: Diffuse interstitial prominence throughout the lungs, likely mild edema.  Heart is borderline in size.  There is bibasilar atelectasis or infiltrates, increased since prior study.  IMPRESSION: Increasing bibasilar atelectasis or infiltrates.  Increasing interstitial prominence, likely interstitial edema.  Original Report Authenticated By: Cyndie Chime, M.D.   Dg Chest Port 1 View  12/16/2010  *RADIOLOGY REPORT*  Clinical Data: Query aspiration.  PORTABLE CHEST - 1 VIEW  Comparison: 12/16/2010 at 5:33 a.m.  Findings: The endotracheal tube tip is approximately 2 cm above the carina.  Nasogastric tube is in place.  A right internal jugular central venous catheter is noted with tip at the cavoatrial junction.  Left central line tip projects over the SVC.  Airspace opacity noted in the left lower lobe.  There is hazy opacity along the right hemithorax but with improved aeration of the right lower lobe.  IMPRESSION: 1.  Improved aeration of the right lower lobe, with hazy opacity over the right hemithorax which could reflect a layering pleural effusion.  2.  Continued airspace opacity in the left lower lobe, possibly pneumonia or atelectasis. Aspiration pneumonitis cannot be excluded, although the endotracheal tube may have a protective effect against new aspiration.  3.  The endotracheal tube has been retracted and its tip is 2 cm above the carina.  Original Report Authenticated By: Dellia Cloud, M.D.   Dg Chest Port 1 View  12/16/2010  *RADIOLOGY REPORT*  Clinical Data: Respiratory failure.  PORTABLE CHEST - 1 VIEW  Comparison: 12/15/2010.  Findings: Endotracheal tube tip is at the level of the carina. Recommend retracting endotracheal tube tip by 3 cm.  Right  central line tip distal superior vena cava level.  Left central line tip mid superior vena cava level.  Cardiomegaly.  Asymmetric air space disease most notable in the lung bases.  This may represent pulmonary edema although infiltrate not excluded in the proper clinical setting.  No gross pneumothorax.  Nasogastric tube courses below the diaphragm.  The tip is not included on this exam.  IMPRESSION: Endotracheal tube tip is at the level of the carina.   Recommend retracting endotracheal tube tip by 3 cm.  Asymmetric air space disease most notable lung bases without significant change.  Question pulmonary edema and / or infectious infiltrate?  Original Report Authenticated By: Fuller Canada, M.D.   Dg Chest Port 1 View  12/15/2010  *RADIOLOGY REPORT*  Clinical Data: Respiratory failure.  PORTABLE CHEST - 1 VIEW  Comparison: Chest x-ray 11/30/2010.  Findings: The endotracheal tube is 13 mm above the carina and should be retracted 2 cm. The remaining support apparatus is stable.  The cardiac silhouette, mediastinal and hilar contours are stable.  The lungs demonstrate slight improved aeration with resolving edema and atelectasis.  IMPRESSION:  1.  The endotracheal tube is 13 mm above the carina and should be retracted 2 cm. 2.  The remaining support apparatus is stable. 3.  Slight improved lung aeration with resolving edema and atelectasis or infiltrates.  Original Report Authenticated By: P. Loralie Champagne, M.D.   Dg Chest Port 1 View  11/26/2010  *RADIOLOGY REPORT*  Clinical Data: Central line placement  PORTABLE CHEST - 1 VIEW  Comparison: Portable exam 1650 hours compared to 1325 hours  Findings: Left subclavian venous catheter, tip projects over SVC. Endotracheal tube, tip 3.5 cm above carina. Right jugular dual-lumen central venous catheter, tip projecting over SVC. Nasogastric tube extends into stomach. Normal heart size. Tortuous aorta with atherosclerotic calcification. Bilateral airspace  infiltrates, slightly increased on the left since previous exam. No pneumothorax.  IMPRESSION: No pneumothorax following central line placement. Bilateral pulmonary infiltrates greater on the right, increased  left lower lobe since previous exam.  Original Report Authenticated By: Lollie Marrow, M.D.   Dg Chest Portable 1 View  12/06/2010  *RADIOLOGY REPORT*  Clinical Data: Intubated, unresponsive  PORTABLE CHEST - 1 VIEW  Comparison: 11/13/2010  Findings: Endotracheal tube tip 3.5 cm above carina.  Nasogastric tube been placed at least as far as the stomach, tip not seen. Right IJ central line stable in position.  There are new alveolar opacities centrally in both lung bases and in the right upper lobe. No definite effusion although the lateral costophrenic angles are excluded.  Heart size upper limits normal for technique.  IMPRESSION: 1.  Endotracheal tube and nasogastric tube placement as above. 2.  New bibasilar right upper lobe alveolar opacities, possibly asymmetric edema versus infiltrates.  Original Report Authenticated By: Osa Craver, M.D.   Dg Swallowing Func-no Report  01/02/2011  CLINICAL DATA: dysphagia   FLUOROSCOPY FOR SWALLOWING FUNCTION STUDY:  Fluoroscopy was provided for swallowing function study, which was  administered by a speech pathologist.  Final results and recommendations  from this study are contained within the speech pathology report.      Medications: Scheduled Meds:    . antiseptic oral rinse  15 mL Mouth Rinse BID  . levalbuterol  0.63 mg Nebulization QID  . DISCONTD: darbepoetin (ARANESP) injection - DIALYSIS  60 mcg Intravenous Q Thu-HD  . DISCONTD: imipenem-cilastatin  250 mg Intravenous Q12H  . DISCONTD: metoprolol  5 mg Intravenous Once  . DISCONTD: paricalcitol  2 mcg Intravenous Q T,Th,Sa-HD  . DISCONTD: vancomycin  500 mg Intravenous Q T,Th,Sa-HD   Continuous Infusions:    . dextrose    . DISCONTD: dextrose 60 mL/hr at 01/04/11 1249      Assessment/Plan:  Atrial fibrillation with RVR and acute respiratory failure hypoxic:  - Continue symptom management    Recurrent aspiration pneumonia with shock: On intravenous antibiotics with vancomycin and Zosyn since 12/25/10, discontinued yesterday.  - Per speech therapy started on pured diet/comfort feeds   VDRF in setting of cardiac arrest: extubated 12/7 with no plan for reintubation, now comfort care after palliative meeting today    Severe hypoglycemia on D10: Patient n.p.o.   Diarrhea: Likely due to antibiotics-C. difficile negative   R distal middle finger traumatic injury: continue the recommendations from the hand surgeon for care   Thrombocytopenia  Resolved   HTN -controlled at present   Acute on chronic systolic CHF Volume being handled via dialysis   ESRD: HD per renal on his usual T,Th,Sat schedule, family leaning towards discontinuing hemodialysis   Anoxic encephalopathy:  His mental status appears to be waxing and waning-this may be his new baseline mental status-we will continue to follow   Anemia  Hemoglobin is holding steady   CODE STATUS: DNR., DNI. Comfort care, yesterday, but patient's daughter now retracting decision on comfort care. New palliative meeting in process.   LOS: 23 days   Dorissa Stinnette 01/05/2011, 3:50 PM

## 2011-01-05 NOTE — Progress Notes (Signed)
Utilization Review Completed.Jackson [name] T12/17/2012   

## 2011-01-05 NOTE — Progress Notes (Signed)
CSW reviewed chart which stated patient was moved to PCU but dtr is now questioning dialysis. Per chart, PMT is going to have another meeting with family. CSW will follow up with family after PMT meeting. Dtr has offers for SNF or CSW can assist with residential hospice placement if needed. CSW will follow up after PMT meeting. [place], [place] [phone]

## 2011-01-06 ENCOUNTER — Inpatient Hospital Stay (HOSPITAL_COMMUNITY): Payer: Medicare Other

## 2011-01-06 LAB — CBC
MCHC: 33.6 g/dL (ref 30.0–36.0)
MCV: 85 fL (ref 78.0–100.0)
Platelets: 189 10*3/uL (ref 150–400)
RDW: 16.8 % — ABNORMAL HIGH (ref 11.5–15.5)
WBC: 5.4 10*3/uL (ref 4.0–10.5)

## 2011-01-06 LAB — GLUCOSE, CAPILLARY

## 2011-01-06 LAB — RENAL FUNCTION PANEL
Albumin: 1.6 g/dL — ABNORMAL LOW (ref 3.5–5.2)
Chloride: 97 mEq/L (ref 96–112)
Creatinine, Ser: 7.15 mg/dL — ABNORMAL HIGH (ref 0.50–1.35)
GFR calc non Af Amer: 6 mL/min — ABNORMAL LOW (ref >90)
Phosphorus: 3.8 mg/dL (ref 2.3–4.6)
Potassium: 3.6 mEq/L (ref 3.5–5.1)

## 2011-01-06 MED ORDER — HEPARIN SODIUM (PORCINE) 1000 UNIT/ML DIALYSIS: 1000.0000 [IU] | INTRAMUSCULAR | Status: DC | PRN

## 2011-01-06 MED ORDER — SODIUM CHLORIDE 0.9 % IV SOLN: 100.0000 mL | INTRAVENOUS | Status: DC | PRN

## 2011-01-06 MED ORDER — DEXTROSE 50 % IV SOLN
INTRAVENOUS | Status: AC
  Administered 2011-01-06: 16:00:00
  Filled 2011-01-06: qty 50

## 2011-01-06 MED ORDER — NEPRO/CARBSTEADY PO LIQD
237.0000 mL | ORAL | Status: DC | PRN
  Filled 2011-01-06: qty 237

## 2011-01-06 MED ORDER — LIDOCAINE-PRILOCAINE 2.5-2.5 % EX CREA
1.0000 "application " | TOPICAL_CREAM | CUTANEOUS | Status: DC | PRN
  Filled 2011-01-06: qty 5

## 2011-01-06 MED ORDER — LEVALBUTEROL HCL 0.63 MG/3ML IN NEBU
0.6300 mg | INHALATION_SOLUTION | Freq: Three times a day (TID) | RESPIRATORY_TRACT | Status: DC
  Administered 2011-01-06 – 2011-01-10 (×9): 0.63 mg via RESPIRATORY_TRACT
  Filled 2011-01-06 (×14): qty 3

## 2011-01-06 MED ORDER — ALTEPLASE 2 MG IJ SOLR: 2.0000 mg | Freq: Once | INTRAMUSCULAR | Status: AC | PRN

## 2011-01-06 MED ORDER — HEPARIN SODIUM (PORCINE) 1000 UNIT/ML DIALYSIS
20.0000 [IU]/kg | INTRAMUSCULAR | Status: DC | PRN
  Administered 2011-01-06: 1300 [IU] via INTRAVENOUS_CENTRAL

## 2011-01-06 MED ORDER — LIDOCAINE HCL (PF) 1 % IJ SOLN
5.0000 mL | INTRAMUSCULAR | Status: DC | PRN
  Filled 2011-01-06: qty 5

## 2011-01-06 MED ORDER — PENTAFLUOROPROP-TETRAFLUOROETH EX AERO: 1.0000 "application " | INHALATION_SPRAY | CUTANEOUS | Status: DC | PRN

## 2011-01-06 NOTE — Progress Notes (Signed)
CSW spoke with dtr at bedside. CSW explained the importance of choosing a facility because patient would be ready for dc soon. Dtr is still deciding between [facility] and [facility]. Dtr stated she would speak with family and would make a decision. CSW will continue to follow. [place], [place] [phone]

## 2011-01-06 NOTE — Progress Notes (Signed)
Late entry for 01/05/11  Progress Note from the Palliative Medicine Team at [facility]  Subjective: Patient is very confused today.  Will not eat for any of the staff.  He does not have the capacity for decision making.  I met with his Daughter Jackson [name] who feels that stopping dialysis is wrong despite all of her fathers problems.  Her older siblings despite stating that they would make decisions for him are now acquiescing to their sister.  Jackson [name] can to represent the older children's interests. They would like to resume dialysis but favor comfort otherwise.  Jackson [name] states that until Jackson [name] states he does not want any further dialysis or the nephrologists tell them that he can't do further dialysis ,  They will not be stopping his treatments.    bjective: Allergies  Allergen Reactions  . Fortaz (Ceftazidime) Itching   Scheduled Meds:   . antiseptic oral rinse  15 mL Mouth Rinse BID  . levalbuterol  0.63 mg Nebulization QID   Continuous Infusions:   . dextrose     PRN Meds:.acetaminophen, camphor-menthol, food thickener, hydrALAZINE, levalbuterol, loperamide, morphine injection, traMADol  BP 129/79  Pulse 90  Temp(Src) 97 F (36.1 C) (Oral)  Resp 20  Ht 5\' 11"  (1.803 m)  Wt 66.5 kg (146 lb 9.7 oz)  BMI 20.45 kg/m2  SpO2 93%  % PPS:20  Pain Score:0  Pain Location none   Intake/Output Summary (Last 24 hours) at 01/06/11 0038 Last data filed at 01/05/11 1430  Gross per 24 hour  Intake   1620 ml  Output      0 ml  Net   1620 ml      LBM:01/04/11  Stool Softner: no  Physical Exam:  General: Genrally cachectic elderly mail who is confused but pleasant.  NAD HEENT:  PERRL, EOMI, anicteric endentulous Chest:  Decreased with crackles at the bases CVS: tachy,  No murmurs , R,G, _+S1 and S2 Abdomen: soft , nt , nd Ext: ampuatated left thumb Neuro: awakens but is not in any  Distress,  He is unable to tell me about his day yesterday or  today;  Labs: CBC   BMET   CMP     Component Value Date/Time   NA 134* 01/03/2011 0709   K 3.8 01/03/2011 0709   CL 99 01/03/2011 0709   CO2 28 01/03/2011 0709   GLUCOSE 95 01/03/2011 0709   BUN 27* 01/03/2011 0709   CREATININE 6.73* 01/03/2011 0709   CALCIUM 8.3* 01/03/2011 0709   PROT 5.1* 12/16/2010 0700   ALBUMIN 1.7* 01/03/2011 0709   AST 27 12/16/2010 0700   ALT 22 12/16/2010 0700   ALKPHOS 137* 12/16/2010 0700   BILITOT 0.5 12/16/2010 0700   GFRNONAA 7* 01/03/2011 0709   GFRAA 8* 01/03/2011 0709    Assessment and Plan: 1. Code Status:DNR family desires to continue dialysis .  I contacted Dr. Caryn Section to update him on the change in plan of care.  I will get hold of Dr. Isidoro Donning in am to update. 2. Symptom Control: 1. Currently, minimally short of breathe.  Continue prn morphine 2. Agitation : general can redirect.  Will continue to evaluate the plan 3. Disposition: discussed at length with Jackson [name]:  Pt will likely need to be in [facility].  Patient Documents Completed or Given: Document Given Completed  Advanced Directives Pkt    MOST    DNR    Gone from My Sight    Hard Choices  Time In Time Out Total Time Spent with Patient Total Overall Time  630 pm/ am time 15 min 715 PM 15 min 60 min    Greater than 50%  of this time was spent counseling and coordinating care related to the above assessment and plan.  Jackson [name] L. [name], MD MBA The Palliative Medicine Team at [facility] Phone: [phone] Pager: [phone]   1

## 2011-01-06 NOTE — Progress Notes (Signed)
Jackson [name]  YQM:[id number]  DOB: 10/24/1928  DOA: 01/10/2011  Interval history:   77 y/o AAM with ESRD on HD who had dialysis the morning of his admission and had syncope at home afterwards. According to EMS, the patient had an AED applied by the fire department who responded first which advised a shockable rhythm. He was shocked one time. EMS gave him 1 mg of epinephrine. He was intubated orally at the scene. He also had an EJ established at the scene as well. Sinus rhythm with multiple premature beats was established with palpable blood pressure. He was initially admitted to the critical care service due to his ventilator dependent status. He was transferred to [facility] on December 9.  Subjective: Patient without any complaints today.     Objective: Weight change:   Intake/Output Summary (Last 24 hours) at 01/06/11 1441 Last data filed at 01/06/11 1147  Gross per 24 hour  Intake      0 ml  Output   1705 ml  Net  -1705 ml   Blood pressure 148/68, pulse 91, temperature 97 F (36.1 C), temperature source Oral, resp. rate 19, height 5\' 11"  (1.803 m), weight 64.1 kg (141 lb 5 oz), SpO2 94.00%.  Physical Exam: General: Alert, congestion HEENT: anicteric sclera, pupils reactive to light and accommodation, EOMI CVS: S1-S2 clear, no murmur rubs or gallops, irregular Chest: Congested bilaterally Abdomen: soft nontender, nondistended, normal bowel sounds, no organomegaly Extremities: no cyanosis, clubbing or edema noted bilaterally   Lab Results: Basic Metabolic Panel:  Lab 01/06/11 5284 01/03/11 0709  NA 133* 134*  K 3.6 3.8  CL 97 99  CO2 27 28  GLUCOSE 77 95  BUN 26* 27*  CREATININE 7.15* 6.73*  CALCIUM 8.3* 8.3*  MG -- --  PHOS 3.8 --   Liver Function Tests:  Lab 01/06/11 0823 01/03/11 0709  AST -- --  ALT -- --  ALKPHOS -- --  BILITOT -- --  PROT -- --  ALBUMIN 1.6* 1.7*     Lab 01/06/11 0823 01/03/11 0709  WBC 5.4 8.7  NEUTROABS -- --   HGB 8.4* 9.1*  HCT 25.0* 27.5*  MCV 85.0 86.8  PLT 189 159   CBG:  Lab 01/06/11 1241 01/06/11 0531 01/05/11 2317 01/05/11 1708 01/05/11 1236  GLUCAP 56* 74 76 91 85     Micro Results: Recent Results (from the past 240 hour(s))  CULTURE, BLOOD (ROUTINE X 2)     Status: Normal (Preliminary result)   Collection Time   01/03/11  8:40 AM      Component Value Range Status Comment   Specimen Description BLOOD HEMODIALYSIS CATHETER   Final    Special Requests     Final    Value: BOTTLES DRAWN AEROBIC AND ANAEROBIC PT ON VANC PRIMAXCIN   Setup Time [id number]   Final    Culture     Final    Value:        BLOOD CULTURE RECEIVED NO GROWTH TO DATE CULTURE WILL BE HELD FOR 5 DAYS BEFORE ISSUING A FINAL NEGATIVE REPORT   Report Status PENDING   Incomplete   CULTURE, BLOOD (ROUTINE X 2)     Status: Normal (Preliminary result)   Collection Time   01/03/11  9:05 AM      Component Value Range Status Comment   Specimen Description BLOOD HEMODIALYSIS CATHETER   Final    Special Requests BOTTLES DRAWN AEROBIC AND ANAEROBIC   Final  Setup Time [id number]   Final    Culture     Final    Value:        BLOOD CULTURE RECEIVED NO GROWTH TO DATE CULTURE WILL BE HELD FOR 5 DAYS BEFORE ISSUING A FINAL NEGATIVE REPORT   Report Status PENDING   Incomplete     Studies/Results: Ct Head Wo Contrast  11/24/2010  *RADIOLOGY REPORT*  Clinical Data: Syncope.  Post cardiac arrest.  End-stage renal disease.  CT HEAD WITHOUT CONTRAST  Technique:  Contiguous axial images were obtained from the base of the skull through the vertex without contrast.  Comparison: 10/09/2008.  Findings: No intracranial hemorrhage.  Prominent small vessel disease type changes.  Remote right thalamic tiny infarct.  No CT evidence of large acute infarct.  Small acute infarct cannot be excluded by CT.  No intracranial mass lesion detected on this unenhanced exam.  Global atrophy without hydrocephalus.  Vascular  calcifications.  Air-fluid level right sphenoid sinus.  IMPRESSION: No intracranial hemorrhage or CT evidence of large acute infarct.  Remote small right thalamic infarct.  Prominent small vessel disease type changes.  Global atrophy.  Air-fluid level right sphenoid sinus.  Original Report Authenticated By: Fuller Canada, M.D.   Mr Brain Wo Contrast  12/30/2010  *RADIOLOGY REPORT*  Clinical Data: Slurred speech.  Renal failure.  Syncope.  MRI HEAD WITHOUT CONTRAST  Technique:  Multiplanar, multiecho pulse sequences of the brain and surrounding structures were obtained according to standard protocol without intravenous contrast.  Comparison: CT 12/14/2010  Findings: Diffusion imaging does not show any acute or subacute infarction.  There is advanced generalized brain atrophy.  There are old small vessel infarctions within the cerebellum and there is extensive chronic small vessel disease throughout the cerebral hemispheres including the thalami, basal ganglia and hemispheric white matter.  No evidence of mass lesion, acute hemorrhage, hydrocephalus or extra-axial collection.  There are scattered punctate foci of hemosiderin deposition consistent with old small vessel infarctions.  No pituitary mass.  There are inflammatory changes of the sphenoid sinus.  IMPRESSION: No acute or reversible findings.  Advanced generalized brain atrophy and chronic small vessel disease throughout the brain.  Inflammatory changes of the sphenoid sinus.  Original Report Authenticated By: Thomasenia Sales, M.D.   Dg Hand 2 View Right  12/28/2010  *RADIOLOGY REPORT*  Clinical Data: Middle finger pain and swelling.  RIGHT HAND - 2 VIEW  Comparison: None.  Findings: Soft tissue swelling is seen involving the distal middle finger.  There is no evidence of fracture or bone destruction.  No evidence of soft tissue gas or radiopaque foreign body.  No evidence of arthritis or other significant bone abnormality.  IMPRESSION: Soft tissue  swelling of the distal little finger.  No osseous abnormality.  Original Report Authenticated By: Danae Orleans, M.D.   Dg Chest Port 1 View  01/01/2011  *RADIOLOGY REPORT*  Clinical Data: Coughing and rhonchi.  PORTABLE CHEST - 1 VIEW  Comparison: 12/30/2010  Findings: The cardiopericardial silhouette is enlarged.  The retrocardiac collapse / consolidation with left effusion persists. There is some improved aeration at the right base.  Right IJ dialysis catheter remains in place. Telemetry leads overlie the chest.  IMPRESSION: Persistent retrocardiac collapse / consolidation with effusion.  Improved aeration at the right base.  Original Report Authenticated By: ERIC A. MANSELL, M.D.   Dg Chest Port 1 View  12/30/2010  *RADIOLOGY REPORT*  Clinical Data: Shortness of breath.  PORTABLE CHEST - 1 VIEW  Comparison: Chest x-ray 12/26/2010.  Findings: The endotracheal  and NG tubes have been removed.  The right IJ catheter is stable.  The left subclavian central venous catheter is stable.  There are persistent bilateral infiltrates and a left pleural effusion.  Persistent central vascular congestion. Probable left pleural effusion.  IMPRESSION:  1. 1.  Removal of ET and NG tubes. 2.  Persistent central vascular congestion and patchy infiltrates. 3.  Probable left pleural effusion.  Original Report Authenticated By: P. Loralie Champagne, M.D.   Dg Chest Port 1 View  12/29/2010  *RADIOLOGY REPORT*  Clinical Data: Heart failure.  Intubated.  PORTABLE CHEST - 1 VIEW  Comparison: 12/25/2010  Findings: The endotracheal tube is in good position at the level of the aortic arch.  NG tube tip is below the diaphragm.  Left central venous catheter tip is in the superior vena cava.  Double-lumen right central catheter tips are at the cavoatrial junction.  Improved atelectasis at the right base.  Vascularity has improved and is now normal.    Persistent left effusion and atelectasis / consolidation at the left base although  aeration has slightly improved.  IMPRESSION:  Improved aeration at both bases.  However, there is persistent density particularly on the left consistent with effusion, atelectasis, and probable consolidation in the left lower lobe.  Original Report Authenticated By: Gwynn Burly, M.D.   Dg Chest Port 1 View  12/25/2010  *RADIOLOGY REPORT*  Clinical Data: Endotracheal tube placement, shortness of breath  PORTABLE CHEST - 1 VIEW  Comparison: Portable chest x-ray of 12/24/2010  Findings: There has been an increase in opacity at the lung bases. This may reflect atelectasis and effusions, but pneumonia cannot be excluded.  Cardiomegaly is stable.  Endotracheal tube, left central venous line, and right central venous line are unchanged in position.  IMPRESSION: Increase in basilar opacities left greater than right.  Possible atelectasis and effusions but cannot exclude pneumonia.  Original Report Authenticated By: Juline Patch, M.D.   Dg Chest Port 1 View  12/24/2010  *RADIOLOGY REPORT*  Clinical Data: Assess endotracheal tube.  PORTABLE CHEST - 1 VIEW  Comparison: 12/23/2010.  Findings: Endotracheal tube, nasogastric tube and left subclavian central line appear unchanged.  Right IJ dialysis catheter again noted.  Dense retrocardiac density is present compatible with atelectasis, airspace disease and left pleural effusion.  Slight worsening of aeration at the right lung base probably represents either edema or atelectasis.  This may represent a combination of both.  IMPRESSION:  1.  Stable support apparatus in good position. 2.  Worsening of aeration at the right lung base, likely increasing atelectasis.  Original Report Authenticated By: Andreas Newport, M.D.   Dg Chest Port 1 View  12/23/2010  *RADIOLOGY REPORT*  Clinical Data: Endotracheal tube placement  PORTABLE CHEST - 1 VIEW  Comparison: Portable chest x-ray of 12/22/2010  Findings: The tip of the endotracheal tube is approximately 3.8 cm above the  carina.  Aeration has improved with improvement in pulmonary vascular congestion as well.  There is still persistent opacity at the left lung base which may represent atelectasis, effusion, or possibly pneumonia.  Mild cardiomegaly is stable. Left and right central venous lines remain.  IMPRESSION:  1.  Improved aeration and improved pulmonary vascular congestion. 2.  Tip of endotracheal tube 3.8 cm above carina. 3.  Persistent left basilar opacity.  Original Report Authenticated By: Juline Patch, M.D.   Dg Chest Port 1 View  12/22/2010  *RADIOLOGY REPORT*  Clinical Data: Endotracheal tube placement  PORTABLE CHEST - 1 VIEW  Comparison: 12/21/2010  Findings: Cardiomediastinal silhouette is stable.  Slight worsening congestion/pulmonary edema.  Stable endotracheal tube position with tip 5.2 cm above the carina.  Right dialysis catheter is unchanged in position.  Stable left subclavian central line position. Probable bilateral small pleural effusion and bilateral basilar atelectasis or infiltrate.  IMPRESSION:  Slight worsening congestion/pulmonary edema.  Stable endotracheal tube position with tip 5.2 cm above the carina.  Right dialysis catheter is unchanged in position.  Stable left subclavian central line position.  Probable bilateral small pleural effusion and bilateral basilar atelectasis or infiltrate.  Original Report Authenticated By: Natasha Mead, M.D.   Dg Chest Port 1 View  12/21/2010  *RADIOLOGY REPORT*  Clinical Data: Endotracheal tube placement.  PORTABLE CHEST - 1 VIEW  Comparison: 12/20/2010  Findings: Endotracheal tube is 4.5 cm above the carina.  Improved aeration and probably decreased edema in the lower lungs.  Dialysis catheter tip near the cavoatrial junction.  Left subclavian central line in the upper SVC region.  Heart size is stable.  IMPRESSION: Improved aeration at the lung bases suggests decreased pulmonary edema.  Original Report Authenticated By: Richarda Overlie, M.D.   Dg Chest Port 1  View  12/20/2010  *RADIOLOGY REPORT*  Clinical Data: Perforation, ventilatory support, renal failure  PORTABLE CHEST - 1 VIEW  Comparison: 12/19/2010  Findings: Endotracheal tube 2.8 cm above the carina.  Bilateral central lines and NG tube stable position.  Monitor leads overlie the chest.  Heart remains enlarged with worsening diffuse pulmonary edema pattern noted.  Pleural effusions present bilaterally with basilar atelectasis / consolidation, worse in the left lower lobe. No pneumothorax.  IMPRESSION: Worsening CHF pattern.  Original Report Authenticated By: Judie Petit. Ruel Favors, M.D.   Dg Chest Port 1 View  12/19/2010  *RADIOLOGY REPORT*  Clinical Data: Assess endotracheal tube.  PORTABLE CHEST - 1 VIEW  Comparison: 12/18/2010.  Findings: Endotracheal tube tip 4 cm above the carina.  Right sided split catheter with tips in the region of the distal superior vena cava.  Left central line tip mid superior vena cava level. Nasogastric tube courses below the diaphragm.  The tip is not included on this exam.  No gross pneumothorax.  Cardiomegaly.  Central pulmonary vascular prominence/mild pulmonary edema.  Calcified tortuous aorta.  Bibasilar patchy opacity may represent atelectasis and crowding of vessels although infectious infiltrate not excluded.  IMPRESSION: No significant change.  Original Report Authenticated By: Fuller Canada, M.D.   Dg Chest Port 1 View  12/18/2010  *RADIOLOGY REPORT*  Clinical Data: Evaluate endotracheal tube position  PORTABLE CHEST - 1 VIEW  Comparison: Portable chest x-ray of 12/17/2010  Findings: The tip of the endotracheal tube is approximately 8-0.6 cm above the carina.  There is evidence of pulmonary vascular congestion present with probable small effusions.  Cardiomegaly is stable.  Left and right central venous lines remain.  IMPRESSION:  1.  Endotracheal tip approximately 2.6 cm above the carina. 2.  Little change in probable mild pulmonary vascular congestion and small  effusions.  Original Report Authenticated By: Juline Patch, M.D.   Dg Chest Port 1 View  12/17/2010  *RADIOLOGY REPORT*  Clinical Data: ET tube placement, shortness of breath.  PORTABLE CHEST - 1 VIEW  Comparison: None.  Findings: Diffuse interstitial prominence throughout the lungs, likely mild edema.  Heart is borderline in size.  There is bibasilar atelectasis or infiltrates, increased since prior study.  IMPRESSION: Increasing bibasilar  atelectasis or infiltrates.  Increasing interstitial prominence, likely interstitial edema.  Original Report Authenticated By: Cyndie Chime, M.D.   Dg Chest Port 1 View  12/16/2010  *RADIOLOGY REPORT*  Clinical Data: Query aspiration.  PORTABLE CHEST - 1 VIEW  Comparison: 12/16/2010 at 5:33 a.m.  Findings: The endotracheal tube tip is approximately 2 cm above the carina.  Nasogastric tube is in place.  A right internal jugular central venous catheter is noted with tip at the cavoatrial junction.  Left central line tip projects over the SVC.  Airspace opacity noted in the left lower lobe.  There is hazy opacity along the right hemithorax but with improved aeration of the right lower lobe.  IMPRESSION: 1.  Improved aeration of the right lower lobe, with hazy opacity over the right hemithorax which could reflect a layering pleural effusion.  2.  Continued airspace opacity in the left lower lobe, possibly pneumonia or atelectasis. Aspiration pneumonitis cannot be excluded, although the endotracheal tube may have a protective effect against new aspiration.  3.  The endotracheal tube has been retracted and its tip is 2 cm above the carina.  Original Report Authenticated By: Dellia Cloud, M.D.   Dg Chest Port 1 View  12/16/2010  *RADIOLOGY REPORT*  Clinical Data: Respiratory failure.  PORTABLE CHEST - 1 VIEW  Comparison: 12/15/2010.  Findings: Endotracheal tube tip is at the level of the carina. Recommend retracting endotracheal tube tip by 3 cm.  Right central  line tip distal superior vena cava level.  Left central line tip mid superior vena cava level.  Cardiomegaly.  Asymmetric air space disease most notable in the lung bases.  This may represent pulmonary edema although infiltrate not excluded in the proper clinical setting.  No gross pneumothorax.  Nasogastric tube courses below the diaphragm.  The tip is not included on this exam.  IMPRESSION: Endotracheal tube tip is at the level of the carina.   Recommend retracting endotracheal tube tip by 3 cm.  Asymmetric air space disease most notable lung bases without significant change.  Question pulmonary edema and / or infectious infiltrate?  Original Report Authenticated By: Fuller Canada, M.D.   Dg Chest Port 1 View  12/15/2010  *RADIOLOGY REPORT*  Clinical Data: Respiratory failure.  PORTABLE CHEST - 1 VIEW  Comparison: Chest x-ray 12/30/10.  Findings: The endotracheal tube is 13 mm above the carina and should be retracted 2 cm. The remaining support apparatus is stable.  The cardiac silhouette, mediastinal and hilar contours are stable.  The lungs demonstrate slight improved aeration with resolving edema and atelectasis.  IMPRESSION:  1.  The endotracheal tube is 13 mm above the carina and should be retracted 2 cm. 2.  The remaining support apparatus is stable. 3.  Slight improved lung aeration with resolving edema and atelectasis or infiltrates.  Original Report Authenticated By: P. Loralie Champagne, M.D.   Dg Chest Port 1 View  Dec 30, 2010  *RADIOLOGY REPORT*  Clinical Data: Central line placement  PORTABLE CHEST - 1 VIEW  Comparison: Portable exam 1650 hours compared to 1325 hours  Findings: Left subclavian venous catheter, tip projects over SVC. Endotracheal tube, tip 3.5 cm above carina. Right jugular dual-lumen central venous catheter, tip projecting over SVC. Nasogastric tube extends into stomach. Normal heart size. Tortuous aorta with atherosclerotic calcification. Bilateral airspace infiltrates,  slightly increased on the left since previous exam. No pneumothorax.  IMPRESSION: No pneumothorax following central line placement. Bilateral pulmonary infiltrates greater on the right, increased left lower lobe  since previous exam.  Original Report Authenticated By: Lollie Marrow, M.D.   Dg Chest Portable 1 View  12/12/2010  *RADIOLOGY REPORT*  Clinical Data: Intubated, unresponsive  PORTABLE CHEST - 1 VIEW  Comparison: 11/13/2010  Findings: Endotracheal tube tip 3.5 cm above carina.  Nasogastric tube been placed at least as far as the stomach, tip not seen. Right IJ central line stable in position.  There are new alveolar opacities centrally in both lung bases and in the right upper lobe. No definite effusion although the lateral costophrenic angles are excluded.  Heart size upper limits normal for technique.  IMPRESSION: 1.  Endotracheal tube and nasogastric tube placement as above. 2.  New bibasilar right upper lobe alveolar opacities, possibly asymmetric edema versus infiltrates.  Original Report Authenticated By: Osa Craver, M.D.   Dg Swallowing Func-no Report  01/02/2011  CLINICAL DATA: dysphagia   FLUOROSCOPY FOR SWALLOWING FUNCTION STUDY:  Fluoroscopy was provided for swallowing function study, which was  administered by a speech pathologist.  Final results and recommendations  from this study are contained within the speech pathology report.      Medications: Scheduled Meds:    . antiseptic oral rinse  15 mL Mouth Rinse BID  . levalbuterol  0.63 mg Nebulization QID   Continuous Infusions:    . dextrose 20 mL/hr (01/06/11 0500)     Assessment/Plan:  Atrial fibrillation with RVR and acute respiratory failure hypoxic:  - Continue symptom management    Recurrent aspiration pneumonia with shock:  S/p rx with Vanc and Zosyn.  He still has lots of ronchi and sounds congested.  I suspect her chronically aspirates.   - Per speech therapy started on pured diet/comfort  feeds   VDRF in setting of cardiac arrest: extubated 12/7 with no plan for reintubation, now comfort care after palliative meeting today    Severe hypoglycemia: Dysphagia Diet   Diarrhea: Resolved   R distal middle finger traumatic injury: continue the recommendations from the hand surgeon for care   Thrombocytopenia  Resolved   HTN -controlled at present   Acute on chronic systolic CHF Volume being handled via dialysis   ESRD: HD per renal on his usual T,Th,Sat schedule, family leaning towards discontinuing hemodialysis   Anoxic encephalopathy:  His mental status appears to be waxing and waning-this may be his new baseline mental status-we will continue to follow   Anemia  Hemoglobin is holding steady   CODE STATUS: DNR., DNI. Comfort care,  patient's daughter now retracting decision on total comfort care.   LOS: 24 days   Earlene Plater MD, [place] Pier 01/06/2011, 2:41 PM

## 2011-01-06 NOTE — Progress Notes (Signed)
Physical Therapy Note: Attempted to see pt fatigued after HD per dgtr. Dgtr would like P.T. To attempt again on a nonHD day before terminating therapy. If pt able to participate she would like [place] to continue but if pt unable or painful she is agreeable to D/C of our services. Thanks Toney Sang, PT [phone]

## 2011-01-06 NOTE — Progress Notes (Signed)
Speech Language/Pathology   Attempted f/u however patient is in HD at this time. Will continue to f/u.  Ferdinand Lango MA, CCC-SLP [phone]

## 2011-01-06 NOTE — Progress Notes (Signed)
Physical Therapy Note: Late entry for 0845. Pt currently in HD and unable to participate in therapy. Will retry as time allows. Also request clarification now that pt mainly comfort care is therapy to attempt to proceed? Thanks Toney Sang, PT [phone]

## 2011-01-07 ENCOUNTER — Inpatient Hospital Stay (HOSPITAL_COMMUNITY): Payer: Medicare Other

## 2011-01-07 LAB — GLUCOSE, CAPILLARY
Glucose-Capillary: 105 mg/dL — ABNORMAL HIGH (ref 70–99)
Glucose-Capillary: 59 mg/dL — ABNORMAL LOW (ref 70–99)
Glucose-Capillary: 115 mg/dL — ABNORMAL HIGH (ref 70–99)
Glucose-Capillary: 98 mg/dL (ref 70–99)

## 2011-01-07 MED ORDER — HEPARIN SODIUM (PORCINE) 1000 UNIT/ML DIALYSIS
40.0000 [IU]/kg | INTRAMUSCULAR | Status: DC | PRN
  Administered 2011-01-08: 2600 [IU] via INTRAVENOUS_CENTRAL
  Filled 2011-01-07: qty 3

## 2011-01-07 MED ORDER — SODIUM CHLORIDE 0.9 % IV SOLN
500.0000 mg | Freq: Once | INTRAVENOUS | Status: AC
  Administered 2011-01-07: 500 mg via INTRAVENOUS
  Filled 2011-01-07: qty 500

## 2011-01-07 MED ORDER — DEXTROSE 50 % IV SOLN
INTRAVENOUS | Status: AC
  Administered 2011-01-07: 50 mL
  Filled 2011-01-07: qty 50

## 2011-01-07 MED ORDER — SODIUM CHLORIDE 4 MEQ/ML IV SOLN
INTRAVENOUS | Status: DC
  Administered 2011-01-07 – 2011-01-10 (×3): via INTRAVENOUS
  Filled 2011-01-07 (×5): qty 1000

## 2011-01-07 MED ORDER — VANCOMYCIN HCL IN DEXTROSE 1-5 GM/200ML-% IV SOLN
1000.0000 mg | Freq: Once | INTRAVENOUS | Status: AC
  Administered 2011-01-07: 1000 mg via INTRAVENOUS
  Filled 2011-01-07: qty 200

## 2011-01-07 MED ORDER — DARBEPOETIN ALFA-POLYSORBATE 40 MCG/0.4ML IJ SOLN
40.0000 ug | Freq: Once | INTRAMUSCULAR | Status: AC
  Administered 2011-01-08: 40 ug via INTRAVENOUS
  Filled 2011-01-07: qty 0.4

## 2011-01-07 MED ORDER — DEXTROSE 5 % IV SOLN: 2.0000 g | Freq: Once | INTRAVENOUS | Status: DC

## 2011-01-07 NOTE — Progress Notes (Signed)
Subjective: Febrile today.  Objective: Vital signs in last 24 hours: Filed Vitals:   01/06/11 1140 01/06/11 1147 01/06/11 2120 01/07/11 0500  BP: 89/48 148/68  105/66  Pulse: 91 91  106  Temp: 97 F (36.1 C) 97 F (36.1 C)  100.8 F (38.2 C)  TempSrc: Oral Oral  Axillary  Resp: 18 19  16   Height:      Weight:  64.1 kg (141 lb 5 oz)    SpO2:  94% 99% 92%    Intake/Output Summary (Last 24 hours) at 01/07/11 1101 Last data filed at 01/06/11 1147  Gross per 24 hour  Intake      0 ml  Output   1705 ml  Net  -1705 ml    Weight change:   General: Alert, awake, have a chronic cough HEENT: No bruits, no goiter. Heart: Regular rate and rhythm, without murmurs, rubs, gallops. Lungs: Clear to auscultation bilaterally. Abdomen: Soft, nontender, nondistended, positive bowel sounds. Extremities: No clubbing cyanosis or edema with positive pedal pulses. Neuro: Grossly intact, nonfocal.    Lab Results: Results for orders placed during the hospital encounter of 12/19/2010 (from the past 24 hour(s))  GLUCOSE, CAPILLARY     Status: Abnormal   Collection Time   01/06/11 12:41 PM      Component Value Range   Glucose-Capillary 56 (*) 70 - 99 (mg/dL)   Comment 1 Notify RN    GLUCOSE, CAPILLARY     Status: Abnormal   Collection Time   01/06/11  3:55 PM      Component Value Range   Glucose-Capillary 67 (*) 70 - 99 (mg/dL)   Comment 1 Documented in Chart     Comment 2 Notify RN    GLUCOSE, CAPILLARY     Status: Normal   Collection Time   01/06/11  6:44 PM      Component Value Range   Glucose-Capillary 97  70 - 99 (mg/dL)  GLUCOSE, CAPILLARY     Status: Abnormal   Collection Time   01/06/11  8:42 PM      Component Value Range   Glucose-Capillary 105 (*) 70 - 99 (mg/dL)  GLUCOSE, CAPILLARY     Status: Normal   Collection Time   01/07/11 12:15 AM      Component Value Range   Glucose-Capillary 81  70 - 99 (mg/dL)   Comment 1 Notify RN    GLUCOSE, CAPILLARY     Status: Normal   Collection Time   01/07/11  4:34 AM      Component Value Range   Glucose-Capillary 84  70 - 99 (mg/dL)   Comment 1 Notify RN    GLUCOSE, CAPILLARY     Status: Abnormal   Collection Time   01/07/11  8:05 AM      Component Value Range   Glucose-Capillary 59 (*) 70 - 99 (mg/dL)   Comment 1 Documented in Chart     Comment 2 Notify RN    GLUCOSE, CAPILLARY     Status: Abnormal   Collection Time   01/07/11  9:19 AM      Component Value Range   Glucose-Capillary 115 (*) 70 - 99 (mg/dL)   Comment 1 Notify RN     Comment 2 Documented in Chart       Micro: Recent Results (from the past 240 hour(s))  CULTURE, BLOOD (ROUTINE X 2)     Status: Normal (Preliminary result)   Collection Time   01/03/11  8:40 AM  Component Value Range Status Comment   Specimen Description BLOOD HEMODIALYSIS CATHETER   Final    Special Requests     Final    Value: BOTTLES DRAWN AEROBIC AND ANAEROBIC PT ON VANC PRIMAXCIN   Setup Time [id number]   Final    Culture     Final    Value:        BLOOD CULTURE RECEIVED NO GROWTH TO DATE CULTURE WILL BE HELD FOR 5 DAYS BEFORE ISSUING A FINAL NEGATIVE REPORT   Report Status PENDING   Incomplete   CULTURE, BLOOD (ROUTINE X 2)     Status: Normal (Preliminary result)   Collection Time   01/03/11  9:05 AM      Component Value Range Status Comment   Specimen Description BLOOD HEMODIALYSIS CATHETER   Final    Special Requests BOTTLES DRAWN AEROBIC AND ANAEROBIC   Final    Setup Time [id number]   Final    Culture     Final    Value:        BLOOD CULTURE RECEIVED NO GROWTH TO DATE CULTURE WILL BE HELD FOR 5 DAYS BEFORE ISSUING A FINAL NEGATIVE REPORT   Report Status PENDING   Incomplete     Studies/Results: No results found.  Medications:.     . antiseptic oral rinse  15 mL Mouth Rinse BID  . dextrose      . dextrose      . levalbuterol  0.63 mg Nebulization TID  . DISCONTD: levalbuterol  0.63 mg Nebulization QID     Assessment: Atrial  fibrillation with RVR and acute respiratory failure hypoxic:  - Continue symptom management  Recurrent aspiration pneumonia with shock:  S/p rx with Vanc and Zosyn. He still has lots of ronchi and sounds congested. I suspect her chronically aspirates, which is the cause of his fever. Will repeat chest x-ray. Hold off on antibiotics. As he just recently completed a course. Repeat blood cultures - Per speech therapy started on pured diet/comfort feeds  VDRF in setting of cardiac arrest: extubated 12/7 with no plan for reintubation, now comfort care after palliative meeting today  Severe hypoglycemia: Dysphagia Diet glipizide DC'd Diarrhea: Resolved  R distal middle finger traumatic injury: continue the recommendations from the hand surgeon for care  Thrombocytopenia  Resolved  HTN -controlled at present  Acute on chronic systolic CHF Volume being handled via dialysis  ESRD: HD per renal on his usual T,Th,Sat schedule, family leaning towards discontinuing hemodialysis, however this was restarted. Nephrology will address the utility of continued hemodialysis. Anoxic encephalopathy:  His mental status appears to be waxing and waning-this may be his new baseline mental status-we will continue to follow  Anemia  Hemoglobin is holding steady  CODE STATUS: DNR., DNI. Goals of care still on clarified as hemodialysis was restarted. Also suspect chronic aspiration. Oral prognosis is extremely poor. LOS: 24 days     LOS: 25 days   Charm Stenner 01/07/2011, 11:01 AM

## 2011-01-07 NOTE — Progress Notes (Signed)
Late Entry for 12/18 Patient in dialysis.  Please note antibiotics stopped when family elected comfort care.  They have since retracted full comforted and restarted dialysis.  Patient chronically aspirating.  If antibiotics are indicated please consider resuming them and communicating with family if patient starts to decline.  Karol Liendo L. [name], MD MBA The Palliative Medicine Team at [facility] Phone: [phone] Pager: [phone]

## 2011-01-07 NOTE — Progress Notes (Signed)
Speech Language Pathology SLP Cancellation Note  Treatment cancelled today due to patient in a very lethargic state, unable to accept or consume any PO textures per RN and sitter.  Staff report patient ate well last evening, however vomited thereafter and has not been alert enough to consume any PO today.    Plan: Will f/u on 12/20 with family regarding education for PO with likely D/C from SLP services thereafter.  Myra Rude, M.S.,CCC-SLP Pager [phone]

## 2011-01-07 NOTE — Progress Notes (Signed)
12/25

## 2011-01-07 NOTE — Progress Notes (Signed)
PT Cancellation Note  Unable to arouse pt via loud verbal calling, sternal rub, shaking arm, or painful stimulus to toes. RN made aware immediately, will continue at later time as appropriate. Thank you,  Dahlia Client (Beverely Pace) Carleene Mains PT, DPT Acute Rehabilitation [phone]

## 2011-01-07 NOTE — Progress Notes (Signed)
Patient ID: Jackson [name], male   DOB: [birth year], 77 y.o.   MRN: [id number]  S:Sleepingly soundly and difficult to arouse with speech or shaking shoulder.   O: BP 105/66  Pulse 106  Temp(Src) 100.8 F (38.2 C) (Axillary)  Resp 16  Ht 5\' 11"  (1.803 m)  Wt 64.1 kg (141 lb 5 oz)  BMI 19.71 kg/m2  SpO2 92%  WUJ:WJXBJYN comfortably in bed WGN:FAOZH regular tachycardia, s1 and s2 with ESM Resp:Coarse BS, no rales/rhonchi YQM:VHQI, flat, NT Ext:No LE edema      . antiseptic oral rinse  15 mL Mouth Rinse BID  . dextrose      . dextrose      . levalbuterol  0.63 mg Nebulization TID  . DISCONTD: levalbuterol  0.63 mg Nebulization QID    BMET  Lab 01/06/11 0823 01/03/11 0709  NA 133* 134*  K 3.6 3.8  CL 97 99  CO2 27 28  GLUCOSE 77 95  BUN 26* 27*  CREATININE 7.15* 6.73*  ALB -- --  CALCIUM 8.3* 8.3*  PHOS 3.8 3.9   CBC  Lab 01/06/11 0823 01/03/11 0709 01/01/11 0755  WBC 5.4 8.7 4.8  NEUTROABS -- -- --  HGB 8.4* 9.1* 9.1*  HCT 25.0* 27.5* 27.5*  MCV 85.0 86.8 86.8  PLT 189 159 177    @IMGRELPRIORS @  Assessment/Plan: 1.S/P cardiac arrest, VDRF now with dysphagia/deconditioning and what appears to be aspiration+/- pneumonitis. Difficult situation with poor prognosis noted but family optimistic and retracted comfort care approach.   2.ESRD: Plan for HD tomorrow later in the day, will address futility in this exercise with family. 3. Anemia: Hgb Low, no overt losses- will restart ESA  4. CKD-MBD: Poor intake, off binders/VDRA. 5. Nutrition: Poor intake due to dysphagia/AMS. 6. Fever/Tachycardia: Suspected pneumonitis from aspiration. Off antibiotics at this time- may need to restart these in the near future as treatment goal re-established. Skylarr Liz K.

## 2011-01-07 NOTE — Progress Notes (Addendum)
Per MD, patient not ready to dc. CSW went to room but patient sleeping and no family present. CSW will continue to follow to offer support and assist with dc planning. [place], [place] [phone]  Dtr arrived in room shortly after. Dtr reported that she and family chose [facility]. CSW explained dc process for when patient was medically ready to dc. Dtr reported she will continue to stay in touch with SNF and will contact CSW with any questions. CSW called SNF who stated patient can admit whenever medically ready. CSW will continue to follow. [place], [place] [phone]

## 2011-01-08 ENCOUNTER — Inpatient Hospital Stay (HOSPITAL_COMMUNITY): Payer: Medicare Other

## 2011-01-08 LAB — RENAL FUNCTION PANEL
Albumin: 1.6 g/dL — ABNORMAL LOW (ref 3.5–5.2)
BUN: 32 mg/dL — ABNORMAL HIGH (ref 6–23)
Chloride: 100 mEq/L (ref 96–112)
Glucose, Bld: 97 mg/dL (ref 70–99)
Potassium: 4.4 mEq/L (ref 3.5–5.1)

## 2011-01-08 LAB — CBC
HCT: 21.2 % — ABNORMAL LOW (ref 39.0–52.0)
Hemoglobin: 7.1 g/dL — ABNORMAL LOW (ref 13.0–17.0)
RBC: 2.48 MIL/uL — ABNORMAL LOW (ref 4.22–5.81)
RDW: 17.4 % — ABNORMAL HIGH (ref 11.5–15.5)
WBC: 9.4 10*3/uL (ref 4.0–10.5)

## 2011-01-08 LAB — GLUCOSE, CAPILLARY
Glucose-Capillary: 92 mg/dL (ref 70–99)
Glucose-Capillary: 95 mg/dL (ref 70–99)
Glucose-Capillary: 52 mg/dL — ABNORMAL LOW (ref 70–99)
Glucose-Capillary: 168 mg/dL — ABNORMAL HIGH (ref 70–99)
Glucose-Capillary: 81 mg/dL (ref 70–99)

## 2011-01-08 MED ORDER — DARBEPOETIN ALFA-POLYSORBATE 40 MCG/0.4ML IJ SOLN
INTRAMUSCULAR | Status: AC
  Administered 2011-01-08: 40 ug via INTRAVENOUS
  Filled 2011-01-08: qty 0.4

## 2011-01-08 MED ORDER — SODIUM CHLORIDE 0.9 % IV SOLN
250.0000 mg | Freq: Two times a day (BID) | INTRAVENOUS | Status: DC
  Administered 2011-01-08 – 2011-01-10 (×5): 250 mg via INTRAVENOUS
  Filled 2011-01-08 (×6): qty 250

## 2011-01-08 MED ORDER — DARBEPOETIN ALFA-POLYSORBATE 40 MCG/0.4ML IJ SOLN
40.0000 ug | INTRAMUSCULAR | Status: DC
  Filled 2011-01-08: qty 0.4

## 2011-01-08 MED ORDER — DEXTROSE 50 % IV SOLN
INTRAVENOUS | Status: AC
  Administered 2011-01-08: 50 mL
  Filled 2011-01-08: qty 50

## 2011-01-08 MED ORDER — VANCOMYCIN HCL IN DEXTROSE 1-5 GM/200ML-% IV SOLN
1000.0000 mg | Freq: Once | INTRAVENOUS | Status: AC
  Administered 2011-01-08: 1000 mg via INTRAVENOUS
  Filled 2011-01-08: qty 200

## 2011-01-08 MED ORDER — VANCOMYCIN HCL 500 MG IV SOLR
500.0000 mg | INTRAVENOUS | Status: DC
  Administered 2011-01-10: 500 mg via INTRAVENOUS
  Filled 2011-01-08: qty 500

## 2011-01-08 NOTE — Progress Notes (Signed)
Patient ID: Jackson [name], male   DOB: May 28, 1928, 77 y.o.   MRN: [id number]  S:Today the patient is more awake and able to respond to questions and commands. He wants to continue doing dialysis and got angry at me for suggesting otherwise. Tmax 101.5   O: BP 104/61  Pulse 112  Temp(Src) 98.6 F (37 C) (Axillary)  Resp 18  Ht 5\' 11"  (1.803 m)  Wt 64.1 kg (141 lb 5 oz)  BMI 19.71 kg/m2  SpO2 100%  QMV:HQIONGEXBMW sleeping in bed, arouses to speech  UXL:KGMWN RRR, normal S1 and S2  Resp:Coarse BS bilaterally, no rales UUV:OZDG, flat, NT, BS normal Ext:No LE edema      . antiseptic oral rinse  15 mL Mouth Rinse BID  . darbepoetin (ARANESP) injection - DIALYSIS  40 mcg Intravenous Once  . darbepoetin (ARANESP) injection - DIALYSIS  40 mcg Intravenous Q Thu-HD  . imipenem-cilastatin  500 mg Intravenous Once  . levalbuterol  0.63 mg Nebulization TID  . vancomycin  1,000 mg Intravenous Once  . DISCONTD: cefTAZidime (FORTAZ)  IV  2 g Intravenous Once    BMET  Lab 01/06/11 0823 01/03/11 0709  NA 133* 134*  K 3.6 3.8  CL 97 99  CO2 27 28  GLUCOSE 77 95  BUN 26* 27*  CREATININE 7.15* 6.73*  ALB -- --  CALCIUM 8.3* 8.3*  PHOS 3.8 3.9   CBC  Lab 01/06/11 0823 01/03/11 0709  WBC 5.4 8.7  NEUTROABS -- --  HGB 8.4* 9.1*  HCT 25.0* 27.5*  MCV 85.0 86.8  PLT 189 159      Assessment/Plan:   1.S/P cardiac arrest, VDRF now with dysphagia/deconditioning and what appears to be aspiration+/- pneumonitis. Difficult situation with poor prognosis noted but family optimistic and retracted comfort care approach.  2.ESRD: Plan for HD today later in the day per the patient's wishes. Appears that this will likely be on of the palliative care measure that he will continue upon discharge to [facility] when ready. 3. Anemia: Hgb Low, no overt losses- will restart ESA  4. CKD-MBD: Poor intake, off binders/VDRA.  5. Nutrition: Poor intake due to dysphagia/AMS.  6.  Fever/Tachycardia: Suspected pneumonitis from aspiration. Would start antibiotics via dialysis and send blood cultures today.  Overall prognosis poor, however, patient wants to press on with current level of care.   Meganne Rita K.

## 2011-01-08 NOTE — Progress Notes (Signed)
Speech Language Pathology Treatment Note  Subjective:  Awake, alert, conversive  Objective: Observed 2 bites of puree (applesauce) without any overt clinical indicators of a aspiration and functional oral phase abilities.  Patient refused any liquid trials.  Met with RN and nurse tech who reported minimal liquid intake, however consumption of 4 oz applesauce.  Assessment:  Demonstrates tolerance of current diet and these modified consistencies appear to be most appropriate for patient's current condition and nutritional intake.  No family present and left a voicemail message with patient's daughter regarding tolerance and discharge from SLP services.  Recommendations:  1.  Continue Puree/Thin liquids when alert 2.  No further skilled SLP services  Pain:   none Intervention Required:   No  Goals: All Goals Met  Myra Rude, M.S.,CCC-SLP Pager [phone]

## 2011-01-08 NOTE — Progress Notes (Signed)
ANTIBIOTIC CONSULT NOTE - FOLLOW UP  Pharmacy Consult for Vancomycin/Imipenem Indication: Recurrent Aspiration Pneumonia  Assessment: Pt is well known to Rx for prior abx dosing. He has been on vanc and primaxin for approximately 11 days. Abx was dced on 12/19. He continues to have fevers. He was given vanc 1g IV yesterday. Pt and family wishes to cont therapy despite poor prognosis. All cultures have been neg to date. There is a plan HD session this PM.  Pharmacy System-Based Medication Review: Anticoagulation: None Infectious Disease: Persistent fevers in a setting of recurrent aspiration PNA. Restarting vanc/primaxin.  Pulmonary: COPD - 91% 4L Cardiovascular: CHF, HTN, lipidemia - 106/67 HR 110 Prn hydralazine Gastrointestinal: GERD - no meds Endocrinology: Gout - prn ultram Fluids/Electrolytes/Nutrition: Poor nutrition status Nephrology: ESRD - HD TTS Hematology/Oncology: H/o prostate CA. Chronic anemia - on aranesp  Goal of Therapy:  Vancomycin trough level 15-25 mcg/ml  Plan:  1. Vanc 1g IV x1 after HD today then 500mg  IV qHD 2. Imipenem 250mg  IV q12h 3. Follow-up antibiotic plans.   Allergies  Allergen Reactions  . Fortaz (Ceftazidime) Itching    Patient Measurements: Height: 5\' 11"  (180.3 cm) Weight: 139 lb 12.4 oz (63.4 kg) IBW/kg (Calculated) : 75.3   Vital Signs: Temp: 100.1 F (37.8 C) (12/20 1230) Temp src: Oral (12/20 1230) BP: 106/67 mmHg (12/20 1230) Pulse Rate: 110  (12/20 1230) Intake/Output from previous day:   Intake/Output from this shift:    Labs:  [facility] 01/06/11 0823  WBC 5.4  HGB 8.4*  PLT 189  LABCREA --  CREATININE 7.15*   Estimated Creatinine Clearance: 7.4 ml/min (by C-G formula based on Cr of 7.15). No results found for this basename: VANCOTROUGH:2,VANCOPEAK:2,VANCORANDOM:2,GENTTROUGH:2,GENTPEAK:2,GENTRANDOM:2,TOBRATROUGH:2,TOBRAPEAK:2,TOBRARND:2,AMIKACINPEAK:2,AMIKACINTROU:2,AMIKACIN:2, in the last 72 hours

## 2011-01-08 NOTE — Progress Notes (Addendum)
Subjective: patient is more awake and able to respond to questions and commands does  want to continue with dialysis , continues to spike fever    Objective: Vital signs in last 24 hours: Filed Vitals:   01/08/11 0702 01/08/11 0711 01/08/11 0855 01/08/11 1230  BP: 104/61   106/67  Pulse: 112   110  Temp: 101.5 F (38.6 C) 98.6 F (37 C)  100.1 F (37.8 C)  TempSrc: Axillary Axillary  Oral  Resp: 18     Height:      Weight:    63.4 kg (139 lb 12.4 oz)  SpO2: 84%  100% 91%   No intake or output data in the 24 hours ending 01/08/11 1255  Weight change:   ZOX:WRUEAVWUJWJ sleeping in bed, arouses to speech  XBJ:YNWGN RRR, normal S1 and S2  Resp:Coarse BS bilaterally, no rales  FAO:ZHYQ, flat, NT, BS normal  Ext:No LE edema   Lab Results: Results for orders placed during the hospital encounter of 01/03/2011 (from the past 24 hour(s))  CULTURE, BLOOD (ROUTINE X 2)     Status: Normal (Preliminary result)   Collection Time   01/07/11  1:00 PM      Component Value Range   Specimen Description BLOOD RIGHT HAND     Special Requests BOTTLES DRAWN AEROBIC ONLY 5CC     Setup Time [id number]     Culture       Value:        BLOOD CULTURE RECEIVED NO GROWTH TO DATE CULTURE WILL BE HELD FOR 5 DAYS BEFORE ISSUING A FINAL NEGATIVE REPORT   Report Status PENDING    GLUCOSE, CAPILLARY     Status: Normal   Collection Time   01/07/11  3:53 PM      Component Value Range   Glucose-Capillary 83  70 - 99 (mg/dL)   Comment 1 Notify RN    GLUCOSE, CAPILLARY     Status: Normal   Collection Time   01/07/11  7:25 PM      Component Value Range   Glucose-Capillary 98  70 - 99 (mg/dL)  GLUCOSE, CAPILLARY     Status: Normal   Collection Time   01/07/11 11:31 PM      Component Value Range   Glucose-Capillary 95  70 - 99 (mg/dL)   Comment 1 Notify RN    GLUCOSE, CAPILLARY     Status: Normal   Collection Time   01/08/11  4:03 AM      Component Value Range   Glucose-Capillary 92  70 - 99 (mg/dL)    Comment 1 Notify RN    GLUCOSE, CAPILLARY     Status: Normal   Collection Time   01/08/11  8:00 AM      Component Value Range   Glucose-Capillary 88  70 - 99 (mg/dL)   Comment 1 Documented in Chart     Comment 2 Notify RN    GLUCOSE, CAPILLARY     Status: Normal   Collection Time   01/08/11 12:04 PM      Component Value Range   Glucose-Capillary 85  70 - 99 (mg/dL)   Comment 1 Documented in Chart     Comment 2 Notify RN       Micro: Recent Results (from the past 240 hour(s))  CULTURE, BLOOD (ROUTINE X 2)     Status: Normal (Preliminary result)   Collection Time   01/03/11  8:40 AM      Component Value Range Status Comment  Specimen Description BLOOD HEMODIALYSIS CATHETER   Final    Special Requests     Final    Value: BOTTLES DRAWN AEROBIC AND ANAEROBIC PT ON VANC PRIMAXCIN   Setup Time [id number]   Final    Culture     Final    Value:        BLOOD CULTURE RECEIVED NO GROWTH TO DATE CULTURE WILL BE HELD FOR 5 DAYS BEFORE ISSUING A FINAL NEGATIVE REPORT   Report Status PENDING   Incomplete   CULTURE, BLOOD (ROUTINE X 2)     Status: Normal (Preliminary result)   Collection Time   01/03/11  9:05 AM      Component Value Range Status Comment   Specimen Description BLOOD HEMODIALYSIS CATHETER   Final    Special Requests BOTTLES DRAWN AEROBIC AND ANAEROBIC   Final    Setup Time [id number]   Final    Culture     Final    Value:        BLOOD CULTURE RECEIVED NO GROWTH TO DATE CULTURE WILL BE HELD FOR 5 DAYS BEFORE ISSUING A FINAL NEGATIVE REPORT   Report Status PENDING   Incomplete   CULTURE, BLOOD (ROUTINE X 2)     Status: Normal (Preliminary result)   Collection Time   01/07/11 12:51 PM      Component Value Range Status Comment   Specimen Description BLOOD LEFT HAND   Final    Special Requests BOTTLES DRAWN AEROBIC AND ANAEROBIC 5CC   Final    Setup Time [id number]   Final    Culture     Final    Value:        BLOOD CULTURE RECEIVED NO GROWTH TO DATE  CULTURE WILL BE HELD FOR 5 DAYS BEFORE ISSUING A FINAL NEGATIVE REPORT   Report Status PENDING   Incomplete   CULTURE, BLOOD (ROUTINE X 2)     Status: Normal (Preliminary result)   Collection Time   01/07/11  1:00 PM      Component Value Range Status Comment   Specimen Description BLOOD RIGHT HAND   Final    Special Requests BOTTLES DRAWN AEROBIC ONLY 5CC   Final    Setup Time [id number]   Final    Culture     Final    Value:        BLOOD CULTURE RECEIVED NO GROWTH TO DATE CULTURE WILL BE HELD FOR 5 DAYS BEFORE ISSUING A FINAL NEGATIVE REPORT   Report Status PENDING   Incomplete     Studies/Results: Dg Chest 1 View  01/07/2011  *RADIOLOGY REPORT*  Clinical Data: Altered mental status, shortness of breath  CHEST - 1 VIEW  Comparison: Portable chest x-ray of 01/01/2011, 12/20/2010, and 11/13/2010  Findings: The left pleural effusion appears to have been evacuated. The lungs are better aerated.  There is a new nodular opacity present at the left lung base medially which is not seen on the chest x-ray from October 2012.  This may be due to pneumonia or possibly loculated fluid, and follow-up two-view chest x-ray is recommended.  There is cardiomegaly present with mild pulmonary vascular congestion.  Right central venous line tips are at the expected SVC/RA junction.  IMPRESSION:  1.  Improved aeration with evacuation of much of the left pleural effusion. 2.  Nodular opacity medially at the left lung base not present in October.  Possible pneumonia or loculated fluid.  Recommend two- view chest x-ray. 3.  Cardiomegaly and  mild pulmonary vascular congestion.  Original Report Authenticated By: Juline Patch, M.D.    Medications:     . antiseptic oral rinse  15 mL Mouth Rinse BID  . darbepoetin (ARANESP) injection - DIALYSIS  40 mcg Intravenous Once  . darbepoetin (ARANESP) injection - DIALYSIS  40 mcg Intravenous Q Thu-HD  . imipenem-cilastatin  500 mg Intravenous Once  . levalbuterol  0.63 mg  Nebulization TID  . vancomycin  1,000 mg Intravenous Once  . DISCONTD: cefTAZidime (FORTAZ)  IV  2 g Intravenous Once     Assessment: S/P cardiac arrest, VDRF now with dysphagia/deconditioning and what appears to be aspiration+/- pneumonitis. Difficult situation with poor prognosis noted but family optimistic , and the plan is to continue with IV antibiotics for now because of persistent fevers, antibiotics will be administered per his dialysis schedule  2.ESRD: Plan for HD today later in the day per the patient's wishes. Appears that this will likely be on of the palliative care measure that he will continue upon discharge to [facility] when ready.  3. Anemia: Hgb Low, no overt losses- will restart ESA  4. CKD-MBD: Poor intake, off binders/VDRA.  5. Nutrition: Poor intake due to dysphagia/AMS.  6. Fever/Tachycardia: Suspected pneumonitis from aspiration. Would start antibiotics via dialysis and send blood cultures today.    LOS: 26 days   Jodie Cavey 01/08/2011, 12:55 PM

## 2011-01-08 NOTE — Progress Notes (Signed)
Patient is really ronchi and sats will not pick up. RT notified RN. RT oral suctioned patient making patient cough and removed a lot of bloody secretions.

## 2011-01-08 NOTE — Progress Notes (Signed)
Per progression meeting, patient is not ready to dc. CSW called SNF and updated them on patient's dc plans. SNF can accept patient on Friday 12/21 or Monday 12/24 if patient is medically ready. SNF is not accepting admissions on Saturday or Sunday (12/22 or 12/23) or Tuesday (12/25). CSW will continue to follow. [place], [place] [phone]

## 2011-01-09 LAB — GLUCOSE, CAPILLARY
Glucose-Capillary: 77 mg/dL (ref 70–99)
Glucose-Capillary: 84 mg/dL (ref 70–99)
Glucose-Capillary: 93 mg/dL (ref 70–99)

## 2011-01-09 LAB — CBC
HCT: 20 % — ABNORMAL LOW (ref 39.0–52.0)
Hemoglobin: 6.5 g/dL — CL (ref 13.0–17.0)
RDW: 17.5 % — ABNORMAL HIGH (ref 11.5–15.5)
WBC: 8.2 10*3/uL (ref 4.0–10.5)

## 2011-01-09 LAB — CULTURE, BLOOD (ROUTINE X 2): Culture  Setup Time: 201212151345

## 2011-01-09 MED ORDER — HEPARIN SODIUM (PORCINE) 1000 UNIT/ML DIALYSIS
40.0000 [IU]/kg | INTRAMUSCULAR | Status: DC | PRN
  Filled 2011-01-09: qty 3

## 2011-01-09 MED ORDER — HYDROMORPHONE HCL PF 1 MG/ML IJ SOLN
1.0000 mg | INTRAMUSCULAR | Status: DC | PRN
  Administered 2011-01-09 – 2011-01-12 (×2): 1 mg via INTRAVENOUS
  Filled 2011-01-09 (×2): qty 1

## 2011-01-09 NOTE — Progress Notes (Addendum)
Patient ID: Jackson [name], male   DOB: 09/09/1928, 77 y.o.   MRN: [id number]  S:Reports that he feels bad with some SOB this AM.   O: BP 125/75  Pulse 119  Temp(Src) 98.2 F (36.8 C) (Oral)  Resp 28  Ht 5\' 11"  (1.803 m)  Wt 62.3 kg (137 lb 5.6 oz)  BMI 19.16 kg/m2  SpO2 96%  BJY:NWGNFAO uncomfortable in bed, on O2 via VM ZHY:QMVHQ irregular tachycardia, S1 and S2 with ESM Resp:Coarse BS bilaterally, no wheeze ION:GEXB, flat, NT Ext:No LE edema      . antiseptic oral rinse  15 mL Mouth Rinse BID  . darbepoetin (ARANESP) injection - DIALYSIS  40 mcg Intravenous Once  . darbepoetin (ARANESP) injection - DIALYSIS  40 mcg Intravenous Q Thu-HD  . dextrose      . imipenem-cilastatin  250 mg Intravenous Q12H  . levalbuterol  0.63 mg Nebulization TID  . vancomycin  500 mg Intravenous Q T,Th,Sa-HD  . vancomycin  1,000 mg Intravenous Once    BMET  Lab 01/08/11 1339 01/06/11 0823 01/03/11 0709  NA 134* 133* 134*  K 4.4 3.6 3.8  CL 100 97 99  CO2 25 27 28   GLUCOSE 97 77 95  BUN 32* 26* 27*  CREATININE 7.49* 7.15* 6.73*  ALB -- -- --  CALCIUM 8.2* 8.3* 8.3*  PHOS 3.8 3.8 3.9   CBC  Lab 01/09/11 0705 01/08/11 1339 01/06/11 0823 01/03/11 0709  WBC 8.2 9.4 5.4 8.7  NEUTROABS -- -- -- --  HGB 6.5* 7.1* 8.4* 9.1*  HCT 20.0* 21.2* 25.0* 27.5*  MCV 85.5 85.5 85.0 86.8  PLT 133* 173 189 159   Assessment/Plan:  1.S/P cardiac arrest, VDRF now with dysphagia/deconditioning and what appears to be aspiration+/- pneumonitis. Difficult situation with poor prognosis noted but family optimistic and retracted comfort care approach.  2.ESRD: Plan for HD tomorrow (TTS schedule) per the patient's wishes. Appears that this will likely be one of the palliative care measure that he will continue upon discharge to [facility] when ready.  3. Anemia: Hgb Low, no overt losses- will restart ESA and plan for PRBCs on HD tomorrow  4. CKD-MBD: Poor intake, off binders/VDRA.  5. Nutrition: Poor  intake due to dysphagia/AMS.  6. Fever/Tachycardia: Suspected pneumonitis from aspiration. On Vancomycin/impenem.  Overall prognosis poor, however, patient wants to press on with current level of care. DNR  Jackson [name] K.  INTERVAL UPDATE: Informed by CSW that 2 of [name]'s children have refused for him to continue with dialysis or get any blood transfusion. Respective orders cancelled and will follow along to provide support/care as needed. Jackson Bills MD [facility]. Office # [phone] Pager # [phone] 3:22 PM

## 2011-01-09 NOTE — Progress Notes (Addendum)
Subjective: On a nonrebreather since midnight, he has diffuse mucopurulent secretions, febrile  Objective: Vital signs in last 24 hours: Filed Vitals:   01/09/11 0020 01/09/11 0215 01/09/11 0515 01/09/11 0928  BP:   125/75   Pulse:   119   Temp: 101.6 F (38.7 C)  98.2 F (36.8 C)   TempSrc: Axillary  Oral   Resp:   28   Height:      Weight:      SpO2:  99% 100% 96%    Intake/Output Summary (Last 24 hours) at 01/09/11 1229 Last data filed at 01/09/11 0513  Gross per 24 hour  Intake    220 ml  Output   1066 ml  Net   -846 ml    Weight change:   General: Alert, awake, and on a nonrebreather HEENT: No bruits, no goiter. Heart: Regular rate and rhythm, without murmurs, rubs, gallops. Lungs: By basilar rhonchi and wheezing Abdomen: Soft, nontender, nondistended, positive bowel sounds. Extremities: No clubbing cyanosis or edema with positive pedal pulses. Neuro: Grossly intact, nonfocal.    Lab Results: Results for orders placed during the hospital encounter of 15-Dec-2010 (from the past 24 hour(s))  CBC     Status: Abnormal   Collection Time   01/08/11  1:39 PM      Component Value Range   WBC 9.4  4.0 - 10.5 (K/uL)   RBC 2.48 (*) 4.22 - 5.81 (MIL/uL)   Hemoglobin 7.1 (*) 13.0 - 17.0 (g/dL)   HCT 96.0 (*) 45.4 - 52.0 (%)   MCV 85.5  78.0 - 100.0 (fL)   MCH 28.6  26.0 - 34.0 (pg)   MCHC 33.5  30.0 - 36.0 (g/dL)   RDW 09.8 (*) 11.9 - 15.5 (%)   Platelets 173  150 - 400 (K/uL)  RENAL FUNCTION PANEL     Status: Abnormal   Collection Time   01/08/11  1:39 PM      Component Value Range   Sodium 134 (*) 135 - 145 (mEq/L)   Potassium 4.4  3.5 - 5.1 (mEq/L)   Chloride 100  96 - 112 (mEq/L)   CO2 25  19 - 32 (mEq/L)   Glucose, Bld 97  70 - 99 (mg/dL)   BUN 32 (*) 6 - 23 (mg/dL)   Creatinine, Ser 1.47 (*) 0.50 - 1.35 (mg/dL)   Calcium 8.2 (*) 8.4 - 10.5 (mg/dL)   Phosphorus 3.8  2.3 - 4.6 (mg/dL)   Albumin 1.6 (*) 3.5 - 5.2 (g/dL)   GFR calc non Af Amer 6 (*) >90  (mL/min)   GFR calc Af Amer 7 (*) >90 (mL/min)  CULTURE, BLOOD (ROUTINE X 2)     Status: Normal (Preliminary result)   Collection Time   01/08/11  2:13 PM      Component Value Range   Specimen Description BLOOD HEMODIALYSIS LINE     Special Requests BOTTLES DRAWN AEROBIC AND ANAEROBIC 10CC     Setup Time [id number]     Culture       Value:        BLOOD CULTURE RECEIVED NO GROWTH TO DATE CULTURE WILL BE HELD FOR 5 DAYS BEFORE ISSUING A FINAL NEGATIVE REPORT   Report Status PENDING    CULTURE, BLOOD (ROUTINE X 2)     Status: Normal (Preliminary result)   Collection Time   01/08/11  2:35 PM      Component Value Range   Specimen Description BLOOD HEMODIALYSIS LINE     Special Requests  BOTTLES DRAWN AEROBIC AND ANAEROBIC 10CC     Setup Time [id number]     Culture       Value:        BLOOD CULTURE RECEIVED NO GROWTH TO DATE CULTURE WILL BE HELD FOR 5 DAYS BEFORE ISSUING A FINAL NEGATIVE REPORT   Report Status PENDING    GLUCOSE, CAPILLARY     Status: Normal   Collection Time   01/08/11  3:40 PM      Component Value Range   Glucose-Capillary 99  70 - 99 (mg/dL)  GLUCOSE, CAPILLARY     Status: Abnormal   Collection Time   01/08/11  4:56 PM      Component Value Range   Glucose-Capillary 52 (*) 70 - 99 (mg/dL)  GLUCOSE, CAPILLARY     Status: Abnormal   Collection Time   01/08/11  5:33 PM      Component Value Range   Glucose-Capillary 168 (*) 70 - 99 (mg/dL)   Comment 1 Notify RN    GLUCOSE, CAPILLARY     Status: Normal   Collection Time   01/08/11  8:03 PM      Component Value Range   Glucose-Capillary 81  70 - 99 (mg/dL)  GLUCOSE, CAPILLARY     Status: Normal   Collection Time   01/08/11 11:34 PM      Component Value Range   Glucose-Capillary 82  70 - 99 (mg/dL)   Comment 1 Notify RN    GLUCOSE, CAPILLARY     Status: Normal   Collection Time   01/09/11  4:25 AM      Component Value Range   Glucose-Capillary 77  70 - 99 (mg/dL)   Comment 1 Notify RN    CBC     Status:  Abnormal   Collection Time   01/09/11  7:05 AM      Component Value Range   WBC 8.2  4.0 - 10.5 (K/uL)   RBC 2.34 (*) 4.22 - 5.81 (MIL/uL)   Hemoglobin 6.5 (*) 13.0 - 17.0 (g/dL)   HCT 16.1 (*) 09.6 - 52.0 (%)   MCV 85.5  78.0 - 100.0 (fL)   MCH 27.8  26.0 - 34.0 (pg)   MCHC 32.5  30.0 - 36.0 (g/dL)   RDW 04.5 (*) 40.9 - 15.5 (%)   Platelets 133 (*) 150 - 400 (K/uL)  GLUCOSE, CAPILLARY     Status: Normal   Collection Time   01/09/11  8:30 AM      Component Value Range   Glucose-Capillary 82  70 - 99 (mg/dL)     Micro: Recent Results (from the past 240 hour(s))  CULTURE, BLOOD (ROUTINE X 2)     Status: Normal   Collection Time   01/03/11  8:40 AM      Component Value Range Status Comment   Specimen Description BLOOD HEMODIALYSIS CATHETER   Final    Special Requests     Final    Value: BOTTLES DRAWN AEROBIC AND ANAEROBIC PT ON [facility] PRIMAXCIN   Setup Time [id number]   Final    Culture NO GROWTH 5 DAYS   Final    Report Status 01/09/2011 FINAL   Final   CULTURE, BLOOD (ROUTINE X 2)     Status: Normal   Collection Time   01/03/11  9:05 AM      Component Value Range Status Comment   Specimen Description BLOOD HEMODIALYSIS CATHETER   Final    Special Requests BOTTLES DRAWN AEROBIC  AND ANAEROBIC   Final    Setup Time [id number]   Final    Culture NO GROWTH 5 DAYS   Final    Report Status 01/09/2011 FINAL   Final   CULTURE, BLOOD (ROUTINE X 2)     Status: Normal (Preliminary result)   Collection Time   01/07/11 12:51 PM      Component Value Range Status Comment   Specimen Description BLOOD LEFT HAND   Final    Special Requests BOTTLES DRAWN AEROBIC AND ANAEROBIC 5CC   Final    Setup Time [id number]   Final    Culture     Final    Value:        BLOOD CULTURE RECEIVED NO GROWTH TO DATE CULTURE WILL BE HELD FOR 5 DAYS BEFORE ISSUING A FINAL NEGATIVE REPORT   Report Status PENDING   Incomplete   CULTURE, BLOOD (ROUTINE X 2)     Status: Normal (Preliminary  result)   Collection Time   01/07/11  1:00 PM      Component Value Range Status Comment   Specimen Description BLOOD RIGHT HAND   Final    Special Requests BOTTLES DRAWN AEROBIC ONLY 5CC   Final    Setup Time [id number]   Final    Culture     Final    Value:        BLOOD CULTURE RECEIVED NO GROWTH TO DATE CULTURE WILL BE HELD FOR 5 DAYS BEFORE ISSUING A FINAL NEGATIVE REPORT   Report Status PENDING   Incomplete   CULTURE, BLOOD (ROUTINE X 2)     Status: Normal (Preliminary result)   Collection Time   01/08/11  2:13 PM      Component Value Range Status Comment   Specimen Description BLOOD HEMODIALYSIS LINE   Final    Special Requests BOTTLES DRAWN AEROBIC AND ANAEROBIC 10CC   Final    Setup Time [id number]   Final    Culture     Final    Value:        BLOOD CULTURE RECEIVED NO GROWTH TO DATE CULTURE WILL BE HELD FOR 5 DAYS BEFORE ISSUING A FINAL NEGATIVE REPORT   Report Status PENDING   Incomplete   CULTURE, BLOOD (ROUTINE X 2)     Status: Normal (Preliminary result)   Collection Time   01/08/11  2:35 PM      Component Value Range Status Comment   Specimen Description BLOOD HEMODIALYSIS LINE   Final    Special Requests BOTTLES DRAWN AEROBIC AND ANAEROBIC 10CC   Final    Setup Time [id number]   Final    Culture     Final    Value:        BLOOD CULTURE RECEIVED NO GROWTH TO DATE CULTURE WILL BE HELD FOR 5 DAYS BEFORE ISSUING A FINAL NEGATIVE REPORT   Report Status PENDING   Incomplete     Studies/Results: No results found.  Medications:     . antiseptic oral rinse  15 mL Mouth Rinse BID  . darbepoetin (ARANESP) injection - DIALYSIS  40 mcg Intravenous Once  . darbepoetin (ARANESP) injection - DIALYSIS  40 mcg Intravenous Q Thu-HD  . dextrose      . imipenem-cilastatin  250 mg Intravenous Q12H  . levalbuterol  0.63 mg Nebulization TID  . vancomycin  500 mg Intravenous Q T,Th,Sa-HD  . vancomycin  1,000 mg Intravenous Once    Assessment: 1 status post cardiac arrest,  VDRF now  with dysphagia/deconditioning and what appears to be aspiration+/- pneumonitis. Difficult situation with poor prognosis noted but family optimistic , and the plan is to continue with IV antibiotics for now because of persistent fevers, antibiotics will be administered per his dialysis schedule . Discussed futility of treatment with the daughter yesterday, she wants to continue with all current measures. Currently on a nonrebreather. The patient may not survive this hospitalization. The daughter was also notified about this.  2.ESRD: Plan for HD today later in the day per the patient's wishes. Appears that this will likely be on of the palliative care measure that he will continue upon discharge to [facility] when ready.  3. Anemia: Hgb Low, no overt losses- will restart ESA  4. CKD-MBD: Poor intake, off binders/VDRA.  5. Nutrition: Poor intake due to dysphagia/AMS.  6. Fever/Tachycardia: Suspected pneumonitis from aspiration. Would start antibiotics via dialysis if cultures negative to date   LOS: 27 days   Jackson [name] 01/09/2011, 12:29 PM

## 2011-01-09 NOTE — Progress Notes (Signed)
CRITICAL VALUE ALERT  Critical value received: Hgb 6.5   Date of notification:  01/09/11  Time of notification: 0755  Critical value read back:yes  Hgb 6.5  Nurse who received alert:  Morrison Old RN  MD notified (1st page): Dr.Abrol  Time of first page:  0755  MD notified (2nd page):  Time of second page:  Responding MD: Dr. Susie Cassette  Time MD responded: 0800

## 2011-01-09 NOTE — Progress Notes (Signed)
Per progression meeting, patient is still not ready for dc. CSW updated SNF on patient's dc plans. CSW will continue to follow to support family and assist with dc needs. [place], [place] [phone]

## 2011-01-09 NOTE — Progress Notes (Signed)
Daughter Cynethia  was at bedside and wanted it to be known she did not want anymore dialysis or blood transfusion. She felt it was too much for her father. She had brother Euan on the phone to confirm and he will be up later. Dr. Collier Flowers and Dr. Allena Katz notified. Dr. Allena Katz will withdraw these for plan of care.

## 2011-01-10 LAB — GLUCOSE, CAPILLARY
Glucose-Capillary: 104 mg/dL — ABNORMAL HIGH (ref 70–99)
Glucose-Capillary: 76 mg/dL (ref 70–99)
Glucose-Capillary: 76 mg/dL (ref 70–99)
Glucose-Capillary: 79 mg/dL (ref 70–99)
Glucose-Capillary: 88 mg/dL (ref 70–99)
Glucose-Capillary: 89 mg/dL (ref 70–99)

## 2011-01-10 MED ORDER — LEVALBUTEROL HCL 0.63 MG/3ML IN NEBU
0.6300 mg | INHALATION_SOLUTION | Freq: Four times a day (QID) | RESPIRATORY_TRACT | Status: DC | PRN
Start: 2011-01-10 — End: 2011-01-13
  Filled 2011-01-10: qty 3

## 2011-01-10 NOTE — Progress Notes (Signed)
Subjective: Barely arousable  Objective: Vital signs in last 24 hours: Filed Vitals:   01/09/11 0928 01/09/11 2204 01/10/11 0500 01/10/11 0907  BP:   113/78   Pulse:   132   Temp:   103.6 F (39.8 C)   TempSrc:   Oral   Resp:   29   Height:      Weight:      SpO2: 96% 94% 94% 97%    Intake/Output Summary (Last 24 hours) at 01/10/11 1107 Last data filed at 01/09/11 1700  Gross per 24 hour  Intake      0 ml  Output      0 ml  Net      0 ml    Weight change:   General: Barely arousable, somnolent HEENT: No bruits, no goiter. Heart: Regular rate and rhythm, without murmurs, rubs, gallops. Lungs: Clear to auscultation bilaterally. Abdomen: Soft, nontender, nondistended, positive bowel sounds. Extremities: No clubbing cyanosis or edema with positive pedal pulses. Neuro: Unable to cooperate with a full exam   Lab Results: Results for orders placed during the hospital encounter of 12/08/2010 (from the past 24 hour(s))  GLUCOSE, CAPILLARY     Status: Normal   Collection Time   01/09/11 12:32 PM      Component Value Range   Glucose-Capillary 84  70 - 99 (mg/dL)  GLUCOSE, CAPILLARY     Status: Normal   Collection Time   01/09/11  3:57 PM      Component Value Range   Glucose-Capillary 78  70 - 99 (mg/dL)  GLUCOSE, CAPILLARY     Status: Normal   Collection Time   01/09/11  7:45 PM      Component Value Range   Glucose-Capillary 93  70 - 99 (mg/dL)  GLUCOSE, CAPILLARY     Status: Normal   Collection Time   01/10/11 12:19 AM      Component Value Range   Glucose-Capillary 76  70 - 99 (mg/dL)  GLUCOSE, CAPILLARY     Status: Normal   Collection Time   01/10/11  3:55 AM      Component Value Range   Glucose-Capillary 79  70 - 99 (mg/dL)  GLUCOSE, CAPILLARY     Status: Normal   Collection Time   01/10/11  7:45 AM      Component Value Range   Glucose-Capillary 89  70 - 99 (mg/dL)     Micro: Recent Results (from the past 240 hour(s))  CULTURE, BLOOD (ROUTINE X 2)      Status: Normal   Collection Time   01/03/11  8:40 AM      Component Value Range Status Comment   Specimen Description BLOOD HEMODIALYSIS CATHETER   Final    Special Requests     Final    Value: BOTTLES DRAWN AEROBIC AND ANAEROBIC PT ON [facility] PRIMAXCIN   Setup Time [id number]   Final    Culture NO GROWTH 5 DAYS   Final    Report Status 01/09/2011 FINAL   Final   CULTURE, BLOOD (ROUTINE X 2)     Status: Normal   Collection Time   01/03/11  9:05 AM      Component Value Range Status Comment   Specimen Description BLOOD HEMODIALYSIS CATHETER   Final    Special Requests BOTTLES DRAWN AEROBIC AND ANAEROBIC   Final    Setup Time [id number]   Final    Culture NO GROWTH 5 DAYS   Final    Report  Status 01/09/2011 FINAL   Final   CULTURE, BLOOD (ROUTINE X 2)     Status: Normal (Preliminary result)   Collection Time   01/07/11 12:51 PM      Component Value Range Status Comment   Specimen Description BLOOD LEFT HAND   Final    Special Requests BOTTLES DRAWN AEROBIC AND ANAEROBIC 5CC   Final    Setup Time [id number]   Final    Culture     Final    Value:        BLOOD CULTURE RECEIVED NO GROWTH TO DATE CULTURE WILL BE HELD FOR 5 DAYS BEFORE ISSUING A FINAL NEGATIVE REPORT   Report Status PENDING   Incomplete   CULTURE, BLOOD (ROUTINE X 2)     Status: Normal (Preliminary result)   Collection Time   01/07/11  1:00 PM      Component Value Range Status Comment   Specimen Description BLOOD RIGHT HAND   Final    Special Requests BOTTLES DRAWN AEROBIC ONLY 5CC   Final    Setup Time [id number]   Final    Culture     Final    Value:        BLOOD CULTURE RECEIVED NO GROWTH TO DATE CULTURE WILL BE HELD FOR 5 DAYS BEFORE ISSUING A FINAL NEGATIVE REPORT   Report Status PENDING   Incomplete   CULTURE, BLOOD (ROUTINE X 2)     Status: Normal (Preliminary result)   Collection Time   01/08/11  2:13 PM      Component Value Range Status Comment   Specimen Description BLOOD HEMODIALYSIS LINE    Final    Special Requests BOTTLES DRAWN AEROBIC AND ANAEROBIC 10CC   Final    Setup Time [id number]   Final    Culture     Final    Value:        BLOOD CULTURE RECEIVED NO GROWTH TO DATE CULTURE WILL BE HELD FOR 5 DAYS BEFORE ISSUING A FINAL NEGATIVE REPORT   Report Status PENDING   Incomplete   CULTURE, BLOOD (ROUTINE X 2)     Status: Normal (Preliminary result)   Collection Time   01/08/11  2:35 PM      Component Value Range Status Comment   Specimen Description BLOOD HEMODIALYSIS LINE   Final    Special Requests BOTTLES DRAWN AEROBIC AND ANAEROBIC 10CC   Final    Setup Time [id number]   Final    Culture     Final    Value:        BLOOD CULTURE RECEIVED NO GROWTH TO DATE CULTURE WILL BE HELD FOR 5 DAYS BEFORE ISSUING A FINAL NEGATIVE REPORT   Report Status PENDING   Incomplete     Studies/Results: No results found.  Medications:     . antiseptic oral rinse  15 mL Mouth Rinse BID  . imipenem-cilastatin  250 mg Intravenous Q12H  . levalbuterol  0.63 mg Nebulization TID  . vancomycin  500 mg Intravenous Q T,Th,Sa-HD  . DISCONTD: darbepoetin (ARANESP) injection - DIALYSIS  40 mcg Intravenous Q Thu-HD     Assessment: Active Problems:  Cardiopulmonary arrest  ESRD (end stage renal disease) on dialysis  Hypokalemia  Anemia  Thrombocytopenia  Hypertension  Hypoglycemia   Plan: Continues to be febrile due to aspiration pneumonia. A daughter normal blood transfusions normal hemodialysis. This has been conveyed to Dr. Allena Katz. Patient is anticipated to pass away tonight, over the next 24-48 hours.   LOS:  28 days   Jackson [name] 01/10/2011, 11:07 AM

## 2011-01-10 NOTE — Progress Notes (Signed)
Subjective:  Poorly responsive  Objective:    Vital signs in last 24 hours: Filed Vitals:   01/09/11 0928 01/09/11 2204 01/10/11 0500 01/10/11 0907  BP:   113/78   Pulse:   132   Temp:   103.6 F (39.8 C)   TempSrc:   Oral   Resp:   29   Height:      Weight:      SpO2: 96% 94% 94% 97%   Weight change:   Intake/Output Summary (Last 24 hours) at 01/10/11 1453 Last data filed at 01/09/11 1700  Gross per 24 hour  Intake      0 ml  Output      0 ml  Net      0 ml   Labs: Basic Metabolic Panel:  Lab 01/08/11 9604 01/06/11 0823  NA 134* 133*  K 4.4 3.6  CL 100 97  CO2 25 27  GLUCOSE 97 77  BUN 32* 26*  CREATININE 7.49* 7.15*  ALB -- --  CALCIUM 8.2* 8.3*  PHOS 3.8 3.8   Liver Function Tests:  Lab 01/08/11 1339 01/06/11 0823  AST -- --  ALT -- --  ALKPHOS -- --  BILITOT -- --  PROT -- --  ALBUMIN 1.6* 1.6*   No results found for this basename: LIPASE:3,AMYLASE:3 in the last 168 hours No results found for this basename: AMMONIA:3 in the last 168 hours CBC:  Lab 01/09/11 0705 01/08/11 1339 01/06/11 0823  WBC 8.2 9.4 5.4  NEUTROABS -- -- --  HGB 6.5* 7.1* 8.4*  HCT 20.0* 21.2* 25.0*  MCV 85.5 85.5 85.0  PLT 133* 173 189   Cardiac Enzymes: No results found for this basename: CKTOTAL:5,CKMB:5,CKMBINDEX:5,TROPONINI:5 in the last 168 hours CBG:  Lab 01/10/11 1143 01/10/11 0745 01/10/11 0355 01/10/11 0019 01/09/11 1945  GLUCAP 88 89 79 76 93    Iron Studies: No results found for this basename: IRON:30,TIBC:30,SATURATION RATIOS:30,TRANSFERRIN:30,FERRITIN:30 in the last 168 hours Studies/Results: No results found. Medications:    . D-10-0.45% Sodium Chloride with KCL 40 meq/L 1000 ml 30 mL/hr at 01/09/11 0513  . dextrose 20 mL/hr at 01/09/11 0025      . antiseptic oral rinse  15 mL Mouth Rinse BID  . imipenem-cilastatin  250 mg Intravenous Q12H  . vancomycin  500 mg Intravenous Q T,Th,Sa-HD  . DISCONTD: darbepoetin (ARANESP) injection - DIALYSIS  40  mcg Intravenous Q Thu-HD  . DISCONTD: levalbuterol  0.63 mg Nebulization TID    I  have reviewed scheduled and prn medications.  Physical Exam: General: very weak, not reponsding Heart: regular Lungs: coarse BS Abdomen: soft Extremities: no rash  Problem/Plan: 1. ESRD- no further dialysis, family requested due to worsening health condition.  No further suggestions-  pt is DNR and will likely succumb from uremia in a matter of days.  Please call with any questions.     Vinson Moselle, MD [facility] [phone] pager   [phone] cell 01/10/2011, 2:53 PM

## 2011-01-11 LAB — GLUCOSE, CAPILLARY
Glucose-Capillary: 63 mg/dL — ABNORMAL LOW (ref 70–99)
Glucose-Capillary: 81 mg/dL (ref 70–99)
Glucose-Capillary: 84 mg/dL (ref 70–99)
Glucose-Capillary: 89 mg/dL (ref 70–99)

## 2011-01-11 MED ORDER — DEXTROSE 50 % IV SOLN
INTRAVENOUS | Status: AC
  Administered 2011-01-11: 50 mL
  Filled 2011-01-11: qty 50

## 2011-01-11 NOTE — Progress Notes (Signed)
Subjective: Somnolent and lethargic  Objective: Vital signs in last 24 hours: Filed Vitals:   01/09/11 2204 01/10/11 0500 01/10/11 0907 01/11/11 0620  BP:  113/78  127/82  Pulse:  132  117  Temp:  103.6 F (39.8 C)  100.3 F (37.9 C)  TempSrc:  Oral  Axillary  Resp:  29  28  Height:      Weight:      SpO2: 94% 94% 97% 100%    Intake/Output Summary (Last 24 hours) at 01/11/11 1029 Last data filed at 01/11/11 0700  Gross per 24 hour  Intake    480 ml  Output      0 ml  Net    480 ml    Weight change:  General: Barely arousable, somnolent  HEENT: No bruits, no goiter.  Heart: Regular rate and rhythm, without murmurs, rubs, gallops.  Lungs: Clear to auscultation bilaterally.  Abdomen: Soft, nontender, nondistended, positive bowel sounds.  Extremities: No clubbing cyanosis or edema with positive pedal pulses.  Neuro: Unable to cooperate with a full exam    Lab Results: Results for orders placed during the hospital encounter of 01/03/2011 (from the past 24 hour(s))  GLUCOSE, CAPILLARY     Status: Normal   Collection Time   01/10/11 11:43 AM      Component Value Range   Glucose-Capillary 88  70 - 99 (mg/dL)  GLUCOSE, CAPILLARY     Status: Normal   Collection Time   01/10/11  5:16 PM      Component Value Range   Glucose-Capillary 76  70 - 99 (mg/dL)  GLUCOSE, CAPILLARY     Status: Abnormal   Collection Time   01/10/11  7:53 PM      Component Value Range   Glucose-Capillary 104 (*) 70 - 99 (mg/dL)   Comment 1 Notify RN    GLUCOSE, CAPILLARY     Status: Normal   Collection Time   01/11/11 12:12 AM      Component Value Range   Glucose-Capillary 81  70 - 99 (mg/dL)   Comment 1 Notify RN    GLUCOSE, CAPILLARY     Status: Normal   Collection Time   01/11/11  4:14 AM      Component Value Range   Glucose-Capillary 84  70 - 99 (mg/dL)   Comment 1 Notify RN    GLUCOSE, CAPILLARY     Status: Abnormal   Collection Time   01/11/11  8:33 AM      Component Value Range   Glucose-Capillary 63 (*) 70 - 99 (mg/dL)     Micro: Recent Results (from the past 240 hour(s))  CULTURE, BLOOD (ROUTINE X 2)     Status: Normal   Collection Time   01/03/11  8:40 AM      Component Value Range Status Comment   Specimen Description BLOOD HEMODIALYSIS CATHETER   Final    Special Requests     Final    Value: BOTTLES DRAWN AEROBIC AND ANAEROBIC PT ON [facility] PRIMAXCIN   Setup Time [id number]   Final    Culture NO GROWTH 5 DAYS   Final    Report Status 01/09/2011 FINAL   Final   CULTURE, BLOOD (ROUTINE X 2)     Status: Normal   Collection Time   01/03/11  9:05 AM      Component Value Range Status Comment   Specimen Description BLOOD HEMODIALYSIS CATHETER   Final    Special Requests BOTTLES DRAWN AEROBIC AND  ANAEROBIC   Final    Setup Time [id number]   Final    Culture NO GROWTH 5 DAYS   Final    Report Status 01/09/2011 FINAL   Final   CULTURE, BLOOD (ROUTINE X 2)     Status: Normal (Preliminary result)   Collection Time   01/07/11 12:51 PM      Component Value Range Status Comment   Specimen Description BLOOD LEFT HAND   Final    Special Requests BOTTLES DRAWN AEROBIC AND ANAEROBIC 5CC   Final    Setup Time [id number]   Final    Culture     Final    Value:        BLOOD CULTURE RECEIVED NO GROWTH TO DATE CULTURE WILL BE HELD FOR 5 DAYS BEFORE ISSUING A FINAL NEGATIVE REPORT   Report Status PENDING   Incomplete   CULTURE, BLOOD (ROUTINE X 2)     Status: Normal (Preliminary result)   Collection Time   01/07/11  1:00 PM      Component Value Range Status Comment   Specimen Description BLOOD RIGHT HAND   Final    Special Requests BOTTLES DRAWN AEROBIC ONLY 5CC   Final    Setup Time [id number]   Final    Culture     Final    Value:        BLOOD CULTURE RECEIVED NO GROWTH TO DATE CULTURE WILL BE HELD FOR 5 DAYS BEFORE ISSUING A FINAL NEGATIVE REPORT   Report Status PENDING   Incomplete   CULTURE, BLOOD (ROUTINE X 2)     Status: Normal (Preliminary  result)   Collection Time   01/08/11  2:13 PM      Component Value Range Status Comment   Specimen Description BLOOD HEMODIALYSIS LINE   Final    Special Requests BOTTLES DRAWN AEROBIC AND ANAEROBIC 10CC   Final    Setup Time [id number]   Final    Culture     Final    Value:        BLOOD CULTURE RECEIVED NO GROWTH TO DATE CULTURE WILL BE HELD FOR 5 DAYS BEFORE ISSUING A FINAL NEGATIVE REPORT   Report Status PENDING   Incomplete   CULTURE, BLOOD (ROUTINE X 2)     Status: Normal (Preliminary result)   Collection Time   01/08/11  2:35 PM      Component Value Range Status Comment   Specimen Description BLOOD HEMODIALYSIS LINE   Final    Special Requests BOTTLES DRAWN AEROBIC AND ANAEROBIC 10CC   Final    Setup Time [id number]   Final    Culture     Final    Value:        BLOOD CULTURE RECEIVED NO GROWTH TO DATE CULTURE WILL BE HELD FOR 5 DAYS BEFORE ISSUING A FINAL NEGATIVE REPORT   Report Status PENDING   Incomplete     Studies/Results: No results found.  Medications:     . antiseptic oral rinse  15 mL Mouth Rinse BID  . dextrose      . DISCONTD: imipenem-cilastatin  250 mg Intravenous Q12H  . DISCONTD: levalbuterol  0.63 mg Nebulization TID  . DISCONTD: vancomycin  500 mg Intravenous Q T,Th,Sa-HD     Assessment: Active Problems:  Cardiopulmonary arrest  ESRD (end stage renal disease) on dialysis  Hypokalemia  Anemia  Thrombocytopenia  Hypertension  Hypoglycemia   Plan:  Continue comfort care measures. Comfort feeding and the patient desires.  LOS: 29 days   Jackson [name] 01/11/2011, 10:29 AM

## 2011-01-12 LAB — GLUCOSE, CAPILLARY: Glucose-Capillary: 89 mg/dL (ref 70–99)

## 2011-01-13 LAB — CULTURE, BLOOD (ROUTINE X 2)
Culture  Setup Time: 201212192140
Culture: NO GROWTH

## 2011-01-13 NOTE — Progress Notes (Signed)
12?24/2012 21:30 Respirations cease, no apical or carotid pulses, pupils fixed and nonreactive. Pt has pooling of blood on back and buttocks. Pt pronounced at 21:30 with family at bedside. Emotional support shared with family. Jackson [name]

## 2011-01-14 LAB — CULTURE, BLOOD (ROUTINE X 2)
Culture  Setup Time: 201212202213
Culture  Setup Time: 201212202213
Culture: NO GROWTH

## 2011-01-20 NOTE — Progress Notes (Signed)
No change since yesterday the patient appears to be somnolent. May pass away in 24-48 hours. I have minimized his IV fluids as well as his antihypertensive medications.

## 2011-01-20 DEATH — deceased

## 2011-02-12 ENCOUNTER — Telehealth: Payer: Self-pay | Admitting: Cardiovascular Disease

## 2011-02-20 NOTE — Discharge Summary (Signed)
Death summary   Status postcardiac arrest due to ventricular fibrillation  Chronic systolic heart failure  09/06/2009  .  Rhabdomyolysis  .  Renal disease  .  Cardiomyopathy  .  HTN (hypertension)  .  Hyperlipemia  .  Anemia  .  Gout  .  Tricuspid regurgitation  .  Thrombus  .  Hyperkalemia  .  Thyroid disease  .  Cancer  prostate  .  COPD (chronic obstructive pulmonary disease)  .  CHF (congestive heart failure)  .  GERD (gastroesophageal reflux disease)    Presenting complaints/history of present illness  78 y/o AAM with ESRD on HD who had dialysis this morning and had syncope at home. According to EMS, the patient had AED applied by fire which advised a shockable rhythm. He was shocked one time. EMS did give him 1 mg of epinephrine through a left eye oh. He was intubated orally at the scene. He also had an EJ established at the scene as well. Sinus rhythm with multiple premature beats was established with palpable blood pressure. The patient has been somewhat agitated but is not responding purposefully. Other review of systems in pertinent history is not obtainable from the patient.    hospital course  #1 status post cardiac arrest, VDRF now with dysphagia/deconditioning and what appears to be aspiration+/- pneumonitis. Patient continued to have overt  aspiration despite antibiotics, multiple courses of antibiotics were administered during this hospitalization, finally the issue the family decided to discontinue with IV antibiotics   Discussed futility of treatment with the daughter multiple times, during this hospitalization, she wants to continue with all comfort care measures. He had to be placed on a nonrebreather multiple times post extubation  2.ESRD: Again during this hospitalization the patient was continued on dialysis and then taken off dialysis on several different occasions based on the family decision that they need from day-to-day. Ultimately the family  decided to stop hemodialysis and they understood the futility of treatment of continued hemodialysis  3. Anemia: Hgb Low, this was treated with aranesp 4. CKD-MBD: Poor intake, off binders/VDRA.  5. Nutrition: Poor intake due to dysphagia/AMS.  6. Fever/Tachycardia: These were persistent. Patient expired on December 24.Respirations cease, no apical or carotid pulses, pupils fixed and nonreactive. Pt has pooling of blood on back and buttocks. Pt pronounced at 21:30 by J. Fairchild,RN  with family at bedside. Emotional support shared with family.
